# Patient Record
Sex: Female | Born: 1955 | Race: Black or African American | Hispanic: No | Marital: Married | Smoking: Former smoker
Health system: Southern US, Community
[De-identification: ages and names within clinical notes are randomized; demographics above are authoritative.]

## PROBLEM LIST (undated history)

## (undated) DIAGNOSIS — N8110 Cystocele, unspecified: Secondary | ICD-10-CM

## (undated) DIAGNOSIS — R519 Headache, unspecified: Secondary | ICD-10-CM

## (undated) DIAGNOSIS — E785 Hyperlipidemia, unspecified: Secondary | ICD-10-CM

## (undated) DIAGNOSIS — I10 Essential (primary) hypertension: Secondary | ICD-10-CM

## (undated) DIAGNOSIS — M1990 Unspecified osteoarthritis, unspecified site: Secondary | ICD-10-CM

## (undated) DIAGNOSIS — Z8709 Personal history of other diseases of the respiratory system: Secondary | ICD-10-CM

## (undated) DIAGNOSIS — R51 Headache: Secondary | ICD-10-CM

## (undated) DIAGNOSIS — Z8701 Personal history of pneumonia (recurrent): Secondary | ICD-10-CM

## (undated) DIAGNOSIS — J189 Pneumonia, unspecified organism: Secondary | ICD-10-CM

## (undated) DIAGNOSIS — F419 Anxiety disorder, unspecified: Secondary | ICD-10-CM

## (undated) DIAGNOSIS — E119 Type 2 diabetes mellitus without complications: Secondary | ICD-10-CM

## (undated) DIAGNOSIS — I499 Cardiac arrhythmia, unspecified: Secondary | ICD-10-CM

## (undated) DIAGNOSIS — K219 Gastro-esophageal reflux disease without esophagitis: Secondary | ICD-10-CM

## (undated) DIAGNOSIS — D649 Anemia, unspecified: Secondary | ICD-10-CM

## (undated) DIAGNOSIS — N189 Chronic kidney disease, unspecified: Secondary | ICD-10-CM

## (undated) DIAGNOSIS — I2510 Atherosclerotic heart disease of native coronary artery without angina pectoris: Secondary | ICD-10-CM

## (undated) HISTORY — PX: TUBAL LIGATION: SHX77

## (undated) HISTORY — PX: OTHER SURGICAL HISTORY: SHX169

## (undated) HISTORY — DX: Atherosclerotic heart disease of native coronary artery without angina pectoris: I25.10

## (undated) HISTORY — PX: COLONOSCOPY: SHX174

---

## 1990-05-29 HISTORY — PX: ABDOMINAL HYSTERECTOMY: SHX81

## 2014-07-15 NOTE — Progress Notes (Signed)
Please put orders in Epic surgery 07-30-14 pre op 07-21-14 Thanks

## 2014-07-20 ENCOUNTER — Ambulatory Visit: Payer: Self-pay | Admitting: Orthopedic Surgery

## 2014-07-20 MED ORDER — DEXAMETHASONE SODIUM PHOSPHATE 4 MG/ML IJ SOLN: 4.0000 mg | Freq: Once | INTRAMUSCULAR | Status: AC

## 2014-07-20 MED ORDER — SODIUM CHLORIDE 0.9 % IV SOLN: 4.0000 mg | Freq: Once | INTRAVENOUS | Status: AC

## 2014-07-20 MED ORDER — SODIUM CHLORIDE 0.9 % IV SOLN: 1000.0000 mg | INTRAVENOUS | Status: AC

## 2014-07-20 NOTE — Patient Instructions (Addendum)
Cassidy MartinetSusan Green  07/20/2014   Your procedure is scheduled on: 07/30/14   [facility] and follow signs to               Short Stay Center at 11:00 AM.   Call this number if you have problems the morning of surgery [phone]   Remember:  Do not eat food :After Midnight.             MAY HAVE CLEAR LIQUIDS UNTIL 7:00 AM    CLEAR LIQUID DIET   Foods Allowed                                                                     Foods Excluded  Coffee and tea, regular and decaf                             liquids that you cannot  Plain Jell-O in any flavor                                             see through such as: Fruit ices (not with fruit pulp)                                     milk, soups, orange juice  Iced Popsicles                                                All solid food Carbonated beverages, regular and diet                                    Cranberry, grape and apple juices Sports drinks like Gatorade Lightly seasoned clear broth or consume(fat free) Sugar, honey syrup _____________________________________________________________________       Take these medicines the morning of surgery with A SIP OF WATER: MAY TAKE HYDROCODONE OR XANAX IF NEEDED                                You may not have any metal on your body including hair pins and              piercings  Do not wear jewelry, make-up, lotions, powders or perfumes.             Do not wear nail polish.  Do not shave  48 hours prior to surgery.              Men may shave face and neck.   Do not bring valuables to the hospital. [facility] IS NOT             RESPONSIBLE  FOR VALUABLES.  Contacts, dentures or bridgework may not be worn into surgery.  Leave suitcase in the car. After surgery it may be brought to your room.     Patients discharged the day of surgery will not be allowed to drive home.  Name and phone number of your driver:  Special  Instructions: N/A              Please read over the following fact sheets you were given: _____________________________________________________________________                                                     [place]  Before surgery, you can play an important role.  Because skin is not sterile, your skin needs to be as free of germs as possible.  You can reduce the number of germs on your skin by washing with CHG (chlorahexidine gluconate) soap before surgery.  CHG is an antiseptic cleaner which kills germs and bonds with the skin to continue killing germs even after washing. Please DO NOT use if you have an allergy to CHG or antibacterial soaps.  If your skin becomes reddened/irritated stop using the CHG and inform your nurse when you arrive at Short Stay. Do not shave (including legs and underarms) for at least 48 hours prior to the first CHG shower.  You may shave your face. Please follow these instructions carefully:   1.  Shower with CHG Soap the night before surgery and the  morning of Surgery.   2.  If you choose to wash your hair, wash your hair first as usual with your  normal  Shampoo.   3.  After you shampoo, rinse your hair and body thoroughly to remove the  shampoo.                                         4.  Use CHG as you would any other liquid soap.  You can apply chg directly  to the skin and wash . Gently wash with scrungie or clean wascloth    5.  Apply the CHG Soap to your body ONLY FROM THE NECK DOWN.   Do not use on open                           Wound or open sores. Avoid contact with eyes, ears mouth and genitals (private parts).                        Genitals (private parts) with your normal soap.              6.  Wash thoroughly, paying special attention to the area where your surgery  will be performed.   7.  Thoroughly rinse your body with warm water from the neck down.   8.  DO NOT shower/wash with your normal soap after  using and rinsing off  the CHG Soap .                9.  Pat yourself dry with a clean towel.             10.  Wear  clean pajamas.             11.  Place clean sheets on your bed the night of your first shower and do not  sleep with pets.  Day of Surgery : Do not apply any lotions/deodorants the morning of surgery.  Please wear clean clothes to the hospital/surgery center.  FAILURE TO FOLLOW THESE INSTRUCTIONS MAY RESULT IN THE CANCELLATION OF YOUR SURGERY    PATIENT SIGNATURE_________________________________  ______________________________________________________________________     Cassidy Green  An incentive spirometer is a tool that can help keep your lungs clear and active. This tool measures how well you are filling your lungs with each breath. Taking long deep breaths may help reverse or decrease the chance of developing breathing (pulmonary) problems (especially infection) following:  A long period of time when you are unable to move or be active. BEFORE THE PROCEDURE   If the spirometer includes an indicator to show your best effort, your nurse or respiratory therapist will set it to a desired goal.  If possible, sit up straight or lean slightly forward. Try not to slouch.  Hold the incentive spirometer in an upright position. INSTRUCTIONS FOR USE   Sit on the edge of your bed if possible, or sit up as far as you can in bed or on a chair.  Hold the incentive spirometer in an upright position.  Breathe out normally.  Place the mouthpiece in your mouth and seal your lips tightly around it.  Breathe in slowly and as deeply as possible, raising the piston or the ball toward the top of the column.  Hold your breath for 3-5 seconds or for as long as possible. Allow the piston or ball to fall to the bottom of the column.  Remove the mouthpiece from your mouth and breathe out normally.  Rest for a few seconds and repeat Steps 1 through 7 at least 10 times  every 1-2 hours when you are awake. Take your time and take a few normal breaths between deep breaths.  The spirometer may include an indicator to show your best effort. Use the indicator as a goal to work toward during each repetition.  After each set of 10 deep breaths, practice coughing to be sure your lungs are clear. If you have an incision (the cut made at the time of surgery), support your incision when coughing by placing a pillow or rolled up towels firmly against it. Once you are able to get out of bed, walk around indoors and cough well. You may stop using the incentive spirometer when instructed by your caregiver.  RISKS AND COMPLICATIONS  Take your time so you do not get dizzy or light-headed.  If you are in pain, you may need to take or ask for pain medication before doing incentive spirometry. It is harder to take a deep breath if you are having pain. AFTER USE  Rest and breathe slowly and easily.  It can be helpful to keep track of a log of your progress. Your caregiver can provide you with a simple table to help with this. If you are using the spirometer at home, follow these instructions: [place] IF:   You are having difficultly using the spirometer.  You have trouble using the spirometer as often as instructed.  Your pain medication is not giving enough relief while using the spirometer.  You develop fever of 100.5 F (38.1 C) or higher. SEEK IMMEDIATE MEDICAL CARE IF:   You cough up bloody  sputum that had not been present before.  You develop fever of 102 F (38.9 C) or greater.  You develop worsening pain at or near the incision site. MAKE SURE YOU:   Understand these instructions.  Will watch your condition.  Will get help right away if you are not doing well or get worse. Document Released: 09/25/2006 Document Revised: 08/07/2011 Document Reviewed: 11/26/2006 ExitCare Patient Information 2014 ExitCare,  [place].   ________________________________________________________________________  WHAT IS A BLOOD TRANSFUSION? Blood Transfusion Information  A transfusion is the replacement of blood or some of its parts. Blood is made up of multiple cells which provide different functions.  Red blood cells carry oxygen and are used for blood loss replacement.  White blood cells fight against infection.  Platelets control bleeding.  Plasma helps clot blood.  Other blood products are available for specialized needs, such as hemophilia or other clotting disorders. BEFORE THE TRANSFUSION  Who gives blood for transfusions?   Healthy volunteers who are fully evaluated to make sure their blood is safe. This is blood bank blood. Transfusion therapy is the safest it has ever been in the practice of medicine. Before blood is taken from a donor, a complete history is taken to make sure that person has no history of diseases nor engages in risky social behavior (examples are intravenous drug use or sexual activity with multiple partners). The donor's travel history is screened to minimize risk of transmitting infections, such as malaria. The donated blood is tested for signs of infectious diseases, such as HIV and hepatitis. The blood is then tested to be sure it is compatible with you in order to minimize the chance of a transfusion reaction. If you or a relative donates blood, this is often done in anticipation of surgery and is not appropriate for emergency situations. It takes many days to process the donated blood. RISKS AND COMPLICATIONS Although transfusion therapy is very safe and saves many lives, the main dangers of transfusion include:   Getting an infectious disease.  Developing a transfusion reaction. This is an allergic reaction to something in the blood you were given. Every precaution is taken to prevent this. The decision to have a blood transfusion has been considered carefully by your caregiver  before blood is given. Blood is not given unless the benefits outweigh the risks. AFTER THE TRANSFUSION  Right after receiving a blood transfusion, you will usually feel much better and more energetic. This is especially true if your red blood cells have gotten low (anemic). The transfusion raises the level of the red blood cells which carry oxygen, and this usually causes an energy increase.  The nurse administering the transfusion will monitor you carefully for complications. HOME CARE INSTRUCTIONS  No special instructions are needed after a transfusion. You may find your energy is better. Speak with your caregiver about any limitations on activity for underlying diseases you may have. SEEK MEDICAL CARE IF:   Your condition is not improving after your transfusion.  You develop redness or irritation at the intravenous (IV) site. SEEK IMMEDIATE MEDICAL CARE IF:  Any of the following symptoms occur over the next 12 hours:  Shaking chills.  You have a temperature by mouth above 102 F (38.9 C), not controlled by medicine.  Chest, back, or muscle pain.  People around you feel you are not acting correctly or are confused.  Shortness of breath or difficulty breathing.  Dizziness and fainting.  You get a rash or develop hives.  You have a  decrease in urine output.  Your urine turns a dark color or changes to pink, red, or brown. Any of the following symptoms occur over the next 10 days:  You have a temperature by mouth above 102 F (38.9 C), not controlled by medicine.  Shortness of breath.  Weakness after normal activity.  The white part of the eye turns yellow (jaundice).  You have a decrease in the amount of urine or are urinating less often.  Your urine turns a dark color or changes to pink, red, or brown. Document Released: 05/12/2000 Document Revised: 08/07/2011 Document Reviewed: 12/30/2007 [facility] Patient Information 2014 [place],  [place].  _______________________________________________________________________

## 2014-07-21 ENCOUNTER — Other Ambulatory Visit: Payer: Self-pay

## 2014-07-21 ENCOUNTER — Encounter (HOSPITAL_COMMUNITY)
Admission: RE | Admit: 2014-07-21 | Discharge: 2014-07-21 | Disposition: A | Discharge status: Home / Self Care | Payer: BLUE CROSS/BLUE SHIELD | Source: Ambulatory Visit | Attending: Orthopedic Surgery | Admitting: Orthopedic Surgery

## 2014-07-21 ENCOUNTER — Encounter (HOSPITAL_COMMUNITY): Payer: Self-pay

## 2014-07-21 DIAGNOSIS — M1612 Unilateral primary osteoarthritis, left hip: Secondary | ICD-10-CM | POA: Diagnosis not present

## 2014-07-21 DIAGNOSIS — Z01818 Encounter for other preprocedural examination: Secondary | ICD-10-CM | POA: Diagnosis present

## 2014-07-21 HISTORY — DX: Type 2 diabetes mellitus without complications: E11.9

## 2014-07-21 HISTORY — DX: Anxiety disorder, unspecified: F41.9

## 2014-07-21 HISTORY — DX: Essential (primary) hypertension: I10

## 2014-07-21 HISTORY — DX: Anemia, unspecified: D64.9

## 2014-07-21 HISTORY — DX: Hyperlipidemia, unspecified: E78.5

## 2014-07-21 HISTORY — DX: Unspecified osteoarthritis, unspecified site: M19.90

## 2014-07-21 HISTORY — DX: Gastro-esophageal reflux disease without esophagitis: K21.9

## 2014-07-21 LAB — COMPREHENSIVE METABOLIC PANEL
ALT: 12 U/L (ref 0–35)
AST: 27 U/L (ref 0–37)
Albumin: 4.4 g/dL (ref 3.5–5.2)
Alkaline Phosphatase: 75 U/L (ref 39–117)
Anion gap: 5 (ref 5–15)
BUN: 28 mg/dL — AB (ref 6–23)
CHLORIDE: 117 mmol/L — AB (ref 96–112)
CO2: 19 mmol/L (ref 19–32)
Calcium: 9.5 mg/dL (ref 8.4–10.5)
Creatinine, Ser: 1.78 mg/dL — ABNORMAL HIGH (ref 0.50–1.10)
GFR, EST AFRICAN AMERICAN: 35 mL/min — AB (ref >90)
GFR, EST NON AFRICAN AMERICAN: 30 mL/min — AB (ref >90)
Glucose, Bld: 89 mg/dL (ref 70–99)
POTASSIUM: 4.6 mmol/L (ref 3.5–5.1)
Sodium: 141 mmol/L (ref 135–145)
Total Bilirubin: 0.3 mg/dL (ref 0.3–1.2)
Total Protein: 8.2 g/dL (ref 6.0–8.3)

## 2014-07-21 LAB — URINE MICROSCOPIC-ADD ON

## 2014-07-21 LAB — URINALYSIS, ROUTINE W REFLEX MICROSCOPIC
Bilirubin Urine: NEGATIVE
Glucose, UA: NEGATIVE mg/dL
HGB URINE DIPSTICK: NEGATIVE
Ketones, ur: NEGATIVE mg/dL
Leukocytes, UA: NEGATIVE
Nitrite: NEGATIVE
PROTEIN: 30 mg/dL — AB
SPECIFIC GRAVITY, URINE: 1.026 (ref 1.005–1.030)
UROBILINOGEN UA: 0.2 mg/dL (ref 0.0–1.0)
pH: 5.5 (ref 5.0–8.0)

## 2014-07-21 LAB — PROTIME-INR
INR: 1.05 (ref 0.00–1.49)
PROTHROMBIN TIME: 13.8 s (ref 11.6–15.2)

## 2014-07-21 LAB — SURGICAL PCR SCREEN
MRSA, PCR: NEGATIVE
Staphylococcus aureus: NEGATIVE

## 2014-07-21 LAB — CBC
HCT: 35.5 % — ABNORMAL LOW (ref 36.0–46.0)
Hemoglobin: 10.9 g/dL — ABNORMAL LOW (ref 12.0–15.0)
MCH: 27.2 pg (ref 26.0–34.0)
MCHC: 30.7 g/dL (ref 30.0–36.0)
MCV: 88.5 fL (ref 78.0–100.0)
Platelets: 254 10*3/uL (ref 150–400)
RBC: 4.01 MIL/uL (ref 3.87–5.11)
RDW: 15.8 % — ABNORMAL HIGH (ref 11.5–15.5)
WBC: 8.2 10*3/uL (ref 4.0–10.5)

## 2014-07-21 LAB — APTT: APTT: 32 s (ref 24–37)

## 2014-07-21 NOTE — Progress Notes (Signed)
MICRO, UA RESULTS FAXED TO DR Linna CapriceSWINTECK BY EPIC

## 2014-07-21 NOTE — Progress Notes (Signed)
DENTAL CLEARANCE NOTE DR RITTER ON CHART FOR 07-30-14 SURGERY. MEDICAL CLEARANCE NOTE HAGUE ON CHART FOR 07-30-14 SURGERY ON CHART

## 2014-07-22 ENCOUNTER — Ambulatory Visit: Payer: Self-pay | Admitting: Orthopedic Surgery

## 2014-07-22 NOTE — H&P (Signed)
TOTAL HIP ADMISSION H&P  Patient is admitted for left total hip arthroplasty.  Subjective:  Chief Complaint: left hip pain  HPI: Cassidy Green, 59 y.o. female, has a history of pain and functional disability in the left hip(s) due to arthritis and patient has failed non-surgical conservative treatments for greater than 12 weeks to include NSAID's and/or analgesics, flexibility and strengthening excercises, use of assistive devices and activity modification.  Onset of symptoms was gradual starting 5 years ago with gradually worsening course since that time.The patient noted no past surgery on the left hip(s).  Patient currently rates pain in the left hip at 10 out of 10 with activity. Patient has night pain, worsening of pain with activity and weight bearing, pain that interfers with activities of daily living and pain with passive range of motion. 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 This condition presents safety issues increasing the risk of falls. There is no current active infection.  There are no active problems to display for this patient.  Past Medical History  Diagnosis Date  . Hypertension   . Hyperlipidemia   . Arthritis   . GERD (gastroesophageal reflux disease)   . Anemia   . Diabetes mellitus without complication   . Anxiety     Past Surgical History  Procedure Laterality Date  . Abdominal hysterectomy  1992  . Tubal ligation       (Not in a hospital admission) No Known Allergies  History  Substance Use Topics  . Smoking status: Former Smoker    Quit date: 07/21/2001  . Smokeless tobacco: Not on file  . Alcohol Use: Yes     Comment: OCCASIONAL    No family history on file.   Review of Systems  Constitutional: Negative.   HENT: Negative.   Eyes: Negative.   Respiratory: Negative.  Negative for shortness of breath.   Cardiovascular:  Negative.  Negative for chest pain.  Gastrointestinal: Negative.   Genitourinary: Negative.   Musculoskeletal: Positive for joint pain. Negative for myalgias, back pain, falls and neck pain.  Skin: Negative.   Neurological: Negative.   Endo/Heme/Allergies: Negative.   Psychiatric/Behavioral: Negative.     Objective:  Physical Exam  Vitals reviewed. Constitutional: She is oriented to person, place, and time. She appears well-developed and well-nourished.  HENT:  Head: Normocephalic and atraumatic.  Eyes: EOM are normal. Pupils are equal, round, and reactive to light.  Neck: Normal range of motion. Neck supple.  Cardiovascular: Normal rate, regular rhythm, normal heart sounds and intact distal pulses.   Respiratory: Effort normal and breath sounds normal.  GI: Soft. Bowel sounds are normal.  Genitourinary:  deferred  Musculoskeletal:       Left hip: She exhibits normal range of motion.  Neurological: She is alert and oriented to person, place, and time. She has normal reflexes.  Skin: Skin is warm and dry.  Psychiatric: She has a normal mood and affect. Her behavior is normal. Judgment and thought content normal.    Vital signs in last 24 hours: @VSRANGES@  Labs:   Estimated body mass index is 23.16 kg/(m^2) as calculated from the following:   Height as of 07/21/14: 5' 4" (1.626 m).   Weight as of 07/21/14: 61.236 kg (135 lb).   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 The bone quality appears to be adequate for age and reported activity level.  Assessment/Plan:    End stage arthritis, left hip(s)  The patient history, physical examination, clinical judgement of the provider and imaging studies are consistent with end stage degenerative joint disease of the left hip(s) and total hip arthroplasty is deemed medically necessary. The treatment options including medical management, injection therapy,  arthroscopy and arthroplasty were discussed at length. The risks and benefits of total hip arthroplasty were presented and reviewed. The risks due to aseptic loosening, infection, stiffness, dislocation/subluxation,  thromboembolic complications and other imponderables were discussed.  The patient acknowledged the explanation, agreed to proceed with the plan and consent was signed. Patient is being admitted for inpatient treatment for surgery, pain control, PT, OT, prophylactic antibiotics, VTE prophylaxis, progressive ambulation and ADL's and discharge planning.The patient is planning to be discharged home with home health services 

## 2014-07-30 ENCOUNTER — Encounter (HOSPITAL_COMMUNITY)
Admission: RE | Disposition: A | Discharge status: Home Health | Payer: Self-pay | Source: Ambulatory Visit | Attending: Orthopedic Surgery

## 2014-07-30 ENCOUNTER — Inpatient Hospital Stay (HOSPITAL_COMMUNITY): Payer: BLUE CROSS/BLUE SHIELD

## 2014-07-30 ENCOUNTER — Inpatient Hospital Stay (HOSPITAL_COMMUNITY): Payer: BLUE CROSS/BLUE SHIELD | Admitting: Anesthesiology

## 2014-07-30 ENCOUNTER — Inpatient Hospital Stay (HOSPITAL_COMMUNITY)
Admission: RE | Admit: 2014-07-30 | Discharge: 2014-08-01 | DRG: 470 | Disposition: A | Discharge status: Home Health | Payer: BLUE CROSS/BLUE SHIELD | Source: Ambulatory Visit | Attending: Orthopedic Surgery | Admitting: Orthopedic Surgery

## 2014-07-30 ENCOUNTER — Encounter (HOSPITAL_COMMUNITY): Payer: Self-pay | Admitting: *Deleted

## 2014-07-30 DIAGNOSIS — D62 Acute posthemorrhagic anemia: Secondary | ICD-10-CM | POA: Diagnosis not present

## 2014-07-30 DIAGNOSIS — I10 Essential (primary) hypertension: Secondary | ICD-10-CM | POA: Diagnosis present

## 2014-07-30 DIAGNOSIS — M247 Protrusio acetabuli: Secondary | ICD-10-CM | POA: Diagnosis present

## 2014-07-30 DIAGNOSIS — E785 Hyperlipidemia, unspecified: Secondary | ICD-10-CM | POA: Diagnosis present

## 2014-07-30 DIAGNOSIS — M1612 Unilateral primary osteoarthritis, left hip: Principal | ICD-10-CM | POA: Diagnosis present

## 2014-07-30 DIAGNOSIS — Z09 Encounter for follow-up examination after completed treatment for conditions other than malignant neoplasm: Secondary | ICD-10-CM

## 2014-07-30 DIAGNOSIS — E119 Type 2 diabetes mellitus without complications: Secondary | ICD-10-CM | POA: Diagnosis present

## 2014-07-30 DIAGNOSIS — Z87891 Personal history of nicotine dependence: Secondary | ICD-10-CM

## 2014-07-30 DIAGNOSIS — K219 Gastro-esophageal reflux disease without esophagitis: Secondary | ICD-10-CM | POA: Diagnosis present

## 2014-07-30 DIAGNOSIS — Z419 Encounter for procedure for purposes other than remedying health state, unspecified: Secondary | ICD-10-CM

## 2014-07-30 DIAGNOSIS — M25552 Pain in left hip: Secondary | ICD-10-CM | POA: Diagnosis present

## 2014-07-30 DIAGNOSIS — Z01812 Encounter for preprocedural laboratory examination: Secondary | ICD-10-CM | POA: Diagnosis not present

## 2014-07-30 HISTORY — PX: TOTAL HIP ARTHROPLASTY: SHX124

## 2014-07-30 LAB — TYPE AND SCREEN
ABO/RH(D): B POS
Antibody Screen: NEGATIVE

## 2014-07-30 LAB — GLUCOSE, CAPILLARY
GLUCOSE-CAPILLARY: 180 mg/dL — AB (ref 70–99)
Glucose-Capillary: 85 mg/dL (ref 70–99)

## 2014-07-30 LAB — ABO/RH: ABO/RH(D): B POS

## 2014-07-30 SURGERY — ARTHROPLASTY, HIP, TOTAL, ANTERIOR APPROACH
Anesthesia: General | Site: Hip | Laterality: Left

## 2014-07-30 MED ORDER — SITAGLIPTIN PHOS-METFORMIN HCL 50-1000 MG PO TABS: 1.0000 | ORAL_TABLET | Freq: Every day | ORAL | Status: DC

## 2014-07-30 MED ORDER — HYDROMORPHONE HCL 1 MG/ML IJ SOLN
INTRAMUSCULAR | Status: AC
  Filled 2014-07-30: qty 1

## 2014-07-30 MED ORDER — NEOSTIGMINE METHYLSULFATE 10 MG/10ML IV SOLN
INTRAVENOUS | Status: DC | PRN
  Administered 2014-07-30: 3 mg via INTRAVENOUS

## 2014-07-30 MED ORDER — EPHEDRINE SULFATE 50 MG/ML IJ SOLN
INTRAMUSCULAR | Status: AC
  Filled 2014-07-30: qty 1

## 2014-07-30 MED ORDER — POVIDONE-IODINE 10 % EX SOLN
CUTANEOUS | Status: DC | PRN
  Administered 2014-07-30: 1 via TOPICAL

## 2014-07-30 MED ORDER — ROCURONIUM BROMIDE 100 MG/10ML IV SOLN
INTRAVENOUS | Status: DC | PRN
  Administered 2014-07-30: 40 mg via INTRAVENOUS
  Administered 2014-07-30: 10 mg via INTRAVENOUS

## 2014-07-30 MED ORDER — ONDANSETRON HCL 4 MG/2ML IJ SOLN
INTRAMUSCULAR | Status: DC | PRN
  Administered 2014-07-30 (×2): 4 mg via INTRAVENOUS

## 2014-07-30 MED ORDER — SODIUM CHLORIDE 0.9 % IR SOLN
Status: DC | PRN
  Administered 2014-07-30: 4000 mL

## 2014-07-30 MED ORDER — EZETIMIBE 10 MG PO TABS
10.0000 mg | ORAL_TABLET | Freq: Every day | ORAL | Status: DC
  Administered 2014-07-30 – 2014-07-31 (×2): 10 mg via ORAL
  Filled 2014-07-30 (×3): qty 1

## 2014-07-30 MED ORDER — ISOPROPYL ALCOHOL 70 % SOLN
Status: DC | PRN
  Administered 2014-07-30: 1 via TOPICAL

## 2014-07-30 MED ORDER — TRANEXAMIC ACID 100 MG/ML IV SOLN
1000.0000 mg | INTRAVENOUS | Status: DC
  Filled 2014-07-30: qty 10

## 2014-07-30 MED ORDER — GLYCOPYRROLATE 0.2 MG/ML IJ SOLN
INTRAMUSCULAR | Status: AC
  Filled 2014-07-30: qty 2

## 2014-07-30 MED ORDER — LACTATED RINGERS IV SOLN
INTRAVENOUS | Status: DC
  Administered 2014-07-30: 1000 mL via INTRAVENOUS

## 2014-07-30 MED ORDER — KETOROLAC TROMETHAMINE 15 MG/ML IJ SOLN
INTRAMUSCULAR | Status: AC
  Filled 2014-07-30: qty 1

## 2014-07-30 MED ORDER — ONDANSETRON HCL 4 MG/2ML IJ SOLN
INTRAMUSCULAR | Status: AC
  Filled 2014-07-30: qty 2

## 2014-07-30 MED ORDER — AMOXICILLIN 500 MG PO CAPS
500.0000 mg | ORAL_CAPSULE | Freq: Four times a day (QID) | ORAL | Status: DC
  Administered 2014-07-31 – 2014-08-01 (×5): 500 mg via ORAL
  Filled 2014-07-30 (×8): qty 1

## 2014-07-30 MED ORDER — MIDAZOLAM HCL 5 MG/5ML IJ SOLN
INTRAMUSCULAR | Status: DC | PRN
  Administered 2014-07-30: 2 mg via INTRAVENOUS

## 2014-07-30 MED ORDER — CEFAZOLIN SODIUM 1-5 GM-% IV SOLN
1.0000 g | Freq: Four times a day (QID) | INTRAVENOUS | Status: AC
  Administered 2014-07-30 – 2014-07-31 (×2): 1 g via INTRAVENOUS
  Filled 2014-07-30 (×2): qty 50

## 2014-07-30 MED ORDER — DEXAMETHASONE SODIUM PHOSPHATE 10 MG/ML IJ SOLN
INTRAMUSCULAR | Status: AC
  Filled 2014-07-30: qty 1

## 2014-07-30 MED ORDER — HYDROMORPHONE HCL 1 MG/ML IJ SOLN
INTRAMUSCULAR | Status: DC | PRN
  Administered 2014-07-30 (×2): 1 mg via INTRAVENOUS

## 2014-07-30 MED ORDER — HYDROMORPHONE HCL 2 MG/ML IJ SOLN
INTRAMUSCULAR | Status: AC
  Filled 2014-07-30: qty 1

## 2014-07-30 MED ORDER — METHOCARBAMOL 500 MG PO TABS
500.0000 mg | ORAL_TABLET | Freq: Four times a day (QID) | ORAL | Status: DC | PRN
  Administered 2014-07-31 – 2014-08-01 (×3): 500 mg via ORAL
  Filled 2014-07-30 (×3): qty 1

## 2014-07-30 MED ORDER — FENTANYL CITRATE 0.05 MG/ML IJ SOLN
INTRAMUSCULAR | Status: AC
  Filled 2014-07-30: qty 2

## 2014-07-30 MED ORDER — CEFAZOLIN SODIUM-DEXTROSE 2-3 GM-% IV SOLR
INTRAVENOUS | Status: AC
  Filled 2014-07-30: qty 50

## 2014-07-30 MED ORDER — LINAGLIPTIN 5 MG PO TABS
5.0000 mg | ORAL_TABLET | Freq: Every day | ORAL | Status: DC
Start: 2014-07-30 — End: 2014-08-01
  Administered 2014-07-30 – 2014-07-31 (×2): 5 mg via ORAL
  Filled 2014-07-30 (×3): qty 1

## 2014-07-30 MED ORDER — ROCURONIUM BROMIDE 100 MG/10ML IV SOLN
INTRAVENOUS | Status: AC
  Filled 2014-07-30: qty 1

## 2014-07-30 MED ORDER — ONDANSETRON HCL 4 MG/2ML IJ SOLN: 4.0000 mg | Freq: Four times a day (QID) | INTRAMUSCULAR | Status: DC | PRN

## 2014-07-30 MED ORDER — FENTANYL CITRATE 0.05 MG/ML IJ SOLN
INTRAMUSCULAR | Status: AC
  Filled 2014-07-30: qty 5

## 2014-07-30 MED ORDER — METHOCARBAMOL 1000 MG/10ML IJ SOLN
500.0000 mg | Freq: Four times a day (QID) | INTRAVENOUS | Status: DC | PRN
  Administered 2014-07-30: 500 mg via INTRAVENOUS
  Filled 2014-07-30 (×2): qty 5

## 2014-07-30 MED ORDER — HYDROCODONE-ACETAMINOPHEN 7.5-325 MG PO TABS
1.0000 | ORAL_TABLET | ORAL | Status: DC | PRN
  Administered 2014-07-31 (×2): 2 via ORAL
  Administered 2014-07-31: 1 via ORAL
  Administered 2014-07-31: 2 via ORAL
  Administered 2014-07-31: 1 via ORAL
  Administered 2014-08-01 (×2): 2 via ORAL
  Filled 2014-07-30 (×2): qty 1
  Filled 2014-07-30 (×5): qty 2

## 2014-07-30 MED ORDER — ACETAMINOPHEN 325 MG PO TABS: 650.0000 mg | ORAL_TABLET | Freq: Four times a day (QID) | ORAL | Status: DC | PRN

## 2014-07-30 MED ORDER — GLYCOPYRROLATE 0.2 MG/ML IJ SOLN
INTRAMUSCULAR | Status: DC | PRN
  Administered 2014-07-30: 0.4 mg via INTRAVENOUS

## 2014-07-30 MED ORDER — HYDROGEN PEROXIDE 3 % EX SOLN
CUTANEOUS | Status: AC
  Filled 2014-07-30: qty 473

## 2014-07-30 MED ORDER — CEFAZOLIN SODIUM-DEXTROSE 2-3 GM-% IV SOLR
2.0000 g | INTRAVENOUS | Status: AC
  Administered 2014-07-30: 2 g via INTRAVENOUS

## 2014-07-30 MED ORDER — HYDROMORPHONE HCL 1 MG/ML IJ SOLN
0.2500 mg | INTRAMUSCULAR | Status: DC | PRN
  Administered 2014-07-30 (×2): 0.5 mg via INTRAVENOUS

## 2014-07-30 MED ORDER — FOLIC ACID 1 MG PO TABS
1.0000 mg | ORAL_TABLET | Freq: Every day | ORAL | Status: DC
  Administered 2014-07-30 – 2014-07-31 (×2): 1 mg via ORAL
  Filled 2014-07-30 (×3): qty 1

## 2014-07-30 MED ORDER — LACTATED RINGERS IV SOLN: INTRAVENOUS | Status: DC

## 2014-07-30 MED ORDER — ALPRAZOLAM 0.25 MG PO TABS: 0.2500 mg | ORAL_TABLET | Freq: Every day | ORAL | Status: DC | PRN

## 2014-07-30 MED ORDER — BUPIVACAINE-EPINEPHRINE 0.25% -1:200000 IJ SOLN
INTRAMUSCULAR | Status: DC | PRN
  Administered 2014-07-30: 30 mL

## 2014-07-30 MED ORDER — ONDANSETRON HCL 4 MG/2ML IJ SOLN: 4.0000 mg | Freq: Once | INTRAMUSCULAR | Status: DC | PRN

## 2014-07-30 MED ORDER — DEXAMETHASONE SODIUM PHOSPHATE 10 MG/ML IJ SOLN
INTRAMUSCULAR | Status: DC | PRN
  Administered 2014-07-30: 10 mg via INTRAVENOUS

## 2014-07-30 MED ORDER — SENNA 8.6 MG PO TABS
1.0000 | ORAL_TABLET | Freq: Two times a day (BID) | ORAL | Status: DC
  Administered 2014-07-30 – 2014-08-01 (×5): 8.6 mg via ORAL
  Filled 2014-07-30: qty 1

## 2014-07-30 MED ORDER — PROPOFOL 10 MG/ML IV BOLUS
INTRAVENOUS | Status: AC
  Filled 2014-07-30: qty 20

## 2014-07-30 MED ORDER — ONDANSETRON HCL 4 MG PO TABS: 4.0000 mg | ORAL_TABLET | Freq: Four times a day (QID) | ORAL | Status: DC | PRN

## 2014-07-30 MED ORDER — LISINOPRIL 20 MG PO TABS
20.0000 mg | ORAL_TABLET | Freq: Every day | ORAL | Status: DC
  Administered 2014-07-30 – 2014-07-31 (×2): 20 mg via ORAL
  Filled 2014-07-30 (×3): qty 1

## 2014-07-30 MED ORDER — HYDROMORPHONE HCL 1 MG/ML IJ SOLN: 0.5000 mg | INTRAMUSCULAR | Status: DC | PRN

## 2014-07-30 MED ORDER — LACTATED RINGERS IV SOLN
INTRAVENOUS | Status: DC
  Administered 2014-07-31: 12:00:00 via INTRAVENOUS
  Administered 2014-07-31: 125 mL via INTRAVENOUS

## 2014-07-30 MED ORDER — 0.9 % SODIUM CHLORIDE (POUR BTL) OPTIME
TOPICAL | Status: DC | PRN
  Administered 2014-07-30: 500 mL

## 2014-07-30 MED ORDER — ENOXAPARIN SODIUM 40 MG/0.4ML ~~LOC~~ SOLN
40.0000 mg | SUBCUTANEOUS | Status: DC
  Administered 2014-07-31 – 2014-08-01 (×2): 40 mg via SUBCUTANEOUS
  Filled 2014-07-30 (×2): qty 0.4

## 2014-07-30 MED ORDER — HYDROGEN PEROXIDE 3 % EX SOLN
CUTANEOUS | Status: DC | PRN
  Administered 2014-07-30: 1

## 2014-07-30 MED ORDER — LACTATED RINGERS IV SOLN
INTRAVENOUS | Status: DC | PRN
  Administered 2014-07-30 (×3): via INTRAVENOUS

## 2014-07-30 MED ORDER — PROPOFOL 10 MG/ML IV BOLUS
INTRAVENOUS | Status: DC | PRN
  Administered 2014-07-30: 150 mg via INTRAVENOUS

## 2014-07-30 MED ORDER — INSULIN ASPART 100 UNIT/ML ~~LOC~~ SOLN
0.0000 [IU] | Freq: Three times a day (TID) | SUBCUTANEOUS | Status: DC
  Administered 2014-07-31: 2 [IU] via SUBCUTANEOUS
  Administered 2014-07-31: 3 [IU] via SUBCUTANEOUS
  Administered 2014-07-31: 2 [IU] via SUBCUTANEOUS

## 2014-07-30 MED ORDER — FENTANYL CITRATE 0.05 MG/ML IJ SOLN
INTRAMUSCULAR | Status: DC | PRN
  Administered 2014-07-30 (×3): 50 ug via INTRAVENOUS
  Administered 2014-07-30 (×2): 100 ug via INTRAVENOUS

## 2014-07-30 MED ORDER — KETOROLAC TROMETHAMINE 30 MG/ML IJ SOLN
INTRAMUSCULAR | Status: DC | PRN
  Administered 2014-07-30: 30 mg via INTRAMUSCULAR

## 2014-07-30 MED ORDER — NEOSTIGMINE METHYLSULFATE 10 MG/10ML IV SOLN
INTRAVENOUS | Status: AC
  Filled 2014-07-30: qty 1

## 2014-07-30 MED ORDER — FERROUS FUMARATE 325 (106 FE) MG PO TABS
1.0000 | ORAL_TABLET | Freq: Two times a day (BID) | ORAL | Status: DC
  Administered 2014-07-30 – 2014-08-01 (×4): 106 mg via ORAL
  Filled 2014-07-30 (×7): qty 1

## 2014-07-30 MED ORDER — ISOPROPYL ALCOHOL 70 % SOLN
Status: AC
  Filled 2014-07-30: qty 480

## 2014-07-30 MED ORDER — SODIUM CHLORIDE 0.9 % IJ SOLN
INTRAMUSCULAR | Status: AC
  Filled 2014-07-30: qty 10

## 2014-07-30 MED ORDER — SODIUM CHLORIDE 0.9 % IJ SOLN
INTRAMUSCULAR | Status: AC
  Filled 2014-07-30: qty 50

## 2014-07-30 MED ORDER — CHLORHEXIDINE GLUCONATE 4 % EX LIQD: 60.0000 mL | Freq: Once | CUTANEOUS | Status: DC

## 2014-07-30 MED ORDER — ESMOLOL HCL 10 MG/ML IV SOLN
INTRAVENOUS | Status: AC
  Filled 2014-07-30: qty 10

## 2014-07-30 MED ORDER — PHENOL 1.4 % MT LIQD
1.0000 | OROMUCOSAL | Status: DC | PRN
  Filled 2014-07-30: qty 177

## 2014-07-30 MED ORDER — MENTHOL 3 MG MT LOZG
1.0000 | LOZENGE | OROMUCOSAL | Status: DC | PRN
  Filled 2014-07-30: qty 9

## 2014-07-30 MED ORDER — ONDANSETRON HCL 4 MG/2ML IJ SOLN
INTRAMUSCULAR | Status: AC
  Filled 2014-07-30: qty 4

## 2014-07-30 MED ORDER — METOCLOPRAMIDE HCL 5 MG/ML IJ SOLN: 5.0000 mg | Freq: Three times a day (TID) | INTRAMUSCULAR | Status: DC | PRN

## 2014-07-30 MED ORDER — BUPIVACAINE-EPINEPHRINE (PF) 0.25% -1:200000 IJ SOLN
INTRAMUSCULAR | Status: AC
  Filled 2014-07-30: qty 30

## 2014-07-30 MED ORDER — ACETAMINOPHEN 650 MG RE SUPP: 650.0000 mg | Freq: Four times a day (QID) | RECTAL | Status: DC | PRN

## 2014-07-30 MED ORDER — KETOROLAC TROMETHAMINE 30 MG/ML IJ SOLN
INTRAMUSCULAR | Status: AC
  Filled 2014-07-30: qty 1

## 2014-07-30 MED ORDER — ESMOLOL HCL 10 MG/ML IV SOLN
INTRAVENOUS | Status: DC | PRN
  Administered 2014-07-30: 10 mg via INTRAVENOUS

## 2014-07-30 MED ORDER — SODIUM CHLORIDE 0.9 % IV SOLN: INTRAVENOUS | Status: DC

## 2014-07-30 MED ORDER — PANTOPRAZOLE SODIUM 40 MG PO TBEC
40.0000 mg | DELAYED_RELEASE_TABLET | Freq: Every day | ORAL | Status: DC
  Administered 2014-07-30 – 2014-08-01 (×3): 40 mg via ORAL
  Filled 2014-07-30 (×4): qty 1

## 2014-07-30 MED ORDER — DOCUSATE SODIUM 100 MG PO CAPS
100.0000 mg | ORAL_CAPSULE | Freq: Two times a day (BID) | ORAL | Status: DC
  Administered 2014-07-30 – 2014-08-01 (×4): 100 mg via ORAL

## 2014-07-30 MED ORDER — METOCLOPRAMIDE HCL 10 MG PO TABS: 5.0000 mg | ORAL_TABLET | Freq: Three times a day (TID) | ORAL | Status: DC | PRN

## 2014-07-30 MED ORDER — LIDOCAINE HCL (CARDIAC) 20 MG/ML IV SOLN
INTRAVENOUS | Status: DC | PRN
  Administered 2014-07-30: 50 mg via INTRAVENOUS

## 2014-07-30 MED ORDER — PHENYLEPHRINE HCL 10 MG/ML IJ SOLN
INTRAMUSCULAR | Status: DC | PRN
  Administered 2014-07-30: 40 ug via INTRAVENOUS
  Administered 2014-07-30 (×3): 80 ug via INTRAVENOUS
  Administered 2014-07-30: 40 ug via INTRAVENOUS

## 2014-07-30 MED ORDER — SODIUM CHLORIDE 0.9 % IR SOLN
Status: DC | PRN
  Administered 2014-07-30: 1000 mL

## 2014-07-30 MED ORDER — KETOROLAC TROMETHAMINE 15 MG/ML IJ SOLN
15.0000 mg | Freq: Four times a day (QID) | INTRAMUSCULAR | Status: AC
  Administered 2014-07-30 – 2014-07-31 (×4): 15 mg via INTRAVENOUS
  Filled 2014-07-30 (×4): qty 1

## 2014-07-30 MED ORDER — MIDAZOLAM HCL 2 MG/2ML IJ SOLN
INTRAMUSCULAR | Status: AC
  Filled 2014-07-30: qty 2

## 2014-07-30 MED ORDER — SODIUM CHLORIDE 0.9 % IJ SOLN
INTRAMUSCULAR | Status: DC | PRN
  Administered 2014-07-30: 30 mL

## 2014-07-30 SURGICAL SUPPLY — 44 items
BAG ZIPLOCK 12X15 (MISCELLANEOUS) ×3 IMPLANT
BLADE 10 SAFETY STRL DISP (BLADE) ×3 IMPLANT
BUR RND DIAM 4.0 (BURR) ×3 IMPLANT
CAPT HIP TOTAL 2 ×3 IMPLANT
CHLORAPREP W/TINT 26ML (MISCELLANEOUS) ×3 IMPLANT
COVER PERINEAL POST (MISCELLANEOUS) ×3 IMPLANT
DRAPE C-ARM 42X120 X-RAY (DRAPES) ×3 IMPLANT
DRAPE STERI IOBAN 125X83 (DRAPES) ×3 IMPLANT
DRAPE U-SHAPE 47X51 STRL (DRAPES) ×9 IMPLANT
DRSG AQUACEL AG ADV 3.5X10 (GAUZE/BANDAGES/DRESSINGS) ×3 IMPLANT
ELECT BLADE TIP CTD 4 INCH (ELECTRODE) ×3 IMPLANT
ELECT PENCIL ROCKER SW 15FT (MISCELLANEOUS) ×3 IMPLANT
ELECT REM PT RETURN 15FT ADLT (MISCELLANEOUS) IMPLANT
ELECT REM PT RETURN 9FT ADLT (ELECTROSURGICAL) ×3
ELECTRODE REM PT RTRN 9FT ADLT (ELECTROSURGICAL) ×1 IMPLANT
FACESHIELD WRAPAROUND (MASK) ×9 IMPLANT
GLOVE BIO SURGEON STRL SZ8.5 (GLOVE) ×6 IMPLANT
GLOVE BIOGEL PI IND STRL 8.5 (GLOVE) ×1 IMPLANT
GLOVE BIOGEL PI INDICATOR 8.5 (GLOVE) ×2
GOWN SPEC L3 XXLG W/TWL (GOWN DISPOSABLE) ×3 IMPLANT
HANDPIECE INTERPULSE COAX TIP (DISPOSABLE) ×2
HOLDER FOLEY CATH W/STRAP (MISCELLANEOUS) ×3 IMPLANT
HOOD PEEL AWAY FACE SHEILD DIS (HOOD) ×6 IMPLANT
KIT BASIN OR (CUSTOM PROCEDURE TRAY) ×3 IMPLANT
LIQUID BAND (GAUZE/BANDAGES/DRESSINGS) ×6 IMPLANT
NDL SAFETY ECLIPSE 18X1.5 (NEEDLE) ×1 IMPLANT
NEEDLE HYPO 18GX1.5 SHARP (NEEDLE) ×2
PACK TOTAL JOINT (CUSTOM PROCEDURE TRAY) ×3 IMPLANT
SAW OSC TIP CART 19.5X105X1.3 (SAW) ×3 IMPLANT
SEALER BIPOLAR AQUA 6.0 (INSTRUMENTS) ×3 IMPLANT
SET HNDPC FAN SPRY TIP SCT (DISPOSABLE) ×1 IMPLANT
SOL PREP POV-IOD 4OZ 10% (MISCELLANEOUS) ×3 IMPLANT
SUT ETHIBOND NAB CT1 #1 30IN (SUTURE) ×6 IMPLANT
SUT MNCRL AB 3-0 PS2 18 (SUTURE) ×3 IMPLANT
SUT MON AB 2-0 SH 27 (SUTURE) ×4
SUT MON AB 2-0 SH27 (SUTURE) ×2 IMPLANT
SUT VIC AB 1 CT1 36 (SUTURE) ×3 IMPLANT
SUT VIC AB 2-0 CT1 27 (SUTURE) ×2
SUT VIC AB 2-0 CT1 TAPERPNT 27 (SUTURE) ×1 IMPLANT
SUT VLOC 180 0 24IN GS25 (SUTURE) ×3 IMPLANT
SYR 50ML LL SCALE MARK (SYRINGE) IMPLANT
TOWEL OR 17X26 10 PK STRL BLUE (TOWEL DISPOSABLE) ×3 IMPLANT
TRAY FOLEY CATH 14FRSI W/METER (CATHETERS) ×3 IMPLANT
WATER STERILE IRR 1500ML POUR (IV SOLUTION) ×3 IMPLANT

## 2014-07-30 NOTE — Progress Notes (Signed)
PHARMACIST - PHYSICIAN COMMUNICATION DR:  Linna CapriceSwinteck CONCERNING:  METFORMIN SAFE ADMINISTRATION POLICY  RECOMMENDATION: Metformin has been placed on DISCONTINUE (rejected order) STATUS and should be reordered only after any of the conditions below are ruled out.  Current safety recommendations include avoiding metformin for a minimum of 48 hours after the patient's exposure to intravenous contrast media.  DESCRIPTION:  The Pharmacy Committee has adopted a policy that restricts the use of metformin in hospitalized patients until all the contraindications to administration have been ruled out. Specific contraindications are: []  Serum creatinine ? 1.5 for males [x]  Serum creatinine ? 1.4 for females []  Shock, acute MI, sepsis, hypoxemia, dehydration []  Planned administration of intravenous iodinated contrast media []  Heart Failure patients with low EF []  Acute or chronic metabolic acidosis (including DKA)      Thank you, Loralee PacasErin Ezabella Teska, PharmD, BCPS 07/30/2014 9:11 PM

## 2014-07-30 NOTE — H&P (View-Only) (Signed)
TOTAL HIP ADMISSION H&P  Patient is admitted for left total hip arthroplasty.  Subjective:  Chief Complaint: left hip pain  HPI: Cassidy MartinetSusan Green, 59 y.o. female, has a history of pain and functional disability in the left hip(s) due to arthritis and patient has failed non-surgical conservative treatments for greater than 12 weeks to include NSAID's and/or analgesics, flexibility and strengthening excercises, use of assistive devices and activity modification.  Onset of symptoms was gradual starting 5 years ago with gradually worsening course since that time.The patient noted no past surgery on the left hip(s).  Patient currently rates pain in the left hip at 10 out of 10 with activity. Patient has night pain, worsening of pain with activity and weight bearing, pain that interfers with activities of daily living and pain with passive range of motion. 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 This condition presents safety issues increasing the risk of falls. There is no current active infection.  There are no active problems to display for this patient.  Past Medical History  Diagnosis Date  . Hypertension   . Hyperlipidemia   . Arthritis   . GERD (gastroesophageal reflux disease)   . Anemia   . Diabetes mellitus without complication   . Anxiety     Past Surgical History  Procedure Laterality Date  . Abdominal hysterectomy  1992  . Tubal ligation       (Not in a hospital admission) No Known Allergies  History  Substance Use Topics  . Smoking status: Former Smoker    Quit date: 07/21/2001  . Smokeless tobacco: Not on file  . Alcohol Use: Yes     Comment: OCCASIONAL    No family history on file.   Review of Systems  Constitutional: Negative.   HENT: Negative.   Eyes: Negative.   Respiratory: Negative.  Negative for shortness of breath.   Cardiovascular:  Negative.  Negative for chest pain.  Gastrointestinal: Negative.   Genitourinary: Negative.   Musculoskeletal: Positive for joint pain. Negative for myalgias, back pain, falls and neck pain.  Skin: Negative.   Neurological: Negative.   Endo/Heme/Allergies: Negative.   Psychiatric/Behavioral: Negative.     Objective:  Physical Exam  Vitals reviewed. Constitutional: She is oriented to person, place, and time. She appears well-developed and well-nourished.  HENT:  Head: Normocephalic and atraumatic.  Eyes: EOM are normal. Pupils are equal, round, and reactive to light.  Neck: Normal range of motion. Neck supple.  Cardiovascular: Normal rate, regular rhythm, normal heart sounds and intact distal pulses.   Respiratory: Effort normal and breath sounds normal.  GI: Soft. Bowel sounds are normal.  Genitourinary:  deferred  Musculoskeletal:       Left hip: She exhibits normal range of motion.  Neurological: She is alert and oriented to person, place, and time. She has normal reflexes.  Skin: Skin is warm and dry.  Psychiatric: She has a normal mood and affect. Her behavior is normal. Judgment and thought content normal.    Vital signs in last 24 hours: @VSRANGES @  Labs:   Estimated body mass index is 23.16 kg/(m^2) as calculated from the following:   Height as of 07/21/14: 5\' 4"  (1.626 m).   Weight as of 07/21/14: 61.236 kg (135 lb).   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 The bone quality appears to be adequate for age and reported activity level.  Assessment/Plan:  End stage arthritis, left hip(s)  The patient history, physical examination, clinical judgement of the provider and imaging studies are consistent with end stage degenerative joint disease of the left hip(s) and total hip arthroplasty is deemed medically necessary. The treatment options including medical management, injection therapy,  arthroscopy and arthroplasty were discussed at length. The risks and benefits of total hip arthroplasty were presented and reviewed. The risks due to aseptic loosening, infection, stiffness, dislocation/subluxation,  thromboembolic complications and other imponderables were discussed.  The patient acknowledged the explanation, agreed to proceed with the plan and consent was signed. Patient is being admitted for inpatient treatment for surgery, pain control, PT, OT, prophylactic antibiotics, VTE prophylaxis, progressive ambulation and ADL's and discharge planning.The patient is planning to be discharged home with home health services

## 2014-07-30 NOTE — Anesthesia Procedure Notes (Signed)
Procedure Name: Intubation Date/Time: 07/30/2014 4:14 PM Performed by: Alexzia Kasler, Nuala AlphaKRISTOPHER Pre-anesthesia Checklist: Patient identified, Emergency Drugs available, Suction available and Patient being monitored Patient Re-evaluated:Patient Re-evaluated prior to inductionOxygen Delivery Method: Circle system utilized Preoxygenation: Pre-oxygenation with 100% oxygen Intubation Type: IV induction Ventilation: Mask ventilation without difficulty Laryngoscope Size: Mac and 3 Grade View: Grade I Tube type: Oral Number of attempts: 1 Airway Equipment and Method: Stylet Placement Confirmation: ETT inserted through vocal cords under direct vision,  positive ETCO2 and breath sounds checked- equal and bilateral Secured at: 22 cm Tube secured with: Tape Dental Injury: Teeth and Oropharynx as per pre-operative assessment

## 2014-07-30 NOTE — Progress Notes (Signed)
Discussed lt foot pain with dr g Katrinka Blazingsmith. Pain believed to be due to foot being placed in surg boot during surg. Good cms noted

## 2014-07-30 NOTE — Interval H&P Note (Signed)
History and Physical Interval Note:  07/30/2014 4:04 PM  Cassidy Green  has presented today for surgery, with the diagnosis of LEFT HIP OA  The various methods of treatment have been discussed with the patient and family. After consideration of risks, benefits and other options for treatment, the patient has consented to  Procedure(s): LEFT TOTAL HIP ARTHROPLASTY ANTERIOR APPROACH (Left) as a surgical intervention .  The patient's history has been reviewed, patient examined, no change in status, stable for surgery.  I have reviewed the patient's chart and labs.  Questions were answered to the patient's satisfaction.     Hubert Derstine, [name]

## 2014-07-30 NOTE — Anesthesia Postprocedure Evaluation (Signed)
  Anesthesia Post-op Note  Patient: Cassidy MartinetSusan Green  Procedure(s) Performed: Procedure(s): LEFT TOTAL HIP ARTHROPLASTY ANTERIOR APPROACH (Left)  Patient Location: PACU  Anesthesia Type:General  Level of Consciousness: awake, oriented, sedated and patient cooperative  Airway and Oxygen Therapy: Patient Spontanous Breathing  Post-op Pain: mild, moderate  Post-op Assessment: Post-op Vital signs reviewed, Patient's Cardiovascular Status Stable, Respiratory Function Stable, Patent Airway, No signs of Nausea or vomiting and Pain level controlled  Post-op Vital Signs: stable  Last Vitals:  Filed Vitals:   07/30/14 2015  BP: 161/72  Pulse: 76  Temp:   Resp: 9    Complications: No apparent anesthesia complications

## 2014-07-30 NOTE — Transfer of Care (Signed)
Immediate Anesthesia Transfer of Care Note  Patient: Cassidy Green  Procedure(s) Performed: Procedure(s): LEFT TOTAL HIP ARTHROPLASTY ANTERIOR APPROACH (Left)  Patient Location: PACU  Anesthesia Type:General  Level of Consciousness: awake, alert  and oriented  Airway & Oxygen Therapy: Patient Spontanous Breathing and Patient connected to face mask oxygen  Post-op Assessment: Report given to RN  Post vital signs: Reviewed and stable  Last Vitals:  Filed Vitals:   07/30/14 1954  BP: 125/67  Pulse: 88  Temp: 36.4 C  Resp: 12    Complications: No apparent anesthesia complications

## 2014-07-30 NOTE — Op Note (Signed)
OPERATIVE REPORT  SURGEON: Samson FredericBrian Dwain Huhn, MD   ASSISTANT: Dimitri PedAmber Constable, PA-C.  PREOPERATIVE DIAGNOSIS: Left hip arthritis with protrusio deformity.   POSTOPERATIVE DIAGNOSIS: Left hip arthritis with prostrusio deformity.   PROCEDURE: Left total hip arthroplasty, anterior approach.   IMPLANTS: DePuy Tri Lock stem, size 0, standard offset. DePuy Pinnacle Cup, size 50 mm. DePuy Altrx liner, size 50 by 32 mm, neutral. DePuy Biolox ceramic head ball, size 32 + 1 mm. 6.5 mm x 30 mm cancellous bone screw x2.  ANESTHESIA:  General  ESTIMATED BLOOD LOSS: 250 cc.   ANTIBIOTICS: 2g ancef.  DRAINS: None.  COMPLICATIONS: None.   CONDITION: PACU - hemodynamically stable.Marland Kitchen.   BRIEF CLINICAL NOTE: Cassidy Green is a 59 y.o. female with a long-standing history of Left hip arthritis. After failing conservative management, the patient was indicated for total hip arthroplasty. The risks, benefits, and alternatives to the procedure were explained, and the patient elected to proceed.  PROCEDURE IN DETAIL: Surgical site was marked by myself. Spinal anesthesia was obtained in the pre-op holding area. Once inside the operative room, a foley catheter was inserted. The patient was then positioned on the Hana table. All bony prominences were well padded. The hip was prepped and draped in the normal sterile surgical fashion. A time-out was called verifying side and site of surgery. The patient received IV antibiotics within 60 minutes of beginning the procedure.  The direct anterior approach to the hip was performed through the Hueter interval. Lateral femoral circumflex vessels were treated with the Auqumantys. The anterior capsule was exposed and an inverted T capsulotomy was made.The femoral neck cut was made to the level of the templated cut. A corkscrew was placed into the head and the head was removed. The femoral head was found to have eburnated bone. The head was passed to the back  table and was measured.  Acetabular exposure was achieved, and the pulvinar and labrum were excised. She was noted to have a protrusio deformity with a narrow introitus. I carefully debrided the soft tissue from the medial wall of the acetabulum using a 43 mm reamer. Sequental reaming of the acetabulum was then performed up to a size 49 mm reamer, taking care to keep the hip center low and lateral. There was a large cyst in the superior acetabulum, which I curetted to bleeding bone. Bone graft was harvested from her native head using a reamer. I placed the bone graft into the cyst and protrusio defect. I impacted the bone graft with a 48 mm reamer on reverse. A 50 mm cup was then opened and impacted into place at approximately 40 degrees of abduction and 20 degrees of anteversion. The cup achieved excellent press fit, and I chose to augment the fixation with 6.5 mm cancellous screws x2. The final polyethylene liner was impacted into place and acetabular osteophytes were removed.   I then gained femoral exposure taking care to protect the abductors and greater trochanter. This was performed using standard external rotation, extension, and adduction. The capsule was peeled off the inner aspect of the greater trochanter, taking care to preserve the short external rotators. A cookie cutter was used to enter the femoral canal, and then the femoral canal finder was placed. Broaching was performed with a size 0. Calcar planer was used on the femoral neck remnant. I paced a std offset neck and a trial head ball. The hip was reduced. Leg lengths and offset were checked fluoroscopically. The hip was dislocated and trial components were  removed. The final implants were placed, and the hip was reduced.  Fluoroscopy was used to confirm component position and leg lengths. At 90 degrees of external rotation and full extension, the hip was stable to an anterior directed force.  The wound was copiously irrigated  with a dilute betadine solution followed by normal saline. Marcaine solution was injected into the periarticular soft tissue. The wound was closed in layers using #1 Vicryl and V-Loc for the fascia, 2-0 Vicryl for the subcutaneous fat, 2-0 Monocryl for the deep dermal layer, 3-0 running Monocryl subcuticular stitch, and Dermabond for the skin. Once the glue was fully dried, an Aquacell Ag dressing was applied. The patient was transported to the recovery room in stable condition. Sponge, needle, and instrument counts were correct at the end of the case x2. The patient tolerated the procedure well and there were no known complications.  Please note that a surgical assistant was a medical necessity for this procedure to perform it in a safe and expeditious manner. Assistant was necessary to provide appropriate retraction of vital neurovascular structures, to prevent femoral fracture, and to allow for anatomic placement of the prosthesis.

## 2014-07-30 NOTE — Anesthesia Preprocedure Evaluation (Addendum)
Anesthesia Evaluation  Patient identified by MRN, date of birth, ID band Patient awake    Reviewed: Allergy & Precautions, NPO status , Patient's Chart, lab work & pertinent test results  Airway        Dental   Pulmonary former smoker,          Cardiovascular hypertension,     Neuro/Psych    GI/Hepatic GERD-  ,  Endo/Other  diabetes, Type 2, Oral Hypoglycemic Agents  Renal/GU      Musculoskeletal  (+) Arthritis -,   Abdominal   Peds  Hematology  (+) anemia ,   Anesthesia Other Findings   Reproductive/Obstetrics                            Anesthesia Physical Anesthesia Plan  ASA: III  Anesthesia Plan: General   Post-op Pain Management:    Induction: Intravenous  Airway Management Planned: Oral ETT  Additional Equipment:   Intra-op Plan:   Post-operative Plan: Extubation in OR  Informed Consent: I have reviewed the patients History and Physical, chart, labs and discussed the procedure including the risks, benefits and alternatives for the proposed anesthesia with the patient or authorized representative who has indicated his/her understanding and acceptance.     Plan Discussed with: CRNA, Anesthesiologist and Surgeon  Anesthesia Plan Comments:         Anesthesia Quick Evaluation

## 2014-07-31 LAB — BASIC METABOLIC PANEL
Anion gap: 8 (ref 5–15)
BUN: 20 mg/dL (ref 6–23)
CO2: 22 mmol/L (ref 19–32)
Calcium: 8.6 mg/dL (ref 8.4–10.5)
Chloride: 108 mmol/L (ref 96–112)
Creatinine, Ser: 1.16 mg/dL — ABNORMAL HIGH (ref 0.50–1.10)
GFR calc Af Amer: 59 mL/min — ABNORMAL LOW (ref >90)
GFR, EST NON AFRICAN AMERICAN: 50 mL/min — AB (ref >90)
GLUCOSE: 147 mg/dL — AB (ref 70–99)
Potassium: 4.3 mmol/L (ref 3.5–5.1)
Sodium: 138 mmol/L (ref 135–145)

## 2014-07-31 LAB — CBC
HCT: 25.2 % — ABNORMAL LOW (ref 36.0–46.0)
HEMOGLOBIN: 8.2 g/dL — AB (ref 12.0–15.0)
MCH: 27.9 pg (ref 26.0–34.0)
MCHC: 32.5 g/dL (ref 30.0–36.0)
MCV: 85.7 fL (ref 78.0–100.0)
Platelets: 188 10*3/uL (ref 150–400)
RBC: 2.94 MIL/uL — AB (ref 3.87–5.11)
RDW: 15.8 % — ABNORMAL HIGH (ref 11.5–15.5)
WBC: 10.6 10*3/uL — ABNORMAL HIGH (ref 4.0–10.5)

## 2014-07-31 LAB — GLUCOSE, CAPILLARY
Glucose-Capillary: 151 mg/dL — ABNORMAL HIGH (ref 70–99)
Glucose-Capillary: 138 mg/dL — ABNORMAL HIGH (ref 70–99)
Glucose-Capillary: 149 mg/dL — ABNORMAL HIGH (ref 70–99)
Glucose-Capillary: 179 mg/dL — ABNORMAL HIGH (ref 70–99)

## 2014-07-31 MED ORDER — METFORMIN HCL ER 500 MG PO TB24
1000.0000 mg | ORAL_TABLET | Freq: Every day | ORAL | Status: DC
  Filled 2014-07-31: qty 2

## 2014-07-31 MED ORDER — METFORMIN HCL 500 MG PO TABS
1000.0000 mg | ORAL_TABLET | Freq: Every day | ORAL | Status: DC
  Administered 2014-07-31: 1000 mg via ORAL
  Filled 2014-07-31 (×2): qty 2

## 2014-07-31 MED ORDER — DOCUSATE SODIUM 100 MG PO CAPS: 100.0000 mg | ORAL_CAPSULE | Freq: Two times a day (BID) | ORAL | Status: DC

## 2014-07-31 MED ORDER — MELOXICAM 15 MG PO TABS: 15.0000 mg | ORAL_TABLET | Freq: Every day | ORAL | Status: DC

## 2014-07-31 MED ORDER — HYDROCODONE-ACETAMINOPHEN 7.5-325 MG PO TABS: 1.0000 | ORAL_TABLET | ORAL | Status: DC | PRN

## 2014-07-31 MED ORDER — ONDANSETRON HCL 4 MG PO TABS: 4.0000 mg | ORAL_TABLET | Freq: Four times a day (QID) | ORAL | Status: DC | PRN

## 2014-07-31 MED ORDER — METHOCARBAMOL 500 MG PO TABS: 500.0000 mg | ORAL_TABLET | Freq: Four times a day (QID) | ORAL | Status: DC | PRN

## 2014-07-31 MED ORDER — CETYLPYRIDINIUM CHLORIDE 0.05 % MT LIQD
7.0000 mL | Freq: Two times a day (BID) | OROMUCOSAL | Status: DC
  Administered 2014-07-31: 7 mL via OROMUCOSAL

## 2014-07-31 MED ORDER — ASPIRIN EC 325 MG PO TBEC: 325.0000 mg | DELAYED_RELEASE_TABLET | Freq: Two times a day (BID) | ORAL | Status: DC

## 2014-07-31 NOTE — Discharge Instructions (Signed)
Dr. Rod Can Joint Replacement Specialist [facility] [address] [place], [place] [address] [phone]   TOTAL HIP REPLACEMENT POSTOPERATIVE DIRECTIONS    Hip Rehabilitation, Guidelines Following Surgery  The results of a hip operation are greatly improved after range of motion and muscle strengthening exercises. Follow all safety measures which are given to protect your hip. If any of these exercises cause increased pain or swelling in your joint, decrease the amount until you are comfortable again. Then slowly increase the exercises. Call your caregiver if you have problems or questions.  HOME CARE INSTRUCTIONS  Most of the following instructions are designed to prevent the dislocation of your new hip.  Remove items at home which could result in a fall. This includes throw rugs or furniture in walking pathways.  Continue medications as instructed at time of discharge.  You may have some home medications which will be placed on hold until you complete the course of blood thinner medication.  You may start showering once you are discharged home. Do not remove your dressing. Do not put on socks or shoes without following the instructions of your caregivers.   Sit on chairs with arms. Use the chair arms to help push yourself up when arising.  Arrange for the use of a toilet seat elevator so you are not sitting low.   Walk with walker as instructed.  You may resume a sexual relationship in one month or when given the OK by your caregiver.  Use walker as long as suggested by your caregivers.  You may put full weight on your legs and walk as much as is comfortable. Avoid periods of inactivity such as sitting longer than an hour when not asleep. This helps prevent blood clots.  You may return to work once you are cleared by Engineer, production.  Do not drive a car for 6 weeks or until released by your surgeon.  Do not drive while taking narcotics.  Wear  elastic stockings for two weeks following surgery during the day but you may remove then at night.  Make sure you keep all of your appointments after your operation with all of your doctors and caregivers. You should call the office at the above phone number and make an appointment for approximately two weeks after the date of your surgery. Please pick up a stool softener and laxative for home use as long as you are requiring pain medications.  ICE to the affected hip every three hours for 30 minutes at a time and then as needed for pain and swelling. Continue to use ice on the hip for pain and swelling from surgery. You may notice swelling that will progress down to the foot and ankle.  This is normal after surgery.  Elevate the leg when you are not up walking on it.   It is important for you to complete the blood thinner medication as prescribed by your doctor.  Continue to use the breathing machine which will help keep your temperature down.  It is common for your temperature to cycle up and down following surgery, especially at night when you are not up moving around and exerting yourself.  The breathing machine keeps your lungs expanded and your temperature down.  RANGE OF MOTION AND STRENGTHENING EXERCISES  These exercises are designed to help you keep full movement of your hip joint. Follow your caregiver's or physical therapist's instructions. Perform all exercises about fifteen times, three times per day or as directed. Exercise both  hips, even if you have had only one joint replacement. These exercises can be done on a training (exercise) mat, on the floor, on a table or on a bed. Use whatever works the best and is most comfortable for you. Use music or television while you are exercising so that the exercises are a pleasant break in your day. This will make your life better with the exercises acting as a break in routine you can look forward to.  °Lying on your back, slowly slide your foot  toward your buttocks, raising your knee up off the floor. Then slowly slide your foot back down until your leg is straight again.  °Lying on your back spread your legs as far apart as you can without causing discomfort.  °Lying on your side, raise your upper leg and foot straight up from the floor as far as is comfortable. Slowly lower the leg and repeat.  °Lying on your back, tighten up the muscle in the front of your thigh (quadriceps muscles). You can do this by keeping your leg straight and trying to raise your heel off the floor. This helps strengthen the largest muscle supporting your knee.  °Lying on your back, tighten up the muscles of your buttocks both with the legs straight and with the knee bent at a comfortable angle while keeping your heel on the floor.  ° °SKILLED REHAB INSTRUCTIONS: °If the patient is transferred to a skilled rehab facility following release from the hospital, a list of the current medications will be sent to the facility for the patient to continue.  When discharged from the skilled rehab facility, please have the facility set up the patient's Home Health Physical Therapy prior to being released. Also, the skilled facility will be responsible for providing the patient with their medications at time of release from the facility to include their pain medication and their blood thinner medication. If the patient is still at the rehab facility at time of the two week follow up appointment, the skilled rehab facility will also need to assist the patient in arranging follow up appointment in our office and any transportation needs. ° °MAKE SURE YOU:  °Understand these instructions.  °Will watch your condition.  °Will get help right away if you are not doing well or get worse. ° °Pick up stool softner and laxative for home use following surgery while on pain medications. °Do not remove your dressing. °The dressing is waterproof--it is OK to take showers. °Continue to use ice for pain and  swelling after surgery. °Do not use any lotions or creams on the incision until instructed by your surgeon. °Total Hip Protocol. °

## 2014-07-31 NOTE — Evaluation (Signed)
Occupational Therapy Evaluation Patient Details Name: Cassidy Green MRN: [id number] DOB: 17-Nov-1955 Today's Date: 07/31/2014    History of Present Illness pt is s/p L DA THA   Clinical Impression   This 59 year old female was admitted for the above surgery.  She will benefit from skilled OT to continue education for safe bathroom transfers and to further educate on AE.  Will follow in acute with supervision to min guard level goals.  Pt currently needs min A for mobility and up to max A for ADLs.  She will have assist with adls at home    Follow Up Recommendations  Supervision/Assistance - 24 hour    Equipment Recommendations  3 in 1 bedside comode (likely)    Recommendations for Other Services       Precautions / Restrictions Precautions Precautions: Fall Restrictions Weight Bearing Restrictions: No Other Position/Activity Restrictions: WBAT      Mobility Bed Mobility Overal bed mobility: Needs Assistance Bed Mobility: Supine to Sit     Supine to sit: Min assist     General bed mobility comments: assist for leg then lightly for trunk when sitting up  Transfers Overall transfer level: Needs assistance Equipment used: Rolling walker (2 wheeled) Transfers: Sit to/from Stand Sit to Stand: Min assist;From elevated surface         General transfer comment: assist to power up and stabilize.  Cues for UE/LE placement    Balance                                            ADL Overall ADL's : Needs assistance/impaired     Grooming: Set up;Sitting   Upper Body Bathing: Set up;Sitting   Lower Body Bathing: Moderate assistance;Sit to/from stand   Upper Body Dressing : Set up;Sitting   Lower Body Dressing: Maximal assistance;Sit to/from stand   Toilet Transfer: Minimal assistance;Stand-pivot (bed to recliner)             General ADL Comments: pt is moving tentatively, but only requires min A.  Demonstrated AE, but pt did not try.  She is  interested in having a reacher:  she will have 24/7 for 4 weeks.  Pt may like to try sock aide on next visit.  Educated about 3:1 to place over toilet and possibly in shower, if it will fit.  Reinforced safety and working within pain free tolerance     Vision     Perception     Praxis      Pertinent Vitals/Pain Pain Assessment: 0-10 Pain Score: 6  Pain Location: L hip, with weight bearing Pain Descriptors / Indicators: Aching Pain Intervention(s): Limited activity within patient's tolerance;Monitored during session;Premedicated before session;Repositioned;Ice applied     Hand Dominance     Extremity/Trunk Assessment Upper Extremity Assessment Upper Extremity Assessment: Overall WFL for tasks assessed           Communication Communication Communication: No difficulties   Cognition Arousal/Alertness: Awake/alert Behavior During Therapy: WFL for tasks assessed/performed Overall Cognitive Status: Within Functional Limits for tasks assessed                     General Comments       Exercises       Shoulder Instructions      Home Living Family/patient expects to be discharged to:: Private residence Living Arrangements: Spouse/significant other Available Help  at Discharge: Family;Friend(s)               Bathroom Shower/Tub: Walk-in shower (Naval architectcorner shower)   Bathroom Toilet: Handicapped height                Prior Functioning/Environment Level of Independence: Independent             OT Diagnosis: Generalized weakness;Acute pain   OT Problem List: Decreased strength;Decreased activity tolerance;Decreased knowledge of use of DME or AE;Pain   OT Treatment/Interventions: Self-care/ADL training;DME and/or AE instruction;Patient/family education    OT Goals(Current goals can be found in the care plan section) Acute Rehab OT Goals Patient Stated Goal: get back to being independent OT Goal Formulation: With patient Time For Goal Achievement:  08/07/14 Potential to Achieve Goals: Good ADL Goals Pt Will Perform Grooming: with supervision;standing Pt Will Transfer to Toilet: with min guard assist;ambulating;bedside commode Pt Will Perform Toileting - Clothing Manipulation and hygiene: with min guard assist;sit to/from stand Pt Will Perform Tub/Shower Transfer: with min guard assist;3 in 1;ambulating Additional ADL Goal #1: pt will use AE with supervision vs verbalizing use of reacher/sock aide  OT Frequency: Min 2X/week   Barriers to D/C:            Co-evaluation              End of Session    Activity Tolerance: Patient tolerated treatment well Patient left: in chair;with call bell/phone within reach   Time: 0807-0832 OT Time Calculation (min): 25 min Charges:  OT General Charges $OT Visit: 1 Procedure OT Evaluation $Initial OT Evaluation Tier I: 1 Procedure OT Treatments $Self Care/Home Management : 8-22 mins G-Codes:    Cassidy Green 07/31/2014, 8:44 AM  Cassidy Green, OTR/L [phone] 07/31/2014

## 2014-07-31 NOTE — Discharge Summary (Signed)
Physician Discharge Summary  Patient ID: Cassidy Green Tindall MRN: [id number] DOB/AGE: [birth year] 59 y.o.  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 Hypertension   . Hyperlipidemia   . Arthritis   . GERD (gastroesophageal reflux disease)   . Anemia   . Diabetes mellitus without complication   . Anxiety     Surgeries: Procedure(s): LEFT TOTAL HIP ARTHROPLASTY ANTERIOR APPROACH on 07/30/2014   Consultants (if any):    Discharged Condition: Improved  Hospital Course: Cassidy Green Theil is an 59 y.o. female who was admitted 07/30/2014 with a diagnosis of Degenerative joint disease of left hip and went to the operating room on 07/30/2014 and underwent the above named procedures.    She was given perioperative antibiotics:      Anti-infectives    Start     Dose/Rate Route Frequency Ordered Stop   07/31/14 1000  amoxicillin (AMOXIL) capsule 500 mg     500 mg Oral 4 times daily 07/30/14 2104     07/31/14 0600  ceFAZolin (ANCEF) IVPB 2 g/50 mL premix     2 g 100 mL/hr over 30 Minutes Intravenous On call to O.R. 07/30/14 1322 07/30/14 1622   07/30/14 2200  ceFAZolin (ANCEF) IVPB 1 g/50 mL premix     1 g 100 mL/hr over 30 Minutes Intravenous Every 6 hours 07/30/14 2104 07/31/14 0518    .  She was given sequential compression devices, early ambulation, and lovenox for DVT prophylaxis.  She benefited maximally from the hospital stay and there were no complications.    Recent vital signs:  Filed Vitals:   08/01/14 0649  BP: 123/76  Pulse: 110  Temp: 99.7 F (37.6 C)  Resp: 16    Recent laboratory studies:  Lab Results  Component Value Date   HGB 7.1* 08/01/2014   HGB 8.2* 07/31/2014   HGB 10.9* 07/21/2014   Lab  Results  Component Value Date   WBC 8.2 08/01/2014   PLT 160 08/01/2014   Lab Results  Component Value Date   INR 1.05 07/21/2014   Lab Results  Component Value Date   NA 138 07/31/2014   K 4.3 07/31/2014   CL 108 07/31/2014   CO2 22 07/31/2014   BUN 20 07/31/2014   CREATININE 1.16* 07/31/2014   GLUCOSE 147* 07/31/2014    Discharge Medications:     Medication List    STOP taking these medications        ibuprofen 600 MG tablet  Commonly known as:  ADVIL,MOTRIN      TAKE these medications        ALPRAZolam 0.25 MG tablet  Commonly known as:  XANAX  Take 0.25 mg by mouth daily as needed for anxiety.     amoxicillin 500 MG capsule  Commonly known as:  AMOXIL  Take 500 mg by mouth 4 (four) times daily.     aspirin EC 325 MG tablet  Take 1 tablet (325 mg total) by mouth 2 (two) times daily after a meal.     BC HEADACHE POWDER PO  Take 1 each by mouth daily as needed (headaches.).     docusate sodium 100 MG capsule  Commonly known as:  COLACE  Take 1 capsule (100 mg total) by mouth 2 (  two) times daily.     ezetimibe 10 MG tablet  Commonly known as:  ZETIA  Take 10 mg by mouth at bedtime.     folic acid 1 MG tablet  Commonly known as:  FOLVITE  Take 1 mg by mouth at bedtime.     HEMOCYTE PO  Take 1 tablet by mouth 2 (two) times daily.     HYDROcodone-acetaminophen 7.5-325 MG per tablet  Commonly known as:  NORCO  Take 1-2 tablets by mouth every 4 (four) hours as needed for moderate pain (breakthrough pain).     lisinopril 20 MG tablet  Commonly known as:  PRINIVIL,ZESTRIL  Take 20 mg by mouth at bedtime.     meloxicam 15 MG tablet  Commonly known as:  MOBIC  Take 1 tablet (15 mg total) by mouth daily.     methocarbamol 500 MG tablet  Commonly known as:  ROBAXIN  Take 1 tablet (500 mg total) by mouth every 6 (six) hours as needed for muscle spasms.     omeprazole 40 MG capsule  Commonly known as:  PRILOSEC  Take 40 mg by mouth at bedtime.      ondansetron 4 MG tablet  Commonly known as:  ZOFRAN  Take 1 tablet (4 mg total) by mouth every 6 (six) hours as needed for nausea or vomiting.     sitaGLIPtin-metformin 50-1000 MG per tablet  Commonly known as:  JANUMET  Take 1 tablet by mouth at bedtime.     sodium chloride 0.65 % Soln nasal spray  Commonly known as:  OCEAN  Place 1 spray into both nostrils at bedtime.     Vitamin D 2000 UNITS Caps  Take 1 capsule by mouth at bedtime.        Diagnostic Studies: Dg C-arm Gt 120 Min-no Report  07/30/2014   CLINICAL DATA: surgery   C-ARM GT 120 MINUTE  Fluoroscopy was utilized by the requesting physician.  No radiographic  interpretation.    Dg Hip Unilat With Pelvis 1v Left  07/30/2014   CLINICAL DATA:  Status post left hip replacement  EXAM: LEFT HIP (WITH PELVIS) 1 VIEW; DG C-ARM GT 120 MIN - NRPT [facility]  COMPARISON:  None.  FINDINGS: A left hip replacement is noted. No acute bony abnormality is seen. No gross soft tissue abnormality is noted.  IMPRESSION: Status post left hip replacement   Electronically Signed   By: Alcide Clever M.D.   On: 07/30/2014 19:56   Dg Hip Port Unilat With Pelvis 1v Left  07/30/2014   CLINICAL DATA:  Status post left hip surgery.  EXAM: LEFT HIP (WITH PELVIS) 1 VIEW PORTABLE  COMPARISON:  Same day.  FINDINGS: Status post left total hip arthroplasty. The femoral and acetabular components appear to be well situated. No fracture or dislocation is noted. Mild degenerative change of right hip joint is noted.  IMPRESSION: Status post left total hip arthroplasty.   Electronically Signed   By: Lupita Raider, M.D.   On: 07/30/2014 20:45    Disposition: 01-Home or Self Care  Discharge Instructions    Call MD / Call 911    Complete by:  As directed   If you experience chest pain or shortness of breath, CALL 911 and be transported to the hospital emergency room.  If you develope a fever above 101 F, pus (white drainage) or increased drainage or redness at the wound, or  calf pain, call your surgeon's office.     Call MD /  Call 911    Complete by:  As directed   If you experience chest pain or shortness of breath, CALL 911 and be transported to the hospital emergency room.  If you develope a fever above 101 F, pus (white drainage) or increased drainage or redness at the wound, or calf pain, call your surgeon's office.     Constipation Prevention    Complete by:  As directed   Drink plenty of fluids.  Prune juice may be helpful.  You may use a stool softener, such as Colace (over the counter) 100 mg twice a day.  Use MiraLax (over the counter) for constipation as needed.     Constipation Prevention    Complete by:  As directed   Drink plenty of fluids.  Prune juice may be helpful.  You may use a stool softener, such as Colace (over the counter) 100 mg twice a day.  Use MiraLax (over the counter) for constipation as needed.     Diet - low sodium heart healthy    Complete by:  As directed      Diet - low sodium heart healthy    Complete by:  As directed      Driving restrictions    Complete by:  As directed   No driving until cleared by MD     Follow the hip precautions as taught in Physical Therapy    Complete by:  As directed      Increase activity slowly as tolerated    Complete by:  As directed      Increase activity slowly as tolerated    Complete by:  As directed      Lifting restrictions    Complete by:  As directed   No lifting for 6 weeks     Weight bearing as tolerated    Complete by:  As directed   Laterality:  left  Extremity:  Lower           Follow-up Information    Follow up with [facility].   Why:  rolling walker and 3n1 (commode)   Contact information:   [address] [place] [address] [phone]       Follow up with [facility].   Why:  home health physical therapy   Contact information:   [address] [place] [address] [phone]       Follow up with  Temika Sutphin, Cloyde Reams, MD. Schedule an appointment as soon as possible for a visit in 2 weeks.   Specialty:  Orthopedic Surgery   Why:  For wound re-check   Contact information:   [address] [place] [address] [phone]        Signed: Garnet Koyanagi 08/01/2014, 1:46 PM

## 2014-07-31 NOTE — Evaluation (Signed)
Physical Therapy Evaluation Patient Details Name: Cassidy Green MRN: [id number] DOB: [birth year] Today's Date: 07/31/2014   History of Present Illness  pt is s/p L DA THA  Clinical Impression  Pt s/p L THR presents with decreased L LE strength/ROM and post op pain limiting functional mobility.  Pt should progress to d/c home with family assist and HHPT follow up.    Follow Up Recommendations Home health PT    Equipment Recommendations  Rolling walker with 5" wheels    Recommendations for Other Services OT consult     Precautions / Restrictions Precautions Precautions: Fall Restrictions Weight Bearing Restrictions: No Other Position/Activity Restrictions: WBAT      Mobility  Bed Mobility Overal bed mobility: Needs Assistance Bed Mobility: Sit to Supine       Sit to supine: Min assist;Mod assist   General bed mobility comments: cues for sequence and assist with Bil LEs  Transfers Overall transfer level: Needs assistance Equipment used: Rolling walker (2 wheeled) Transfers: Sit to/from Stand Sit to Stand: Mod assist         General transfer comment: assist to power up and stabilize.  Cues for UE/LE placement  Ambulation/Gait Ambulation/Gait assistance: Min assist;Mod assist Ambulation Distance (Feet): 70 Feet Assistive device: Rolling walker (2 wheeled) Gait Pattern/deviations: Step-to pattern;Step-through pattern;Decreased step length - right;Decreased step length - left;Shuffle;Trunk flexed Gait velocity: decr   General Gait Details: cues for sequence, posture, position from [employer]            Wheelchair Mobility    Modified Rankin (Stroke Patients Only)       Balance                                             Pertinent Vitals/Pain Pain Assessment: 0-10 Pain Score: 4  Pain Location: L hip and thigh Pain Descriptors / Indicators: Aching;Tender Pain Intervention(s): Limited activity within patient's tolerance;Monitored  during session;Premedicated before session    Home Living Family/patient expects to be discharged to:: Private residence Living Arrangements: Spouse/significant other Available Help at Discharge: Family;Friend(s) Type of Home: House Home Access: Stairs to enter Entrance Stairs-Rails: Doctor, general practice of Steps: 3 Home Layout: One level Home Equipment: Cane - single point      Prior Function Level of Independence: Independent;Independent with assistive device(s)               Hand Dominance        Extremity/Trunk Assessment   Upper Extremity Assessment: Overall WFL for tasks assessed           Lower Extremity Assessment: LLE deficits/detail   LLE Deficits / Details: 2/5 hip strength with AAROM at hip to 75 flex and 15 abd  Cervical / Trunk Assessment: Normal  Communication   Communication: No difficulties  Cognition Arousal/Alertness: Awake/alert Behavior During Therapy: WFL for tasks assessed/performed Overall Cognitive Status: Within Functional Limits for tasks assessed                      General Comments      Exercises Total Joint Exercises Ankle Circles/Pumps: AROM;Both;15 reps;Supine Quad Sets: AROM;Both;10 reps;Supine Heel Slides: AAROM Hip ABduction/ADduction: AAROM;Left;10 reps      Assessment/Plan    PT Assessment Patient needs continued PT services  PT Diagnosis Difficulty walking   PT Problem List Decreased strength;Decreased range of motion;Decreased activity tolerance;Decreased mobility;Decreased knowledge  of use of DME;Pain  PT Treatment Interventions DME instruction;Gait training;Stair training;Functional mobility training;Therapeutic activities;Therapeutic exercise;Patient/family education   PT Goals (Current goals can be found in the Care Plan section) Acute Rehab PT Goals Patient Stated Goal: get back to being independent PT Goal Formulation: With patient Time For Goal Achievement: 08/07/14 Potential to  Achieve Goals: Good    Frequency 7X/week   Barriers to discharge        Co-evaluation               End of Session Equipment Utilized During Treatment: Gait belt Activity Tolerance: Patient tolerated treatment well;Patient limited by pain;Patient limited by lethargy Patient left: in bed;with call bell/phone within reach Nurse Communication: Mobility status         Time: 0930-1010 PT Time Calculation (min) (ACUTE ONLY): 40 min   Charges:   PT Evaluation $Initial PT Evaluation Tier I: 1 Procedure PT Treatments $Gait Training: 8-22 mins $Therapeutic Exercise: 8-22 mins   PT G Codes:        Cassidy Green 07/31/2014, 12:42 PM

## 2014-07-31 NOTE — Progress Notes (Signed)
Physical Therapy Treatment Patient Details Name: Simonne MartinetSusan Okuda MRN: [id number] DOB: [birth year] Today's Date: 07/31/2014    History of Present Illness pt is s/p L DA THA    PT Comments    Progressing well with mobility.  Pt hopeful for dc in am  Follow Up Recommendations  Home health PT     Equipment Recommendations  Rolling walker with 5" wheels    Recommendations for Other Services OT consult     Precautions / Restrictions Precautions Precautions: Fall Restrictions Weight Bearing Restrictions: No Other Position/Activity Restrictions: WBAT    Mobility  Bed Mobility Overal bed mobility: Needs Assistance Bed Mobility: Sit to Supine       Sit to supine: Min assist   General bed mobility comments: assist for LLE and cues for technique  Transfers Overall transfer level: Needs assistance Equipment used: Rolling walker (2 wheeled) Transfers: Sit to/from Stand Sit to Stand: Min guard         General transfer comment: cues for LE management and use of UEs to self assist  Ambulation/Gait Ambulation/Gait assistance: Min assist;Min guard Ambulation Distance (Feet): 357 Feet Assistive device: Rolling walker (2 wheeled) Gait Pattern/deviations: Step-to pattern;Step-through pattern;Shuffle;Trunk flexed Gait velocity: decr   General Gait Details: cues for sequence, posture, position from [employer]   Stairs            Wheelchair Mobility    Modified Rankin (Stroke Patients Only)       Balance                                    Cognition Arousal/Alertness: Awake/alert Behavior During Therapy: WFL for tasks assessed/performed Overall Cognitive Status: Within Functional Limits for tasks assessed                      Exercises      General Comments        Pertinent Vitals/Pain Pain Assessment: 0-10 Pain Score: 3  Pain Location: L hip Pain Descriptors / Indicators: Sore Pain Intervention(s): Limited activity within patient's  tolerance;Monitored during session;Premedicated before session;Ice applied    Home Living                      Prior Function            PT Goals (current goals can now be found in the care plan section) Acute Rehab PT Goals Patient Stated Goal: get back to being independent PT Goal Formulation: With patient Time For Goal Achievement: 08/07/14 Potential to Achieve Goals: Good Progress towards PT goals: Progressing toward goals    Frequency  7X/week    PT Plan Current plan remains appropriate    Co-evaluation             End of Session Equipment Utilized During Treatment: Gait belt Activity Tolerance: Patient tolerated treatment well Patient left: in bed;with call bell/phone within reach     Time: 1433-1458 PT Time Calculation (min) (ACUTE ONLY): 25 min  Charges:  $Gait Training: 23-37 mins                    G Codes:      Jsean Taussig 07/31/2014, 4:37 PM

## 2014-07-31 NOTE — Care Management Note (Signed)
    Page 1 of 2   07/31/2014     7:27:21 AM CARE MANAGEMENT NOTE 07/31/2014  Patient:  Cassidy Green, Cassidy Green   Account Number:  [id number]  Date Initiated:  07/31/2014  Documentation initiated by:  [facility]  Subjective/Objective Assessment:   adm: LEFT TOTAL HIP ARTHROPLASTY ANTERIOR APPROACH (Left)     Action/Plan:   discharge planning   Anticipated DC Date:  07/31/2014   Anticipated DC Plan:  [place]  CM consult      [facility] Choice  HOME HEALTH   Choice offered to / List presented to:  C-1 Patient   DME arranged  3-N-1  Vassie Moselle      DME agency  [place] arranged  [facility]   Status of service:  Completed, signed off Medicare Important Message given?   (If response is "NO", the following Medicare IM given date fields will be blank) Date Medicare IM given:   Medicare IM given by:   Date Additional Medicare IM given:   Additional Medicare IM given by:    Discharge Disposition:  [place]  Per UR Regulation:    If discussed at Long Length of Stay Meetings, dates discussed:    Comments:  07/31/14 07:20 CM met with pt in room to offer choice of home health agency.  Pt chooses [facility] to render HHPT.  Address and contact information verified by pt.  Cm called [facility] DME rep, Lecretia to please deliver 3n1 and rolling walker to room prior to discharge.  Referral emailed to [employer], Tim.  No other CM needs were communicated.  Mariane Masters, BSN, CM [phone].

## 2014-07-31 NOTE — Progress Notes (Signed)
   Subjective:  Patient reports pain as mild.  No c/o. Denies N/V/CP/SOB.  Objective:   VITALS:   Filed Vitals:   07/30/14 2304 07/31/14 0015 07/31/14 0204 07/31/14 0559  BP: 148/78 152/79 146/79 135/58  Pulse: 89 79 86 85  Temp: 97.5 F (36.4 C) 98 F (36.7 C) 98.9 F (37.2 C) 97.4 F (36.3 C)  TempSrc: Oral Oral Oral Oral  Resp: 14 16 16 16   Height:      Weight:      SpO2: 100% 99% 100% 100%    Neurologically intact ABD soft Sensation intact distally Intact pulses distally Dorsiflexion/Plantar flexion intact Incision: dressing C/D/I   Lab Results  Component Value Date   WBC 10.6* 07/31/2014   HGB 8.2* 07/31/2014   HCT 25.2* 07/31/2014   MCV 85.7 07/31/2014   PLT 188 07/31/2014   BMET    Component Value Date/Time   NA 138 07/31/2014 0437   K 4.3 07/31/2014 0437   CL 108 07/31/2014 0437   CO2 22 07/31/2014 0437   GLUCOSE 147* 07/31/2014 0437   BUN 20 07/31/2014 0437   CREATININE 1.16* 07/31/2014 0437   CALCIUM 8.6 07/31/2014 0437   GFRNONAA 50* 07/31/2014 0437   GFRAA 59* 07/31/2014 0437     Assessment/Plan: 1 Day Post-Op   Principal Problem:   Degenerative joint disease of left hip Active Problems:   Primary osteoarthritis of left hip   WBAT SCDs, TEDs Lovenox in house, home on ASA IV ancef x23 hrs Acute blood loss on chronic anemia: asymptomatic, monitor Advance diet Up with therapy Discharge home with home health when clears PT/OT    Cassidy Green, Cassidy Green 07/31/2014, 7:23 AM   Cassidy FredericBrian Deitrick Ferreri, MD Cell [phone]

## 2014-07-31 NOTE — Progress Notes (Signed)
PHARMACIST - PHYSICIAN COMMUNICATION DR: Linna CapriceSwinteck CONCERNING: METFORMIN SAFE ADMINISTRATION POLICY  Update:  Scr = 1.16 3/4 so OK to resume metformin as SCr < 1.4  Plan: 1. Resumed metformin 1000mg  qhs as per home dosing. Continue sitagliptin to make Janumet.   Juliette Alcideustin Zeigler, PharmD, BCPS.   Pager: [phone] 07/31/2014 8:04 AM

## 2014-07-31 NOTE — Progress Notes (Signed)
CSW consulted for SNF placement. PN reviewed. Pt is planning to return home following hospital d/c. RNCM is assisting with d/c planning.  Rajah Tagliaferro LCSW [phone] 

## 2014-07-31 NOTE — Progress Notes (Signed)
Occupational Therapy Treatment Patient Details Name: Cassidy Green Today's Date: 07/31/2014    History of present illness pt is s/p L DA THA   OT comments  Pt is making good progress in OT.  Will plan to see once more to practice shower transfer  Follow Up Recommendations  Supervision/Assistance - 24 hour    Equipment Recommendations  3 in 1 bedside comode (delivered)    Recommendations for Other Services      Precautions / Restrictions Precautions Precautions: Fall Restrictions Other Position/Activity Restrictions: WBAT       Mobility Bed Mobility   Bed Mobility: Sit to Supine       Sit to supine: Min assist   General bed mobility comments: assist for LLE and cues for technique  Transfers   Equipment used: Rolling walker (2 wheeled) Transfers: Sit to/from Stand Sit to Stand: Min assist (from bed and commode; cues for UE/LE placement)              Balance                                   ADL                       Lower Body Dressing: Minimal assistance;With adaptive equipment;Sit to/from stand   Toilet Transfer: Minimal assistance;Ambulation;BSC;RW   Toileting- Clothing Manipulation and Hygiene: Minimal assistance;Sit to/from stand         General ADL Comments: ambulated to bathroom with min guard, min A for sit to stand.  Educated on sidestepping through tight spaces.  Practiced with reacher and sock aide to don underwear and socks.  Educated on shower transfer but pt is not ready to practice yet.      Vision                     Perception     Praxis      Cognition   Behavior During Therapy: WFL for tasks assessed/performed Overall Cognitive Status: Within Functional Limits for tasks assessed                       Extremity/Trunk Assessment               Exercises     Shoulder Instructions       General Comments      Pertinent Vitals/ Pain       Pain  Score: 2  Pain Location: L hip Pain Descriptors / Indicators: Sore Pain Intervention(s): Limited activity within patient's tolerance;Monitored during session;Premedicated before session;Ice applied;Repositioned;RN gave pain meds during session  Home Living                                          Prior Functioning/Environment              Frequency Min 2X/week     Progress Toward Goals  OT Goals(current goals can now be found in the care plan section)  Progress towards OT goals: Progressing toward goals     Plan      Co-evaluation                 End of Session     Activity Tolerance Patient tolerated treatment well  Patient Left in chair;with call bell/phone within reach   Nurse Communication          Time: 1610-9604 OT Time Calculation (min): 20 min  Charges: OT General Charges $OT Visit: 1 Procedure OT Treatments $Self Care/Home Management : 8-22 mins  Wenceslaus Gist 07/31/2014, 2:29 PM  Marica Otter, OTR/L [phone] 07/31/2014

## 2014-08-01 LAB — CBC
HCT: 22.2 % — ABNORMAL LOW (ref 36.0–46.0)
HEMOGLOBIN: 7.1 g/dL — AB (ref 12.0–15.0)
MCH: 27.5 pg (ref 26.0–34.0)
MCHC: 32 g/dL (ref 30.0–36.0)
MCV: 86 fL (ref 78.0–100.0)
Platelets: 160 10*3/uL (ref 150–400)
RBC: 2.58 MIL/uL — ABNORMAL LOW (ref 3.87–5.11)
RDW: 16.1 % — AB (ref 11.5–15.5)
WBC: 8.2 10*3/uL (ref 4.0–10.5)

## 2014-08-01 LAB — GLUCOSE, CAPILLARY: GLUCOSE-CAPILLARY: 98 mg/dL (ref 70–99)

## 2014-08-01 NOTE — Progress Notes (Signed)
Subjective: 2 Days Post-Op Procedure(s) (LRB): LEFT TOTAL HIP ARTHROPLASTY ANTERIOR APPROACH (Left) Patient reports pain as mild.  OOB with PT yesterday.  No c/o.  Ready to go home.  Denies SOB, dizziness, CP.  Objective: Vital signs in last 24 hours: Temp:  [98.4 F (36.9 C)-99.7 F (37.6 C)] 99.7 F (37.6 C) (03/05 0649) Pulse Rate:  [96-114] 110 (03/05 0649) Resp:  [16] 16 (03/05 0649) BP: (114-123)/(59-76) 123/76 mmHg (03/05 0649) SpO2:  [95 %-98 %] 96 % (03/05 0649)  Intake/Output from previous day: 03/04 0701 - 03/05 0700 In: 1842.5 [P.O.:720; I.V.:1122.5] Out: 2100 [Urine:2100] Intake/Output this shift: Total I/O In: -  Out: 150 [Urine:150]   Recent Labs  07/31/14 0437 08/01/14 0417  HGB 8.2* 7.1*    Recent Labs  07/31/14 0437 08/01/14 0417  WBC 10.6* 8.2  RBC 2.94* 2.58*  HCT 25.2* 22.2*  PLT 188 160    Recent Labs  07/31/14 0437  NA 138  K 4.3  CL 108  CO2 22  BUN 20  CREATININE 1.16*  GLUCOSE 147*  CALCIUM 8.6   No results for input(s): LABPT, INR in the last 72 hours.  PE:  left hip wound dressed and dry. NVI at L LE.  Assessment/Plan: 2 Days Post-Op Procedure(s) (LRB): LEFT TOTAL HIP ARTHROPLASTY ANTERIOR APPROACH (Left) Discharge home with home health  WBAT on L LE.  Olina Melfi 08/01/2014, 8:56 AM

## 2014-08-01 NOTE — Progress Notes (Signed)
Physical Therapy Treatment Patient Details Name: Cassidy MartinetSusan Green MRN: [id number] DOB: 1955-07-07 Today's Date: 08/01/2014    History of Present Illness pt is s/p L DA THA    PT Comments    POD # 2 pt feeling better and eager to D/C to home today.  Assisted OOB to amb in hallway.  Practiced stairs.  Performed THR TE's following HEP handout.  Instructed on use of ICE.  Instructed on HEP frequency and proper tech.  Instructed on car transfers.  Pt ready for D/C to home this morning.  Follow Up Recommendations  Home health PT     Equipment Recommendations  Rolling walker with 5" wheels (youth)    Recommendations for Other Services       Precautions / Restrictions Precautions Precautions: Fall Restrictions Weight Bearing Restrictions: No Other Position/Activity Restrictions: WBAT    Mobility  Bed Mobility Overal bed mobility: Needs Assistance Bed Mobility: Supine to Sit;Sit to Supine           General bed mobility comments: demonstarted and instructed pt how to use a belt to assist L LE on/off bed  Transfers Overall transfer level: Needs assistance Equipment used: Rolling walker (2 wheeled) Transfers: Sit to/from Stand Sit to Stand: Supervision         General transfer comment: <25% VC's on proper hand placement and turn completion.    Ambulation/Gait Ambulation/Gait assistance: Supervision Ambulation Distance (Feet): 115 Feet Assistive device: Rolling walker (2 wheeled) Gait Pattern/deviations: Step-to pattern;Step-through pattern Gait velocity: decreased but functional   General Gait Details: one VC safety with turns.  Good steady gait.   Stairs Stairs: Yes Stairs assistance: Min guard Stair Management: One rail Right Number of Stairs: 2 General stair comments: 25% VC's on proper sequencing and safety using rail  Wheelchair Mobility    Modified Rankin (Stroke Patients Only)       Balance                                    Cognition  Arousal/Alertness: Awake/alert Behavior During Therapy: WFL for tasks assessed/performed Overall Cognitive Status: Within Functional Limits for tasks assessed                      Exercises   Total Hip Replacement TE's 10 reps ankle pumps 10 reps knee presses 10 reps heel slides 10 reps SAQ's 10 reps ABD Followed by ICE     General Comments        Pertinent Vitals/Pain Pain Assessment: 0-10 Pain Score: 6  Pain Location: L hip Pain Descriptors / Indicators: Sore Pain Intervention(s): Monitored during session;RN gave pain meds during session;Repositioned;Ice applied    Home Living                      Prior Function            PT Goals (current goals can now be found in the care plan section) Progress towards PT goals: Progressing toward goals    Frequency  7X/week    PT Plan      Co-evaluation             End of Session Equipment Utilized During Treatment: Gait belt Activity Tolerance: Patient tolerated treatment well Patient left: in bed;with call bell/phone within reach     Time: 0816-0859 PT Time Calculation (min) (ACUTE ONLY): 43 min  Charges:  $Gait Training: 8-22 mins $  Therapeutic Exercise: 8-22 mins $Therapeutic Activity: 8-22 mins                    G Codes:      Armando Reichert 08-15-2014, 9:16 AM

## 2014-08-01 NOTE — Progress Notes (Signed)
Occupational Therapy Treatment Patient Details Name: Cassidy Green MRN: [id number] DOB: March 21, 1956 Today's Date: 08/01/2014    History of present illness pt is s/p L DA THA   OT comments  All OT education completed and patient to d/c home today. OT will sign off.  Follow Up Recommendations  Supervision/Assistance - 24 hour    Equipment Recommendations  3 in 1 bedside comode    Recommendations for Other Services      Precautions / Restrictions Precautions Precautions: Fall Restrictions Weight Bearing Restrictions: No Other Position/Activity Restrictions: WBAT       Mobility Bed Mobility Overal bed mobility: Needs Assistance Bed Mobility: Supine to Sit;Sit to Supine     Supine to sit: Supervision Sit to supine: Min assist   General bed mobility comments: demonstarted and instructed pt how to use a belt to assist L LE on/off bed  Transfers Overall transfer level: Needs assistance Equipment used: Rolling walker (2 wheeled) Transfers: Sit to/from Stand Sit to Stand: Supervision         General transfer comment: <25% VC's on proper hand placement and turn completion.      Balance                                   ADL Overall ADL's : Needs assistance/impaired     Grooming: Wash/dry Radiographer, therapeutic: Supervision/safety;Ambulation;BSC;RW   Toileting- Clothing Manipulation and Hygiene: Supervision/safety;Sit to/from stand   Tub/ Shower Transfer: Walk-in shower;Supervision/safety;Cueing for sequencing;3 in 1;Rolling walker   Functional mobility during ADLs: Supervision/safety;Rolling walker General ADL Comments: Performed shower transfer with S and cues for sequencing. Patient eudcated to have someone with her the first few times she showers. Patient verbalized understanding. Patient also toileted S overall for task with 3 in1  over toilet. Stood at sink to wash hands S.      Vision                     Perception     Praxis      Cognition   Behavior During Therapy: WFL for tasks assessed/performed Overall Cognitive Status: Within Functional Limits for tasks assessed                       Extremity/Trunk Assessment               Exercises     Shoulder Instructions       General Comments      Pertinent Vitals/ Pain       Pain Assessment: 0-10 Pain Score: 2  Pain Location: L hip Pain Descriptors / Indicators: Sore Pain Intervention(s): Monitored during session  Home Living                                          Prior Functioning/Environment              Frequency       Progress Toward Goals  OT Goals(current goals can now be found in the care plan section)  Progress towards OT goals: Goals met/education completed, patient discharged from [facility] All goals met and education completed, patient discharged from OT services    Co-evaluation  End of Session Equipment Utilized During Treatment: Rolling walker   Activity Tolerance Patient tolerated treatment well   Patient Left in bed;with call bell/phone within reach   Nurse Communication          Time: [phone] OT Time Calculation (min): 15 min  Charges: OT General Charges $OT Visit: 1 Procedure OT Treatments $Self Care/Home Management : 8-22 mins  Margarita Bobrowski A 08/01/2014, 9:50 AM

## 2014-08-01 NOTE — Progress Notes (Signed)
Discharged from floor via w/c, belongings, equipment & spouse with pt. No changes in assessment. Cassidy Green, [name]

## 2014-08-01 NOTE — Plan of Care (Signed)
Problem: Discharge Progression Outcomes Goal: Anticoagulant follow-up in place Outcome: Not Applicable Date Met:  07/26/38 asa

## 2014-08-03 ENCOUNTER — Encounter (HOSPITAL_COMMUNITY): Payer: Self-pay | Admitting: Orthopedic Surgery

## 2014-08-03 NOTE — Progress Notes (Signed)
Discharge summary sent to payer through [facility]  

## 2014-09-04 ENCOUNTER — Encounter (HOSPITAL_COMMUNITY)
Admission: RE | Disposition: A | Discharge status: Home / Self Care | Payer: Self-pay | Source: Ambulatory Visit | Attending: Orthopedic Surgery

## 2014-09-04 ENCOUNTER — Observation Stay (HOSPITAL_COMMUNITY)
Admission: RE | Admit: 2014-09-04 | Discharge: 2014-09-05 | Disposition: A | Discharge status: Home / Self Care | Payer: BLUE CROSS/BLUE SHIELD | Source: Ambulatory Visit | Attending: Orthopedic Surgery | Admitting: Orthopedic Surgery

## 2014-09-04 ENCOUNTER — Ambulatory Visit (HOSPITAL_COMMUNITY): Payer: BLUE CROSS/BLUE SHIELD | Admitting: Anesthesiology

## 2014-09-04 ENCOUNTER — Encounter (HOSPITAL_COMMUNITY): Payer: Self-pay | Admitting: *Deleted

## 2014-09-04 DIAGNOSIS — E119 Type 2 diabetes mellitus without complications: Secondary | ICD-10-CM | POA: Diagnosis not present

## 2014-09-04 DIAGNOSIS — I10 Essential (primary) hypertension: Secondary | ICD-10-CM | POA: Insufficient documentation

## 2014-09-04 DIAGNOSIS — E785 Hyperlipidemia, unspecified: Secondary | ICD-10-CM | POA: Diagnosis not present

## 2014-09-04 DIAGNOSIS — F419 Anxiety disorder, unspecified: Secondary | ICD-10-CM | POA: Diagnosis not present

## 2014-09-04 DIAGNOSIS — Y838 Other surgical procedures as the cause of abnormal reaction of the patient, or of later complication, without mention of misadventure at the time of the procedure: Secondary | ICD-10-CM | POA: Insufficient documentation

## 2014-09-04 DIAGNOSIS — K219 Gastro-esophageal reflux disease without esophagitis: Secondary | ICD-10-CM | POA: Diagnosis not present

## 2014-09-04 DIAGNOSIS — T8130XA Disruption of wound, unspecified, initial encounter: Secondary | ICD-10-CM | POA: Diagnosis not present

## 2014-09-04 DIAGNOSIS — IMO0001 Reserved for inherently not codable concepts without codable children: Secondary | ICD-10-CM

## 2014-09-04 DIAGNOSIS — Z87891 Personal history of nicotine dependence: Secondary | ICD-10-CM | POA: Insufficient documentation

## 2014-09-04 DIAGNOSIS — T8149XA Infection following a procedure, other surgical site, initial encounter: Secondary | ICD-10-CM

## 2014-09-04 DIAGNOSIS — T814XXA Infection following a procedure, initial encounter: Secondary | ICD-10-CM

## 2014-09-04 DIAGNOSIS — T8141XA Infection following a procedure, superficial incisional surgical site, initial encounter: Secondary | ICD-10-CM | POA: Diagnosis present

## 2014-09-04 HISTORY — PX: INCISION AND DRAINAGE HIP: SHX1801

## 2014-09-04 LAB — BASIC METABOLIC PANEL
ANION GAP: 9 (ref 5–15)
BUN: 23 mg/dL (ref 6–23)
CHLORIDE: 117 mmol/L — AB (ref 96–112)
CO2: 18 mmol/L — ABNORMAL LOW (ref 19–32)
Calcium: 9.3 mg/dL (ref 8.4–10.5)
Creatinine, Ser: 1.55 mg/dL — ABNORMAL HIGH (ref 0.50–1.10)
GFR calc Af Amer: 41 mL/min — ABNORMAL LOW (ref >90)
GFR calc non Af Amer: 36 mL/min — ABNORMAL LOW (ref >90)
Glucose, Bld: 82 mg/dL (ref 70–99)
Potassium: 4.6 mmol/L (ref 3.5–5.1)
SODIUM: 144 mmol/L (ref 135–145)

## 2014-09-04 LAB — SYNOVIAL CELL COUNT + DIFF, W/ CRYSTALS
Crystals, Fluid: NONE SEEN
Eosinophils-Synovial: 6 % — ABNORMAL HIGH (ref 0–1)
Lymphocytes-Synovial Fld: 44 % — ABNORMAL HIGH (ref 0–20)
Monocyte-Macrophage-Synovial Fluid: 36 % — ABNORMAL LOW (ref 50–90)
Neutrophil, Synovial: 14 % (ref 0–25)
WBC, Synovial: 788 /mm3 — ABNORMAL HIGH (ref 0–200)

## 2014-09-04 LAB — GLUCOSE, CAPILLARY
GLUCOSE-CAPILLARY: 89 mg/dL (ref 70–99)
GLUCOSE-CAPILLARY: 82 mg/dL (ref 70–99)
GLUCOSE-CAPILLARY: 129 mg/dL — AB (ref 70–99)
Glucose-Capillary: 76 mg/dL (ref 70–99)

## 2014-09-04 LAB — ABO/RH: ABO/RH(D): B POS

## 2014-09-04 LAB — CBC
HEMATOCRIT: 28.2 % — AB (ref 36.0–46.0)
Hemoglobin: 8.6 g/dL — ABNORMAL LOW (ref 12.0–15.0)
MCH: 26.9 pg (ref 26.0–34.0)
MCHC: 30.5 g/dL (ref 30.0–36.0)
MCV: 88.1 fL (ref 78.0–100.0)
PLATELETS: 281 10*3/uL (ref 150–400)
RBC: 3.2 MIL/uL — ABNORMAL LOW (ref 3.87–5.11)
RDW: 16.3 % — ABNORMAL HIGH (ref 11.5–15.5)
WBC: 6.7 10*3/uL (ref 4.0–10.5)

## 2014-09-04 LAB — PREPARE RBC (CROSSMATCH)

## 2014-09-04 SURGERY — IRRIGATION AND DEBRIDEMENT HIP
Anesthesia: General | Site: Hip | Laterality: Left

## 2014-09-04 MED ORDER — ONDANSETRON HCL 4 MG/2ML IJ SOLN
INTRAMUSCULAR | Status: DC | PRN
  Administered 2014-09-04: 4 mg via INTRAVENOUS

## 2014-09-04 MED ORDER — DEXAMETHASONE SODIUM PHOSPHATE 4 MG/ML IJ SOLN
INTRAMUSCULAR | Status: DC | PRN
  Administered 2014-09-04: 4 mg via INTRAVENOUS

## 2014-09-04 MED ORDER — KETOROLAC TROMETHAMINE 15 MG/ML IJ SOLN
INTRAMUSCULAR | Status: AC
  Filled 2014-09-04: qty 1

## 2014-09-04 MED ORDER — ASPIRIN EC 325 MG PO TBEC
325.0000 mg | DELAYED_RELEASE_TABLET | Freq: Two times a day (BID) | ORAL | Status: DC
  Administered 2014-09-05: 325 mg via ORAL
  Filled 2014-09-04: qty 1

## 2014-09-04 MED ORDER — HYDROCODONE-ACETAMINOPHEN 5-325 MG PO TABS
ORAL_TABLET | ORAL | Status: AC
  Filled 2014-09-04: qty 1

## 2014-09-04 MED ORDER — LIDOCAINE HCL (CARDIAC) 20 MG/ML IV SOLN
INTRAVENOUS | Status: AC
  Filled 2014-09-04: qty 5

## 2014-09-04 MED ORDER — CEFAZOLIN SODIUM 1-5 GM-% IV SOLN
1.0000 g | Freq: Four times a day (QID) | INTRAVENOUS | Status: AC
  Administered 2014-09-04 – 2014-09-05 (×3): 1 g via INTRAVENOUS
  Filled 2014-09-04 (×3): qty 50

## 2014-09-04 MED ORDER — LIDOCAINE HCL (CARDIAC) 20 MG/ML IV SOLN
INTRAVENOUS | Status: DC | PRN
  Administered 2014-09-04: 80 mg via INTRAVENOUS
  Administered 2014-09-04: 100 mg via INTRATRACHEAL

## 2014-09-04 MED ORDER — DEXAMETHASONE SODIUM PHOSPHATE 4 MG/ML IJ SOLN
INTRAMUSCULAR | Status: AC
  Filled 2014-09-04: qty 1

## 2014-09-04 MED ORDER — PROPOFOL 10 MG/ML IV BOLUS
INTRAVENOUS | Status: DC | PRN
  Administered 2014-09-04: 140 mg via INTRAVENOUS

## 2014-09-04 MED ORDER — 0.9 % SODIUM CHLORIDE (POUR BTL) OPTIME
TOPICAL | Status: DC | PRN
  Administered 2014-09-04 (×2): 1000 mL

## 2014-09-04 MED ORDER — SODIUM CHLORIDE 0.9 % IV SOLN
INTRAVENOUS | Status: DC
  Administered 2014-09-04: 75 mL/h via INTRAVENOUS

## 2014-09-04 MED ORDER — ALPRAZOLAM 0.25 MG PO TABS: 0.2500 mg | ORAL_TABLET | Freq: Every day | ORAL | Status: DC | PRN

## 2014-09-04 MED ORDER — CEFAZOLIN SODIUM-DEXTROSE 2-3 GM-% IV SOLR
2.0000 g | Freq: Once | INTRAVENOUS | Status: AC
  Administered 2014-09-04: 2 g via INTRAVENOUS

## 2014-09-04 MED ORDER — HYDROMORPHONE HCL 1 MG/ML IJ SOLN
INTRAMUSCULAR | Status: AC
  Filled 2014-09-04: qty 1

## 2014-09-04 MED ORDER — LISINOPRIL 20 MG PO TABS
20.0000 mg | ORAL_TABLET | Freq: Every day | ORAL | Status: DC
  Administered 2014-09-04: 20 mg via ORAL
  Filled 2014-09-04: qty 1

## 2014-09-04 MED ORDER — ACETAMINOPHEN 650 MG RE SUPP: 650.0000 mg | Freq: Four times a day (QID) | RECTAL | Status: DC | PRN

## 2014-09-04 MED ORDER — EZETIMIBE 10 MG PO TABS
10.0000 mg | ORAL_TABLET | Freq: Every day | ORAL | Status: DC
  Administered 2014-09-04: 10 mg via ORAL
  Filled 2014-09-04: qty 1

## 2014-09-04 MED ORDER — ACETAMINOPHEN 325 MG PO TABS
650.0000 mg | ORAL_TABLET | Freq: Four times a day (QID) | ORAL | Status: DC | PRN
Start: 2014-09-04 — End: 2014-09-05

## 2014-09-04 MED ORDER — KETOROLAC TROMETHAMINE 15 MG/ML IJ SOLN
15.0000 mg | Freq: Four times a day (QID) | INTRAMUSCULAR | Status: DC
  Administered 2014-09-04 – 2014-09-05 (×3): 15 mg via INTRAVENOUS
  Filled 2014-09-04 (×2): qty 1

## 2014-09-04 MED ORDER — MELOXICAM 15 MG PO TABS
15.0000 mg | ORAL_TABLET | Freq: Every day | ORAL | Status: DC
  Filled 2014-09-04: qty 1

## 2014-09-04 MED ORDER — PANTOPRAZOLE SODIUM 40 MG PO TBEC
80.0000 mg | DELAYED_RELEASE_TABLET | Freq: Every day | ORAL | Status: DC
  Administered 2014-09-04 – 2014-09-05 (×2): 80 mg via ORAL
  Filled 2014-09-04 (×2): qty 2

## 2014-09-04 MED ORDER — ONDANSETRON HCL 4 MG/2ML IJ SOLN: 4.0000 mg | Freq: Four times a day (QID) | INTRAMUSCULAR | Status: DC | PRN

## 2014-09-04 MED ORDER — FENTANYL CITRATE 0.05 MG/ML IJ SOLN
INTRAMUSCULAR | Status: AC
  Filled 2014-09-04: qty 5

## 2014-09-04 MED ORDER — HYDROCODONE-ACETAMINOPHEN 5-325 MG PO TABS
1.0000 | ORAL_TABLET | ORAL | Status: DC | PRN
  Administered 2014-09-04: 2 via ORAL
  Administered 2014-09-04: 1 via ORAL
  Administered 2014-09-05 (×2): 2 via ORAL
  Filled 2014-09-04 (×3): qty 2

## 2014-09-04 MED ORDER — METOCLOPRAMIDE HCL 5 MG/ML IJ SOLN
5.0000 mg | Freq: Three times a day (TID) | INTRAMUSCULAR | Status: DC | PRN
Start: 2014-09-04 — End: 2014-09-05

## 2014-09-04 MED ORDER — SITAGLIPTIN PHOS-METFORMIN HCL 50-1000 MG PO TABS: 1.0000 | ORAL_TABLET | Freq: Every day | ORAL | Status: DC

## 2014-09-04 MED ORDER — METOCLOPRAMIDE HCL 5 MG PO TABS: 5.0000 mg | ORAL_TABLET | Freq: Three times a day (TID) | ORAL | Status: DC | PRN

## 2014-09-04 MED ORDER — MIDAZOLAM HCL 2 MG/2ML IJ SOLN
INTRAMUSCULAR | Status: AC
  Filled 2014-09-04: qty 2

## 2014-09-04 MED ORDER — LINAGLIPTIN 5 MG PO TABS
5.0000 mg | ORAL_TABLET | Freq: Every day | ORAL | Status: DC
  Administered 2014-09-04 – 2014-09-05 (×2): 5 mg via ORAL
  Filled 2014-09-04 (×2): qty 1

## 2014-09-04 MED ORDER — ONDANSETRON HCL 4 MG/2ML IJ SOLN
INTRAMUSCULAR | Status: AC
  Filled 2014-09-04: qty 2

## 2014-09-04 MED ORDER — DOCUSATE SODIUM 100 MG PO CAPS
100.0000 mg | ORAL_CAPSULE | Freq: Two times a day (BID) | ORAL | Status: DC
  Administered 2014-09-04 – 2014-09-05 (×2): 100 mg via ORAL
  Filled 2014-09-04 (×2): qty 1

## 2014-09-04 MED ORDER — ONDANSETRON HCL 4 MG PO TABS: 4.0000 mg | ORAL_TABLET | Freq: Four times a day (QID) | ORAL | Status: DC | PRN

## 2014-09-04 MED ORDER — FENTANYL CITRATE 0.05 MG/ML IJ SOLN
INTRAMUSCULAR | Status: DC | PRN
Start: 2014-09-04 — End: 2014-09-04
  Administered 2014-09-04 (×2): 50 ug via INTRAVENOUS
  Administered 2014-09-04: 100 ug via INTRAVENOUS
  Administered 2014-09-04: 50 ug via INTRAVENOUS

## 2014-09-04 MED ORDER — HYDROMORPHONE HCL 1 MG/ML IJ SOLN
0.2500 mg | INTRAMUSCULAR | Status: DC | PRN
  Administered 2014-09-04 (×3): 0.5 mg via INTRAVENOUS

## 2014-09-04 MED ORDER — LACTATED RINGERS IV SOLN
INTRAVENOUS | Status: DC
  Administered 2014-09-04: 15:00:00 via INTRAVENOUS

## 2014-09-04 MED ORDER — LACTATED RINGERS IV SOLN
INTRAVENOUS | Status: DC | PRN
  Administered 2014-09-04 (×2): via INTRAVENOUS

## 2014-09-04 MED ORDER — SUCCINYLCHOLINE CHLORIDE 20 MG/ML IJ SOLN
INTRAMUSCULAR | Status: DC | PRN
  Administered 2014-09-04: 100 mg via INTRAVENOUS

## 2014-09-04 MED ORDER — CEFAZOLIN SODIUM-DEXTROSE 2-3 GM-% IV SOLR
INTRAVENOUS | Status: AC
  Filled 2014-09-04: qty 50

## 2014-09-04 MED ORDER — MIDAZOLAM HCL 5 MG/5ML IJ SOLN
INTRAMUSCULAR | Status: DC | PRN
  Administered 2014-09-04: 2 mg via INTRAVENOUS

## 2014-09-04 MED ORDER — PROMETHAZINE HCL 25 MG/ML IJ SOLN
6.2500 mg | INTRAMUSCULAR | Status: DC | PRN
Start: 2014-09-04 — End: 2014-09-04

## 2014-09-04 SURGICAL SUPPLY — 48 items
COVER SURGICAL LIGHT HANDLE (MISCELLANEOUS) ×3 IMPLANT
DRAPE IMP U-DRAPE 54X76 (DRAPES) ×3 IMPLANT
DRAPE INCISE IOBAN 85X60 (DRAPES) IMPLANT
DRSG ADAPTIC 3X8 NADH LF (GAUZE/BANDAGES/DRESSINGS) ×3 IMPLANT
DRSG AQUACEL AG ADV 3.5X10 (GAUZE/BANDAGES/DRESSINGS) ×3 IMPLANT
DRSG MEPILEX BORDER 4X8 (GAUZE/BANDAGES/DRESSINGS) IMPLANT
DRSG PAD ABDOMINAL 8X10 ST (GAUZE/BANDAGES/DRESSINGS) ×3 IMPLANT
DURAPREP 26ML APPLICATOR (WOUND CARE) IMPLANT
ELECT CAUTERY BLADE 6.4 (BLADE) ×3 IMPLANT
ELECT REM PT RETURN 9FT ADLT (ELECTROSURGICAL) ×3
ELECTRODE REM PT RTRN 9FT ADLT (ELECTROSURGICAL) ×1 IMPLANT
EVACUATOR 1/8 PVC DRAIN (DRAIN) IMPLANT
FACESHIELD WRAPAROUND (MASK) IMPLANT
GLOVE BIO SURGEON STRL SZ8.5 (GLOVE) ×9 IMPLANT
GLOVE BIOGEL PI IND STRL 6.5 (GLOVE) ×2 IMPLANT
GLOVE BIOGEL PI IND STRL 8.5 (GLOVE) ×3 IMPLANT
GLOVE BIOGEL PI INDICATOR 6.5 (GLOVE) ×4
GLOVE BIOGEL PI INDICATOR 8.5 (GLOVE) ×6
GLOVE NEODERM STRL 7.5 LF PF (GLOVE) ×2 IMPLANT
GLOVE SURG NEODERM 7.5  LF PF (GLOVE) ×4
GOWN STRL REIN XL XLG (GOWN DISPOSABLE) ×3 IMPLANT
GOWN STRL REUS W/TWL 2XL LVL3 (GOWN DISPOSABLE) ×9 IMPLANT
HANDPIECE INTERPULSE COAX TIP (DISPOSABLE) ×2
HOOD SURGICAL BLUE (PROTECTIVE WEAR) ×9 IMPLANT
KIT BASIN OR (CUSTOM PROCEDURE TRAY) ×3 IMPLANT
KIT ROOM TURNOVER OR (KITS) ×3 IMPLANT
MANIFOLD NEPTUNE II (INSTRUMENTS) ×3 IMPLANT
NS IRRIG 1000ML POUR BTL (IV SOLUTION) ×12 IMPLANT
PACK SHOULDER (CUSTOM PROCEDURE TRAY) ×3 IMPLANT
PACK UNIVERSAL I (CUSTOM PROCEDURE TRAY) ×3 IMPLANT
PAD ARMBOARD 7.5X6 YLW CONV (MISCELLANEOUS) ×6 IMPLANT
SET HNDPC FAN SPRY TIP SCT (DISPOSABLE) ×1 IMPLANT
SPONGE GAUZE 4X4 12PLY STER LF (GAUZE/BANDAGES/DRESSINGS) ×3 IMPLANT
STAPLER VISISTAT 35W (STAPLE) IMPLANT
SUT ETHILON 2 0 FSLX (SUTURE) ×6 IMPLANT
SUT ETHILON 2 0 PSLX (SUTURE) ×6 IMPLANT
SUT ETHILON 3 0 PS 1 (SUTURE) ×3 IMPLANT
SUT MON AB 2-0 CT1 36 (SUTURE) ×3 IMPLANT
SUT PDS AB 1 CT  36 (SUTURE) ×4
SUT PDS AB 1 CT 36 (SUTURE) ×2 IMPLANT
SUT VIC AB 0 CT1 27 (SUTURE)
SUT VIC AB 0 CT1 27XBRD ANBCTR (SUTURE) IMPLANT
SUT VIC AB 1 CTB1 27 (SUTURE) IMPLANT
SUT VIC AB 2-0 CT1 27 (SUTURE)
SUT VIC AB 2-0 CT1 TAPERPNT 27 (SUTURE) IMPLANT
TAPE CLOTH SURG 6X10 WHT LF (GAUZE/BANDAGES/DRESSINGS) ×3 IMPLANT
TOWEL OR 17X24 6PK STRL BLUE (TOWEL DISPOSABLE) ×3 IMPLANT
TOWEL OR 17X26 10 PK STRL BLUE (TOWEL DISPOSABLE) ×3 IMPLANT

## 2014-09-04 NOTE — OR Nursing (Signed)
Pt received 1.5 mg of Dilaudid in PACU, not 2 mg. 0.5mg  wasted on the incorrect patient. Witnessed by Dahlia Bailiffobin Roberts, RN.

## 2014-09-04 NOTE — Anesthesia Procedure Notes (Signed)
Procedure Name: Intubation Date/Time: 09/04/2014 4:13 PM Performed by: Lovie CholOCK, Alejah Aristizabal K Pre-anesthesia Checklist: Patient identified, Emergency Drugs available, Suction available, Patient being monitored and Timeout performed Patient Re-evaluated:Patient Re-evaluated prior to inductionOxygen Delivery Method: Circle system utilized Preoxygenation: Pre-oxygenation with 100% oxygen Intubation Type: IV induction Ventilation: Mask ventilation without difficulty Laryngoscope Size: Miller and 2 Grade View: Grade I Tube type: Oral Tube size: 7.0 mm Number of attempts: 1 Airway Equipment and Method: Stylet Placement Confirmation: ETT inserted through vocal cords under direct vision,  positive ETCO2,  CO2 detector and breath sounds checked- equal and bilateral Secured at: 22 cm Tube secured with: Tape Dental Injury: Teeth and Oropharynx as per pre-operative assessment

## 2014-09-04 NOTE — Brief Op Note (Signed)
09/04/2014  5:55 PM  PATIENT:  Cassidy Green  59 y.o. female  PRE-OPERATIVE DIAGNOSIS:  left hip stitch abscess  POST-OPERATIVE DIAGNOSIS:  left hip stitch abscess  PROCEDURE:  Procedure(s): IRRIGATION AND DEBRIDEMENT  LEFT HIP WOUND WITH CLOSURE  (Left)  SURGEON:  Surgeon(s) and Role:    * Samson FredericBrian Li Fragoso, MD - Primary  PHYSICIAN ASSISTANT:   ASSISTANTS: Hart CarwinJustin Queen, RNFA   ANESTHESIA:   general  EBL:  Total I/O In: 1700 [I.V.:1700] Out: -   BLOOD ADMINISTERED:none  DRAINS: (medium) Hemovact drain(s) in the subcu with  Suction Open   LOCAL MEDICATIONS USED:  NONE  SPECIMEN:  Aspirate (synovial fluid for cell count and culture)  DISPOSITION OF SPECIMEN:  micro  COUNTS:  YES  TOURNIQUET:  * No tourniquets in log *  DICTATION: .Other Dictation: Dictation Number [id number]  PLAN OF CARE: Admit for overnight observation  PATIENT DISPOSITION:  PACU - hemodynamically stable.   Delay start of Pharmacological VTE agent (>24hrs) due to surgical blood loss or risk of bleeding: no

## 2014-09-04 NOTE — Transfer of Care (Signed)
Immediate Anesthesia Transfer of Care Note  Patient: Cassidy Green  Procedure(s) Performed: Procedure(s): IRRIGATION AND DEBRIDEMENT  LEFT HIP WOUND WITH CLOSURE  (Left)  Patient Location: PACU  Anesthesia Type:General  Level of Consciousness: awake, alert , oriented and patient cooperative  Airway & Oxygen Therapy: Patient Spontanous Breathing  Post-op Assessment: Report given to RN, Post -op Vital signs reviewed and stable and Patient moving all extremities  Post vital signs: Reviewed and stable  Last Vitals:  Filed Vitals:   09/04/14 1303  BP: 128/71  Pulse: 72  Temp: 36.6 C    Complications: No apparent anesthesia complications

## 2014-09-04 NOTE — Anesthesia Postprocedure Evaluation (Signed)
  Anesthesia Post-op Note  Patient: Cassidy CandySusan S Sarrazin  Procedure(s) Performed: Procedure(s): IRRIGATION AND DEBRIDEMENT  LEFT HIP WOUND WITH CLOSURE  (Left)  Patient Location: PACU  Anesthesia Type:General  Level of Consciousness: awake, alert  and oriented  Airway and Oxygen Therapy: Patient Spontanous Breathing and Patient connected to nasal cannula oxygen  Post-op Pain: mild  Post-op Assessment: Post-op Vital signs reviewed, Patient's Cardiovascular Status Stable, Respiratory Function Stable, Patent Airway and Pain level controlled  Post-op Vital Signs: stable  Last Vitals:  Filed Vitals:   09/04/14 1759  BP: 107/65  Pulse: 100  Temp: 36.4 C  Resp: 16    Complications: No apparent anesthesia complications

## 2014-09-04 NOTE — Anesthesia Preprocedure Evaluation (Addendum)
Anesthesia Evaluation  Patient identified by MRN, date of birth, ID band Patient awake    Reviewed: Allergy & Precautions, NPO status , Patient's Chart, lab work & pertinent test results  Airway Mallampati: II  TM Distance: >3 FB Neck ROM: Full    Dental no notable dental hx.    Pulmonary neg pulmonary ROS, former smoker,  breath sounds clear to auscultation  Pulmonary exam normal       Cardiovascular hypertension, Rhythm:Regular Rate:Normal     Neuro/Psych negative neurological ROS  negative psych ROS   GI/Hepatic Neg liver ROS, GERD-  Medicated,  Endo/Other  diabetes  Renal/GU negative Renal ROS  negative genitourinary   Musculoskeletal negative musculoskeletal ROS (+)   Abdominal   Peds negative pediatric ROS (+)  Hematology  (+) anemia ,   Anesthesia Other Findings   Reproductive/Obstetrics negative OB ROS                            Anesthesia Physical Anesthesia Plan  ASA: II  Anesthesia Plan: General   Post-op Pain Management:    Induction: Intravenous  Airway Management Planned: Oral ETT  Additional Equipment:   Intra-op Plan:   Post-operative Plan: Extubation in OR  Informed Consent: I have reviewed the patients History and Physical, chart, labs and discussed the procedure including the risks, benefits and alternatives for the proposed anesthesia with the patient or authorized representative who has indicated his/her understanding and acceptance.   Dental advisory given  Plan Discussed with: CRNA, Surgeon and Anesthesiologist  Anesthesia Plan Comments:        Anesthesia Quick Evaluation

## 2014-09-05 DIAGNOSIS — T8130XA Disruption of wound, unspecified, initial encounter: Secondary | ICD-10-CM | POA: Diagnosis not present

## 2014-09-05 LAB — GLUCOSE, CAPILLARY: GLUCOSE-CAPILLARY: 121 mg/dL — AB (ref 70–99)

## 2014-09-05 MED ORDER — CEFADROXIL 500 MG PO CAPS: 500.0000 mg | ORAL_CAPSULE | Freq: Two times a day (BID) | ORAL | Status: DC

## 2014-09-05 MED ORDER — HYDROCODONE-ACETAMINOPHEN 7.5-325 MG PO TABS: 1.0000 | ORAL_TABLET | ORAL | Status: DC | PRN

## 2014-09-05 MED ORDER — METHOCARBAMOL 500 MG PO TABS: 500.0000 mg | ORAL_TABLET | Freq: Four times a day (QID) | ORAL | Status: DC | PRN

## 2014-09-05 MED ORDER — CEFADROXIL 500 MG PO CAPS
500.0000 mg | ORAL_CAPSULE | Freq: Two times a day (BID) | ORAL | Status: DC
Start: 2014-09-05 — End: 2014-09-05
  Filled 2014-09-05 (×2): qty 1

## 2014-09-05 MED ORDER — HYDROCODONE-ACETAMINOPHEN 5-325 MG PO TABS: 1.0000 | ORAL_TABLET | Freq: Four times a day (QID) | ORAL | Status: DC | PRN

## 2014-09-05 NOTE — Op Note (Signed)
NAMMichel Bickers:  Kindt, Maily                 ACCOUNT NO.:  [id number]  MEDICAL RECORD NO.:  [id number]  LOCATION:  [id number]                        FACILITY:  [facility]  PHYSICIAN:  Samson FredericBrian Romone Shaff, MD     DATE OF BIRTH:  [birth year]  DATE OF PROCEDURE:  09/04/2014 DATE OF DISCHARGE:                              OPERATIVE REPORT   SURGEON:  Samson FredericBrian Chilton Sallade, MD  ASSISTANT:  Hart CarwinJustin Queen, RNFA.  PREOPERATIVE DIAGNOSIS:  Stitch abscess, left hip.  POSTOPERATIVE DIAGNOSIS:  Stitch abscess, left hip.  PROCEDURE PERFORMED:  Debridement of skin and subcutaneous tissue with primary wound closure.  IMPLANTS:  None.  TUBES AND DRAINS:  Medium Hemovac superficially x1.  ANTIBIOTICS:  2 g of Ancef.  SPECIMENS:  Left hip synovial fluid for cell count with differential and culture.  COMPLICATIONS:  None.  DISPOSITION:  Stable to PACU.  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  The patient was doing well until about 2 days ago.  She placed a new type of waterproof dressing over her incision and when she removed it, she noticed a contact dermatitis over the area of the dressing.  Additionally, she had a superficial wound dehiscence of the distal cm and a half of her incision.  She came to the office and we discussed debridement of the wound with closure.  Risks, benefits, and alternatives were explained.  She elected to proceed.  DESCRIPTION OF PROCEDURE IN DETAIL:  The patient was correctly identified in the preop holding area using 2 identifiers.  The surgical site was marked by myself.  She was taken to the operating room, placed supine on the Hana table.  General anesthesia was induced and then the left hip was prepped and draped in normal sterile surgical fashion. Time-out was called verifying site and site of surgery.  Preop antibiotics were given within 60 minutes of  beginning the procedure.  I began by examining her left hip.  She had a superficial dehiscence of approximately the last cm and a half of her wound, and then there was a slightly smaller place just proximal to this.  I used a 15 blade and I excisionally debrided the skin edges of this area.  I extended her incision up using about 2/3rd of her entire incision.  The fat underneath this area was intact.  I used blunt digital dissection to go down to the level of the fascia.  The fascia was intact.  I used a rongeur to debride a small amount of devitalized fatty tissue.  There were no fluid collections or pockets of purulence.  I irrigated the subcutaneous tissues with approximately 3 L of saline.  I then took an 18-gauge spinal needle and through the fascia, I inserted it into the hip joint.  I aspirated about 15 mL of bloody tinged synovial fluid. There was no cloudiness or chunks of material within the joint fluid.  I sent the joint fluid for cell count with differential and culture.  I then closed her wound in a layered flat fashion with 2-0 Monocryl for the deep fatty layer as  well as the deep dermal layer, followed by 2-0 interrupted nylon sutures for the skin using a vertical mattress technique.  The wound was closed with a medium Hemovac drain, sitting directly on top of the fascia.  Sterile dressing was applied.  There were no complications.  She tolerated the procedure well.  She was extubated and sent to the PACU.  Sponge, needle, and instrument counts were correct at the end of the case x2.  I discussed operative events and findings with the patient's family.  I do not expect that she has a deep infection.  I will watch her overnight, so I can have a look at her wound in the morning.  I will take her Hemovac out in the morning.  We will give her IV Ancef overnight.  I will discharge her on a 7-10 day course of Duricef.  I will see her in the office in 2 weeks for suture removal.   All questions were solicited and answered to her satisfaction.          ______________________________ Samson Frederic, MD     BS/MEDQ  D:  09/04/2014  T:  09/05/2014  Job:  [id number]

## 2014-09-05 NOTE — Discharge Summary (Signed)
Physician Discharge Summary   Patient ID: Cassidy Green MRN: [id number] DOB/AGE: 09-22-1955 59 y.o.  Admit date: 09/04/2014 Discharge date: 09-05-2014  Primary Diagnosis:  Stitch abscess, left hip. Admission Diagnoses:  Past Medical History  Diagnosis Date  . Hypertension   . Hyperlipidemia   . Arthritis   . GERD (gastroesophageal reflux disease)   . Anemia   . Diabetes mellitus without complication   . Anxiety    Discharge Diagnoses:   Principal Problem:   Postoperative stitch abscess  Estimated body mass index is 24.69 kg/(m^2) as calculated from the following:   Height as of this encounter: $RemoveBeforeD'5\' 2"'[id number]$  (1.575 m).   Weight as of this encounter: 61.236 kg (135 lb).  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 The patient was doing well until about 2 days ago. She placed a new type of waterproof dressing over her incision and when she removed it, she noticed a contact dermatitis over the area of the dressing. Additionally, she had a superficial wound dehiscence of the distal cm and a half of her incision. She came to the office and we discussed debridement of the wound with closure. Risks, benefits, and alternatives were explained. She elected to proceed. Laboratory Data: Admission on 09/04/2014, Discharged on 09/05/2014  Component Date Value Ref Range Status  . WBC 09/04/2014 6.7  4.0 - 10.5 K/uL Final  . RBC 09/04/2014 3.20* 3.87 - 5.11 MIL/uL Final  . Hemoglobin 09/04/2014 8.6* 12.0 - 15.0 g/dL Final  . HCT 09/04/2014 28.2* 36.0 - 46.0 % Final  . MCV 09/04/2014 88.1  78.0 - 100.0 fL Final  . MCH 09/04/2014 26.9  26.0 - 34.0 pg Final  . MCHC 09/04/2014 30.5  30.0 - 36.0 g/dL Final  . RDW 09/04/2014 16.3* 11.5 - 15.5 % Final  . Platelets 09/04/2014 281   150 - 400 K/uL Final  . Sodium 09/04/2014 144  135 - 145 mmol/L Final  . Potassium 09/04/2014 4.6  3.5 - 5.1 mmol/L Final  . Chloride 09/04/2014 117* 96 - 112 mmol/L Final  . CO2 09/04/2014 18* 19 - 32 mmol/L Final  . Glucose, Bld 09/04/2014 82  70 - 99 mg/dL Final  . BUN 09/04/2014 23  6 - 23 mg/dL Final  . Creatinine, Ser 09/04/2014 1.55* 0.50 - 1.10 mg/dL Final  . Calcium 09/04/2014 9.3  8.4 - 10.5 mg/dL Final  . GFR calc non Af Amer 09/04/2014 36* >90 mL/min Final  . GFR calc Af Amer 09/04/2014 41* >90 mL/min Final   Comment: (NOTE) The eGFR has been calculated using the CKD EPI equation. This calculation has not been validated in all clinical situations. eGFR's persistently <90 mL/min signify possible Chronic Kidney Disease.   . Anion gap 09/04/2014 9  5 - 15 Final  . Glucose-Capillary 09/04/2014 89  70 - 99 mg/dL Final  . Glucose-Capillary 09/04/2014 82  70 - 99 mg/dL Final  . Specimen Description 09/04/2014 TISSUE HIP LEFT   Final  . Special Requests 09/04/2014 NONE   Final  . Gram Stain 09/04/2014    Final                   Value:RARE WBC PRESENT,BOTH PMN AND MONONUCLEAR NO ORGANISMS SEEN Performed at [facility]   . Culture 09/04/2014  Final                   Value:NO ANAEROBES ISOLATED Performed at [facility]   . Report Status 09/04/2014 09/09/2014 FINAL   Final  . Specimen Description 09/04/2014 TISSUE HIP LEFT   Final  . Special Requests 09/04/2014 NONE   Final  . Gram Stain 09/04/2014    Final                   Value:RARE WBC PRESENT,BOTH PMN AND MONONUCLEAR NO ORGANISMS SEEN Performed at [facility]   . Culture 09/04/2014    Final                   Value:NO GROWTH 3 DAYS Performed at [facility]   . Report Status 09/04/2014 09/08/2014 FINAL   Final  . ABO/RH(D) 09/04/2014 B POS   Final  . Antibody Screen 09/04/2014 NEG   Final  . Sample Expiration 09/04/2014 09/07/2014   Final  . Unit Number 09/04/2014 [id number]    Final  . Blood Component Type 09/04/2014 RED CELLS,LR   Final  . Unit division 09/04/2014 00   Final  . Status of Unit 09/04/2014 REL FROM [facility]   Final  . Transfusion Status 09/04/2014 OK TO TRANSFUSE   Final  . Crossmatch Result 09/04/2014 Compatible   Final  . Unit Number 09/04/2014 [id number]   Final  . Blood Component Type 09/04/2014 RED CELLS,LR   Final  . Unit division 09/04/2014 00   Final  . Status of Unit 09/04/2014 REL FROM [facility]   Final  . Transfusion Status 09/04/2014 OK TO TRANSFUSE   Final  . Crossmatch Result 09/04/2014 Compatible   Final  . Order Confirmation 09/04/2014 ORDER PROCESSED BY BLOOD BANK   Final  . ABO/RH(D) 09/04/2014 B POS   Final  . Color, Synovial 09/04/2014 AMBER* YELLOW Final  . Appearance-Synovial 09/04/2014 TURBID* CLEAR Final  . Crystals, Fluid 09/04/2014 NO CRYSTALS SEEN   Final  . WBC, Synovial 09/04/2014 788* 0 - 200 /cu mm Final  . Neutrophil, Synovial 09/04/2014 14  0 - 25 % Final  . Lymphocytes-Synovial Fld 09/04/2014 44* 0 - 20 % Final  . Monocyte-Macrophage-Synovial Fluid 09/04/2014 36* 50 - 90 % Final  . Eosinophils-Synovial 09/04/2014 6* 0 - 1 % Final  . Glucose-Capillary 09/04/2014 76  70 - 99 mg/dL Final  . Glucose-Capillary 09/04/2014 129* 70 - 99 mg/dL Final  . Comment 1 09/04/2014 Notify RN   Final  . Glucose-Capillary 09/05/2014 121* 70 - 99 mg/dL Final  . Comment 1 09/05/2014 Notify RN   Final  Admission on 07/30/2014, Discharged on 08/01/2014  Component Date Value Ref Range Status  . ABO/RH(D) 07/30/2014 B POS   Final  . Antibody Screen 07/30/2014 NEG   Final  . Sample Expiration 07/30/2014 08/02/2014   Final  . Glucose-Capillary 07/30/2014 85  70 - 99 mg/dL Final  . ABO/RH(D) 07/30/2014 B POS   Final  . Glucose-Capillary 07/30/2014 180* 70 - 99 mg/dL Final  . WBC 07/31/2014 10.6* 4.0 - 10.5 K/uL Final  . RBC 07/31/2014 2.94* 3.87 - 5.11 MIL/uL Final  . Hemoglobin 07/31/2014 8.2* 12.0 - 15.0 g/dL Final  . HCT 07/31/2014  25.2* 36.0 - 46.0 % Final  . MCV 07/31/2014 85.7  78.0 - 100.0 fL Final  . MCH 07/31/2014 27.9  26.0 - 34.0 pg Final  . MCHC 07/31/2014 32.5  30.0 - 36.0 g/dL Final  . RDW 07/31/2014  15.8* 11.5 - 15.5 % Final  . Platelets 07/31/2014 188  150 - 400 K/uL Final  . Sodium 07/31/2014 138  135 - 145 mmol/L Final  . Potassium 07/31/2014 4.3  3.5 - 5.1 mmol/L Final  . Chloride 07/31/2014 108  96 - 112 mmol/L Final  . CO2 07/31/2014 22  19 - 32 mmol/L Final  . Glucose, Bld 07/31/2014 147* 70 - 99 mg/dL Final  . BUN 07/31/2014 20  6 - 23 mg/dL Final  . Creatinine, Ser 07/31/2014 1.16* 0.50 - 1.10 mg/dL Final  . Calcium 07/31/2014 8.6  8.4 - 10.5 mg/dL Final  . GFR calc non Af Amer 07/31/2014 50* >90 mL/min Final  . GFR calc Af Amer 07/31/2014 59* >90 mL/min Final   Comment: (NOTE) The eGFR has been calculated using the CKD EPI equation. This calculation has not been validated in all clinical situations. eGFR's persistently <90 mL/min signify possible Chronic Kidney Disease.   . Anion gap 07/31/2014 8  5 - 15 Final  . Glucose-Capillary 07/31/2014 151* 70 - 99 mg/dL Final  . Glucose-Capillary 07/31/2014 138* 70 - 99 mg/dL Final  . WBC 08/01/2014 8.2  4.0 - 10.5 K/uL Final  . RBC 08/01/2014 2.58* 3.87 - 5.11 MIL/uL Final  . Hemoglobin 08/01/2014 7.1* 12.0 - 15.0 g/dL Final  . HCT 08/01/2014 22.2* 36.0 - 46.0 % Final  . MCV 08/01/2014 86.0  78.0 - 100.0 fL Final  . MCH 08/01/2014 27.5  26.0 - 34.0 pg Final  . MCHC 08/01/2014 32.0  30.0 - 36.0 g/dL Final  . RDW 08/01/2014 16.1* 11.5 - 15.5 % Final  . Platelets 08/01/2014 160  150 - 400 K/uL Final  . Glucose-Capillary 07/31/2014 149* 70 - 99 mg/dL Final  . Glucose-Capillary 07/31/2014 179* 70 - 99 mg/dL Final  . Glucose-Capillary 08/01/2014 98  70 - 99 mg/dL Final  Hospital Outpatient Visit on 07/21/2014  Component Date Value Ref Range Status  . MRSA, PCR 07/21/2014 NEGATIVE  NEGATIVE Final  . Staphylococcus aureus 07/21/2014 NEGATIVE   NEGATIVE Final   Comment:        The Xpert SA Assay (FDA approved for NASAL specimens in patients over 71 years of age), is one component of a comprehensive surveillance program.  Test performance has been validated by [facility] for patients greater than or equal to 54 year old. It is not intended to diagnose infection nor to guide or monitor treatment.   Marland Kitchen aPTT 07/21/2014 32  24 - 37 seconds Final  . WBC 07/21/2014 8.2  4.0 - 10.5 K/uL Final  . RBC 07/21/2014 4.01  3.87 - 5.11 MIL/uL Final  . Hemoglobin 07/21/2014 10.9* 12.0 - 15.0 g/dL Final  . HCT 07/21/2014 35.5* 36.0 - 46.0 % Final  . MCV 07/21/2014 88.5  78.0 - 100.0 fL Final  . MCH 07/21/2014 27.2  26.0 - 34.0 pg Final  . MCHC 07/21/2014 30.7  30.0 - 36.0 g/dL Final  . RDW 07/21/2014 15.8* 11.5 - 15.5 % Final  . Platelets 07/21/2014 254  150 - 400 K/uL Final  . Sodium 07/21/2014 141  135 - 145 mmol/L Final  . Potassium 07/21/2014 4.6  3.5 - 5.1 mmol/L Final  . Chloride 07/21/2014 117* 96 - 112 mmol/L Final  . CO2 07/21/2014 19  19 - 32 mmol/L Final  . Glucose, Bld 07/21/2014 89  70 - 99 mg/dL Final  . BUN 07/21/2014 28* 6 - 23 mg/dL Final  . Creatinine, Ser 07/21/2014 1.78* 0.50 - 1.10 mg/dL  Final  . Calcium 07/21/2014 9.5  8.4 - 10.5 mg/dL Final  . Total Protein 07/21/2014 8.2  6.0 - 8.3 g/dL Final  . Albumin 07/21/2014 4.4  3.5 - 5.2 g/dL Final  . AST 07/21/2014 27  0 - 37 U/L Final  . ALT 07/21/2014 12  0 - 35 U/L Final  . Alkaline Phosphatase 07/21/2014 75  39 - 117 U/L Final  . Total Bilirubin 07/21/2014 0.3  0.3 - 1.2 mg/dL Final  . GFR calc non Af Amer 07/21/2014 30* >90 mL/min Final  . GFR calc Af Amer 07/21/2014 35* >90 mL/min Final   Comment: (NOTE) The eGFR has been calculated using the CKD EPI equation. This calculation has not been validated in all clinical situations. eGFR's persistently <90 mL/min signify possible Chronic Kidney Disease.   . Anion gap 07/21/2014 5  5 - 15 Final  . Prothrombin  Time 07/21/2014 13.8  11.6 - 15.2 seconds Final  . INR 07/21/2014 1.05  0.00 - 1.49 Final  . Color, Urine 07/21/2014 YELLOW  YELLOW Final  . APPearance 07/21/2014 CLOUDY* CLEAR Final  . Specific Gravity, Urine 07/21/2014 1.026  1.005 - 1.030 Final  . pH 07/21/2014 5.5  5.0 - 8.0 Final  . Glucose, UA 07/21/2014 NEGATIVE  NEGATIVE mg/dL Final  . Hgb urine dipstick 07/21/2014 NEGATIVE  NEGATIVE Final  . Bilirubin Urine 07/21/2014 NEGATIVE  NEGATIVE Final  . Ketones, ur 07/21/2014 NEGATIVE  NEGATIVE mg/dL Final  . Protein, ur 07/21/2014 30* NEGATIVE mg/dL Final  . Urobilinogen, UA 07/21/2014 0.2  0.0 - 1.0 mg/dL Final  . Nitrite 07/21/2014 NEGATIVE  NEGATIVE Final  . Leukocytes, UA 07/21/2014 NEGATIVE  NEGATIVE Final  . Squamous Epithelial / LPF 07/21/2014 FEW* RARE Final  . WBC, UA 07/21/2014 3-6  <3 WBC/hpf Final  . Bacteria, UA 07/21/2014 FEW* RARE Final  . Casts 07/21/2014 WBC CAST* NEGATIVE Final     X-Rays:No results found.  EKG: Orders placed or performed in visit on 07/21/14  . EKG 12-Lead     Hospital Course: Patient was admitted to [facility] and taken to the OR and underwent the above state procedure without complications.  Patient tolerated the procedure well and was later transferred to the recovery room and then to the orthopaedic floor for postoperative care.  They were given PO and IV analgesics for pain control following their surgery.  They were given 24 hours of postoperative antibiotics of  Anti-infectives    Start     Dose/Rate Route Frequency Ordered Stop   09/05/14 1030  cefadroxil (DURICEF) capsule 500 mg  Status:  Discontinued     500 mg Oral Every 12 hours 09/05/14 0912 09/05/14 1416   09/05/14 0000  cefadroxil (DURICEF) 500 MG capsule  Status:  Discontinued     500 mg Oral 2 times daily 09/05/14 0913 09/05/14    09/05/14 0000  cefadroxil (DURICEF) 500 MG capsule     500 mg Oral 2 times daily 09/05/14 0948     09/04/14 2200  ceFAZolin (ANCEF) IVPB 1  g/50 mL premix     1 g 100 mL/hr over 30 Minutes Intravenous Every 6 hours 09/04/14 2057 09/05/14 0936   09/04/14 1615  ceFAZolin (ANCEF) IVPB 2 g/50 mL premix     2 g 100 mL/hr over 30 Minutes Intravenous  Once 09/04/14 1608 09/04/14 1615     and started on DVT prophylaxis in the form of Aspirin.   The patient was allowed to be WBAT with therapy. Discharge  planning was consulted to help with postop disposition and equipment needs.  Patient had a decnt night on the evening of surgery.  They started to get up OOB with therapy on day one.  Patient was seen in rounds and was ready to go home later that same day.   Diet: Cardiac diet Activity:WBAT Follow-up:in 2 weeks Disposition - Home Discharged Condition: good       Discharge Instructions    Call MD / Call 911    Complete by:  As directed   If you experience chest pain or shortness of breath, CALL 911 and be transported to the hospital emergency room.  If you develope a fever above 101 F, pus (white drainage) or increased drainage or redness at the wound, or calf pain, call your surgeon's office.     Call MD / Call 911    Complete by:  As directed   If you experience chest pain or shortness of breath, CALL 911 and be transported to the hospital emergency room.  If you develope a fever above 101 F, pus (white drainage) or increased drainage or redness at the wound, or calf pain, call your surgeon's office.     Change dressing    Complete by:  As directed   You may change your dressing dressing daily with sterile 4 x 4 inch gauze dressing and paper tape.  Do not submerge the incision under water.     Constipation Prevention    Complete by:  As directed   Drink plenty of fluids.  Prune juice may be helpful.  You may use a stool softener, such as Colace (over the counter) 100 mg twice a day.  Use MiraLax (over the counter) for constipation as needed.     Constipation Prevention    Complete by:  As directed   Drink plenty of fluids.  Prune  juice may be helpful.  You may use a stool softener, such as Colace (over the counter) 100 mg twice a day.  Use MiraLax (over the counter) for constipation as needed.     Diet - low sodium heart healthy    Complete by:  As directed      Diet - low sodium heart healthy    Complete by:  As directed      Discharge instructions    Complete by:  As directed   Pick up stool softner and laxative for home use following surgery while on pain medications. Do not submerge incision under water. Please use good hand washing techniques while changing dressing each day. May shower starting three days after surgery. Please use a clean towel to pat the incision dry following showers. Continue to use ice for pain and swelling after surgery. Do not use any lotions or creams on the incision until instructed by your surgeon. Hip precautions.  Total Hip Protocol.  Postoperative Constipation Protocol  Constipation - defined medically as fewer than three stools per week and severe constipation as less than one stool per week.  One of the most common issues patients have following surgery is constipation.  Even if you have a regular bowel pattern at home, your normal regimen is likely to be disrupted due to multiple reasons following surgery.  Combination of anesthesia, postoperative narcotics, change in appetite and fluid intake all can affect your bowels.  In order to avoid complications following surgery, here are some recommendations in order to help you during your recovery period.  Colace (docusate) - Pick up an over-the-counter form  of Colace or another stool softener and take twice a day as long as you are requiring postoperative pain medications.  Take with a full glass of water daily.  If you experience loose stools or diarrhea, hold the colace until you stool forms back up.  If your symptoms do not get better within 1 week or if they get worse, check with your doctor.  Dulcolax (bisacodyl) - Pick up  over-the-counter and take as directed by the product packaging as needed to assist with the movement of your bowels.  Take with a full glass of water.  Use this product as needed if not relieved by Colace only.   MiraLax (polyethylene glycol) - Pick up over-the-counter to have on hand.  MiraLax is a solution that will increase the amount of water in your bowels to assist with bowel movements.  Take as directed and can mix with a glass of water, juice, soda, coffee, or tea.  Take if you go more than two days without a movement. Do not use MiraLax more than once per day. Call your doctor if you are still constipated or irregular after using this medication for 7 days in a row.  If you continue to have problems with postoperative constipation, please contact the office for further assistance and recommendations.  If you experience "the worst abdominal pain ever" or develop nausea or vomiting, please contact the office immediatly for further recommendations for treatment.     Do not sit on low chairs, stoools or toilet seats, as it may be difficult to get up from low surfaces    Complete by:  As directed      Driving restrictions    Complete by:  As directed   No driving until released by the physician.     Follow the hip precautions as taught in Physical Therapy    Complete by:  As directed      Increase activity slowly as tolerated    Complete by:  As directed      Increase activity slowly as tolerated    Complete by:  As directed      Lifting restrictions    Complete by:  As directed   No lifting until released by the physician.     Patient may shower    Complete by:  As directed   You may shower without a dressing once there is no drainage.  Do not wash over the wound.  If drainage remains, do not shower until drainage stops.     TED hose    Complete by:  As directed   Use stockings (TED hose) for 3 weeks on both leg(s).  You may remove them at night for sleeping.     Weight bearing as  tolerated    Complete by:  As directed   Laterality:  left  Extremity:  Lower            Medication List    STOP taking these medications        amoxicillin 500 MG capsule  Commonly known as:  AMOXIL     BC HEADACHE POWDER PO     HYDROcodone-acetaminophen 7.5-325 MG per tablet  Commonly known as:  NORCO  Replaced by:  HYDROcodone-acetaminophen 5-325 MG per tablet     meloxicam 15 MG tablet  Commonly known as:  MOBIC      TAKE these medications        ALPRAZolam 0.25 MG tablet  Commonly known as:  XANAX  Take  0.25 mg by mouth daily as needed for anxiety.     aspirin EC 325 MG tablet  Take 1 tablet (325 mg total) by mouth 2 (two) times daily after a meal.     cefadroxil 500 MG capsule  Commonly known as:  DURICEF  Take 1 capsule (500 mg total) by mouth 2 (two) times daily.     docusate sodium 100 MG capsule  Commonly known as:  COLACE  Take 1 capsule (100 mg total) by mouth 2 (two) times daily.     ezetimibe 10 MG tablet  Commonly known as:  ZETIA  Take 10 mg by mouth at bedtime.     folic acid 1 MG tablet  Commonly known as:  FOLVITE  Take 1 mg by mouth at bedtime.     HEMOCYTE PO  Take 1 tablet by mouth 2 (two) times daily.     HYDROcodone-acetaminophen 5-325 MG per tablet  Commonly known as:  NORCO  Take 1-2 tablets by mouth every 6 (six) hours as needed for moderate pain.     lisinopril 20 MG tablet  Commonly known as:  PRINIVIL,ZESTRIL  Take 20 mg by mouth at bedtime.     methocarbamol 500 MG tablet  Commonly known as:  ROBAXIN  Take 1 tablet (500 mg total) by mouth every 6 (six) hours as needed for muscle spasms.     omeprazole 40 MG capsule  Commonly known as:  PRILOSEC  Take 40 mg by mouth at bedtime.     ondansetron 4 MG tablet  Commonly known as:  ZOFRAN  Take 1 tablet (4 mg total) by mouth every 6 (six) hours as needed for nausea or vomiting.     sitaGLIPtin-metformin 50-1000 MG per tablet  Commonly known as:  JANUMET  Take 1 tablet  by mouth at bedtime.     sodium chloride 0.65 % Soln nasal spray  Commonly known as:  OCEAN  Place 1 spray into both nostrils at bedtime.     Vitamin D 2000 UNITS Caps  Take 1 capsule by mouth at bedtime.       Follow-up Information    Follow up with Swinteck, Horald Pollen, MD. Schedule an appointment as soon as possible for a visit in 2 weeks.   Specialty:  Orthopedic Surgery   Why:  For wound re-check   Contact information:   [address] [address] [phone]       Signed: Arlee Muslim, PA-C Orthopaedic Surgery 09/10/2014, 8:39 AM

## 2014-09-05 NOTE — Progress Notes (Signed)
   Subjective: 1 Day Post-Op Procedure(s) (LRB): IRRIGATION AND DEBRIDEMENT  LEFT HIP WOUND WITH CLOSURE  (Left) Patient reports pain as mild.   Patient seen in rounds with Dr. Lequita HaltAluisio.  Sitting up in chair this morning on rounds. Patient is well, but has had some minor complaints of pain in the hip, requiring pain medications We will start therapy today.  Plan is to go Home after hospital stay.  Objective: Vital signs in last 24 hours: Temp:  [97.6 F (36.4 C)-97.9 F (36.6 C)] 97.6 F (36.4 C) (04/09 0456) Pulse Rate:  [67-100] 67 (04/09 0456) Resp:  [3-24] 14 (04/09 0456) BP: (107-152)/(65-80) 131/75 mmHg (04/09 0456) SpO2:  [91 %-100 %] 100 % (04/09 0456) Weight:  [61.236 kg (135 lb)] 61.236 kg (135 lb) (04/08 1303)  Intake/Output from previous day:  Intake/Output Summary (Last 24 hours) at 09/05/14 0900 Last data filed at 09/05/14 0523  Gross per 24 hour  Intake 3161.25 ml  Output    250 ml  Net 2911.25 ml     Labs:  Recent Labs  09/04/14 1430  HGB 8.6*    Recent Labs  09/04/14 1430  WBC 6.7  RBC 3.20*  HCT 28.2*  PLT 281    Recent Labs  09/04/14 1430  NA 144  K 4.6  CL 117*  CO2 18*  BUN 23  CREATININE 1.55*  GLUCOSE 82  CALCIUM 9.3   No results for input(s): LABPT, INR in the last 72 hours.  EXAM General - Patient is Alert and Appropriate Extremity - Neurovascular intact Sensation intact distally Dressing - dressing C/D/I Motor Function - intact, moving foot and toes well on exam.  Hemovac pulled without difficulty.  Past Medical History  Diagnosis Date  . Hypertension   . Hyperlipidemia   . Arthritis   . GERD (gastroesophageal reflux disease)   . Anemia   . Diabetes mellitus without complication   . Anxiety     Assessment/Plan: 1 Day Post-Op Procedure(s) (LRB): IRRIGATION AND DEBRIDEMENT  LEFT HIP WOUND WITH CLOSURE  (Left) Principal Problem:   Postoperative stitch abscess  Estimated body mass index is 24.69 kg/(m^2) as  calculated from the following:   Height as of this encounter: 5\' 2"  (1.575 m).   Weight as of this encounter: 61.236 kg (135 lb). Up with therapy Discharge home with home health  DVT Prophylaxis - Aspirin Weight Bearing As Tolerated left Leg Hemovac Pulled Begin Therapy Hip Preacutions DC home  Cassidy Peacerew Ardean Simonich, PA-C Orthopaedic Surgery 09/05/2014, 9:00 AM

## 2014-09-05 NOTE — Discharge Instructions (Signed)
Do not remove Aquacel dressing You may shower beginning 4 days after discharge Complete your oral antibiotics

## 2014-09-05 NOTE — Progress Notes (Signed)
UR completed 

## 2014-09-07 ENCOUNTER — Encounter (HOSPITAL_COMMUNITY): Payer: Self-pay | Admitting: Orthopedic Surgery

## 2014-09-07 NOTE — H&P (Signed)
ORTHOPAEDIC H&P   PCP:  Galvin ProfferHAGUE, IMRAN P, MD  Chief Complaint: stitch abscess, left hip  HPI: Cassidy Green is a 59 y.o. female who complains of  Stitch abscess to left hip s/p L THA 4 weeks ago.  Past Medical History  Diagnosis Date  . Hypertension   . Hyperlipidemia   . Arthritis   . GERD (gastroesophageal reflux disease)   . Anemia   . Diabetes mellitus without complication   . Anxiety    Past Surgical History  Procedure Laterality Date  . Abdominal hysterectomy  1992  . Tubal ligation    . Total hip arthroplasty Left 07/30/2014    Procedure: LEFT TOTAL HIP ARTHROPLASTY ANTERIOR APPROACH;  Surgeon: Garnet KoyanagiBrian James Wylodean Shimmel, MD;  Location: [facility];  Service: Orthopedics;  Laterality: Left;   History   Social History  . Marital Status: Married    Spouse Name: N/A  . Number of Children: N/A  . Years of Education: N/A   Social History Main Topics  . Smoking status: Former Smoker    Quit date: 07/21/2001  . Smokeless tobacco: Not on file  . Alcohol Use: Yes     Comment: OCCASIONAL  . Drug Use: No  . Sexual Activity: Not on file   Other Topics Concern  . None   Social History Narrative   History reviewed. No pertinent family history. No Known Allergies Prior to Admission medications   Medication Sig Start Date End Date Taking? Authorizing Provider  ALPRAZolam Prudy Feeler(XANAX) 0.25 MG tablet Take 0.25 mg by mouth daily as needed for anxiety.   Yes Historical Provider, MD  aspirin EC 325 MG tablet Take 1 tablet (325 mg total) by mouth 2 (two) times daily after a meal. 07/31/14  Yes Samson FredericBrian Sander Speckman, MD  Cholecalciferol (VITAMIN D) 2000 UNITS CAPS Take 1 capsule by mouth at bedtime.   Yes Historical Provider, MD  docusate sodium (COLACE) 100 MG capsule Take 1 capsule (100 mg total) by mouth 2 (two) times daily. 07/31/14  Yes Samson FredericBrian Nioka Thorington, MD  ezetimibe (ZETIA) 10 MG tablet Take 10 mg by mouth at bedtime.   Yes Historical Provider, MD  Ferrous Fumarate (HEMOCYTE PO) Take 1 tablet by  mouth 2 (two) times daily.   Yes Historical Provider, MD  folic acid (FOLVITE) 1 MG tablet Take 1 mg by mouth at bedtime.   Yes Historical Provider, MD  lisinopril (PRINIVIL,ZESTRIL) 20 MG tablet Take 20 mg by mouth at bedtime.   Yes Historical Provider, MD  omeprazole (PRILOSEC) 40 MG capsule Take 40 mg by mouth at bedtime.   Yes Historical Provider, MD  ondansetron (ZOFRAN) 4 MG tablet Take 1 tablet (4 mg total) by mouth every 6 (six) hours as needed for nausea or vomiting. 07/31/14  Yes Samson FredericBrian Okla Qazi, MD  sitaGLIPtin-metformin (JANUMET) 50-1000 MG per tablet Take 1 tablet by mouth at bedtime.   Yes Historical Provider, MD  sodium chloride (OCEAN) 0.65 % SOLN nasal spray Place 1 spray into both nostrils at bedtime.   Yes Historical Provider, MD  cefadroxil (DURICEF) 500 MG capsule Take 1 capsule (500 mg total) by mouth 2 (two) times daily. 09/05/14   Samson FredericBrian Danil Wedge, MD  HYDROcodone-acetaminophen (NORCO) 5-325 MG per tablet Take 1-2 tablets by mouth every 6 (six) hours as needed for moderate pain. 09/05/14   Samson FredericBrian Monike Bragdon, MD  methocarbamol (ROBAXIN) 500 MG tablet Take 1 tablet (500 mg total) by mouth every 6 (six) hours as needed for muscle spasms. 09/05/14   Avel Peacerew Perkins, PA-C  No results found.  Positive ROS: All other systems have been reviewed and were otherwise negative with the exception of those mentioned in the HPI and as above.  Physical Exam: General: Alert, no acute distress Cardiovascular: No pedal edema Respiratory: No cyanosis, no use of accessory musculature GI: No organomegaly, abdomen is soft and non-tender Skin: No lesions in the area of chief complaint Neurologic: Sensation intact distally Psychiatric: Patient is competent for consent with normal mood and affect Lymphatic: No axillary or cervical lymphadenopathy  MUSCULOSKELETAL: superficial wound dehiscence. No erythema.  Assessment: Stitch abscess, left hip  Plan: To OR for L hip I&D, wound closure, aspiration of  hip    Garnet Koyanagi, MD Cell [phone]    09/07/2014 11:26 AM

## 2014-09-08 LAB — TISSUE CULTURE: Culture: NO GROWTH

## 2014-09-08 LAB — TYPE AND SCREEN
ABO/RH(D): B POS
Antibody Screen: NEGATIVE
UNIT DIVISION: 0
UNIT DIVISION: 0

## 2014-09-09 LAB — ANAEROBIC CULTURE

## 2014-10-28 ENCOUNTER — Encounter (HOSPITAL_BASED_OUTPATIENT_CLINIC_OR_DEPARTMENT_OTHER): Payer: BLUE CROSS/BLUE SHIELD | Attending: Surgery

## 2015-03-04 ENCOUNTER — Encounter (HOSPITAL_COMMUNITY): Payer: Self-pay

## 2015-03-04 ENCOUNTER — Encounter (HOSPITAL_COMMUNITY)
Admission: RE | Admit: 2015-03-04 | Discharge: 2015-03-04 | Disposition: A | Discharge status: Home / Self Care | Payer: BLUE CROSS/BLUE SHIELD | Source: Ambulatory Visit | Attending: Orthopedic Surgery | Admitting: Orthopedic Surgery

## 2015-03-04 DIAGNOSIS — E119 Type 2 diabetes mellitus without complications: Secondary | ICD-10-CM | POA: Insufficient documentation

## 2015-03-04 DIAGNOSIS — Z7984 Long term (current) use of oral hypoglycemic drugs: Secondary | ICD-10-CM | POA: Diagnosis not present

## 2015-03-04 DIAGNOSIS — E785 Hyperlipidemia, unspecified: Secondary | ICD-10-CM | POA: Insufficient documentation

## 2015-03-04 DIAGNOSIS — M7138 Other bursal cyst, other site: Secondary | ICD-10-CM | POA: Diagnosis not present

## 2015-03-04 DIAGNOSIS — I10 Essential (primary) hypertension: Secondary | ICD-10-CM | POA: Insufficient documentation

## 2015-03-04 DIAGNOSIS — Z01812 Encounter for preprocedural laboratory examination: Secondary | ICD-10-CM | POA: Diagnosis not present

## 2015-03-04 DIAGNOSIS — K219 Gastro-esophageal reflux disease without esophagitis: Secondary | ICD-10-CM | POA: Insufficient documentation

## 2015-03-04 DIAGNOSIS — Z01818 Encounter for other preprocedural examination: Secondary | ICD-10-CM | POA: Diagnosis present

## 2015-03-04 DIAGNOSIS — Z79899 Other long term (current) drug therapy: Secondary | ICD-10-CM | POA: Diagnosis not present

## 2015-03-04 DIAGNOSIS — Z87891 Personal history of nicotine dependence: Secondary | ICD-10-CM | POA: Insufficient documentation

## 2015-03-04 HISTORY — DX: Personal history of other diseases of the respiratory system: Z87.09

## 2015-03-04 HISTORY — DX: Headache, unspecified: R51.9

## 2015-03-04 HISTORY — DX: Cystocele, unspecified: N81.10

## 2015-03-04 HISTORY — DX: Unspecified osteoarthritis, unspecified site: M19.90

## 2015-03-04 HISTORY — DX: Personal history of pneumonia (recurrent): Z87.01

## 2015-03-04 HISTORY — DX: Headache: R51

## 2015-03-04 LAB — BASIC METABOLIC PANEL
Anion gap: 9 (ref 5–15)
BUN: 26 mg/dL — AB (ref 6–20)
CALCIUM: 9.7 mg/dL (ref 8.9–10.3)
CHLORIDE: 113 mmol/L — AB (ref 101–111)
CO2: 19 mmol/L — ABNORMAL LOW (ref 22–32)
CREATININE: 1.58 mg/dL — AB (ref 0.44–1.00)
GFR calc non Af Amer: 35 mL/min — ABNORMAL LOW (ref >60)
GFR, EST AFRICAN AMERICAN: 40 mL/min — AB (ref >60)
Glucose, Bld: 82 mg/dL (ref 65–99)
Potassium: 4.8 mmol/L (ref 3.5–5.1)
SODIUM: 141 mmol/L (ref 135–145)

## 2015-03-04 LAB — GLUCOSE, CAPILLARY: Glucose-Capillary: 80 mg/dL (ref 65–99)

## 2015-03-04 LAB — SURGICAL PCR SCREEN
MRSA, PCR: NEGATIVE
Staphylococcus aureus: NEGATIVE

## 2015-03-04 LAB — CBC
HCT: 34.7 % — ABNORMAL LOW (ref 36.0–46.0)
Hemoglobin: 10.7 g/dL — ABNORMAL LOW (ref 12.0–15.0)
MCH: 27.4 pg (ref 26.0–34.0)
MCHC: 30.8 g/dL (ref 30.0–36.0)
MCV: 88.7 fL (ref 78.0–100.0)
PLATELETS: 215 10*3/uL (ref 150–400)
RBC: 3.91 MIL/uL (ref 3.87–5.11)
RDW: 16.2 % — AB (ref 11.5–15.5)
WBC: 8 10*3/uL (ref 4.0–10.5)

## 2015-03-04 NOTE — Pre-Procedure Instructions (Signed)
Cassidy Green  03/04/2015      [facility] [phone] [facility], [place] - [address] [place] [address] Phone: [phone] Fax: [phone]  [facility] [id number] - [address], [place] - [address] AT [facility] [address] [place] [address] Phone: [phone] Fax: [phone]    Your procedure is scheduled on Thursday, October 13th, 2016.  Report to [facility] Admitting at 5:30 A.M.  Call this number if you have problems the morning of surgery:  [phone]   Remember:  Do not eat food or drink liquids after midnight.   Take these medicines the morning of surgery with A SIP OF WATER: Alprazolam (Xanax), Fluticasone (Flonase), Hydrocodone-acetaminophen (Norco).  Stop taking: Cetirizine-pseudoephedrine (Zyrtec-D), NSAIDS, Aspirin, Aleve, Naproxen, Ibuprofen, Advil, Motrin, Fish oil, all herbal medications, and all vitamins.    What do I do about my diabetes medications?   Do not take oral diabetes medicines (pills) the morning of surgery.      Do not wear jewelry, make-up or nail polish.  Do not wear lotions, powders, or perfumes.  You may NOT wear deodorant.  Do not shave 48 hours prior to surgery.    Do not bring valuables to the hospital.  [facility] is not responsible for any belongings or valuables.  Contacts, dentures or bridgework may not be worn into surgery.  Leave your suitcase in the car.  After surgery it may be brought to your room.  For patients admitted to the hospital, discharge time will be determined by your treatment team.  Patients discharged the day of surgery will not be allowed to drive home.   Special instructions:  See attached.   Please read over the following fact sheets that you were given. Pain Booklet, Coughing and Deep Breathing, MRSA Information and Surgical Site Infection Prevention    How to Manage Your Diabetes Before Surgery   Why is it important to  control my blood sugar before and after surgery?   Improving blood sugar levels before and after surgery helps healing and can limit problems.  A way of improving blood sugar control is eating a healthy diet by:  - Eating less sugar and carbohydrates  - Increasing activity/exercise  - Talk with your doctor about reaching your blood sugar goals  High blood sugars (greater than 180 mg/dL) can raise your risk of infections and slow down your recovery so you will need to focus on controlling your diabetes during the weeks before surgery.  Make sure that the doctor who takes care of your diabetes knows about your planned surgery including the date and location.  How do I manage my blood sugars before surgery?   Check your blood sugar at least 4 times a day, 2 days before surgery to make sure that they are not too high or low.   Check your blood sugar the morning of your surgery when you wake up and every 2               hours until you get to the Short-Stay unit.  If your blood sugar is less than 70 mg/dL, you will need to treat for low blood sugar by:  Treat a low blood sugar (less than 70 mg/dL) with 1/2 cup of clear juice (cranberry or apple), 4 glucose tablets, OR glucose gel.  Recheck blood sugar in 15 minutes after treatment (to make sure it is greater  than 70 mg/dL).  If blood sugar is not greater than 70 mg/dL on re-check, call [phone] for further instructions.   Report your blood sugar to the Short-Stay nurse when you get to Short-Stay.  References:  [facility], 2007 "How to Manage your Diabetes Before and After Surgery".

## 2015-03-04 NOTE — Progress Notes (Addendum)
PCP - Dr. Donnel Saxon Cardiologist - denies  EKG - 07/21/2014 - Epic CXR - denies  Echo - denies Stress test - requested  Cardiac Cath - denies  Patient denies shortness of breath and chest pain at PAT appointment.  Stress test requested from [facility] in [place].  Patient believes she had a stress test completed here at this location prior to hip surgery in March.    Patient states that she checks her blood sugar once daily.  Patient educated on importance of checking blood sugar 4 times a day prior to surgery and how to manage blood sugar if below 70 on day surgery.  Patient verbalized understanding.

## 2015-03-05 LAB — HEMOGLOBIN A1C
Hgb A1c MFr Bld: 6 % — ABNORMAL HIGH (ref 4.8–5.6)
MEAN PLASMA GLUCOSE: 126 mg/dL

## 2015-03-05 NOTE — Progress Notes (Signed)
Anesthesia Chart Review:  Pt is 59 year old female scheduled for L4-5 decompression and removal of synovial cyst on 03/11/2015 with Dr. Shon Baton.   PMH includes: HTN, DM, hyperlipidemia, anemia, GERD. Former smoker. BMI 25. S/p L THA 07/30/14. S/p L hip wound irrigation & debreidement 09/04/14.   Medications include: zetia, iron, lisinopril, prilosec, sitagliptin-metformin.   Preoperative labs reviewed.  HgbA1c 6.0, glucose 82. Cr 1.58, BUN 26. Renal function consistent with previous results.   EKG 07/21/2014: NSR. Cannot rule out Anterior infarct, age undetermined  Pt tolerated THA back in March without issue. If no changes, I anticipate pt can proceed with surgery as scheduled.   Rica Mast, FNP-BC [facility] Short Stay Surgical Center/Anesthesiology Phone: [phone] 03/05/2015 2:01 PM

## 2015-03-08 NOTE — Progress Notes (Signed)
Followed up with [facility] about requested records, stress test and EKG; office stated that they had no records on patient but to contact [facility] at [phone].  Contacted [facility], spoke with Mardella Layman, who stated that the only records they had on file were OB-GYN records.

## 2015-03-10 MED ORDER — CEFAZOLIN SODIUM-DEXTROSE 2-3 GM-% IV SOLR
2.0000 g | INTRAVENOUS | Status: AC
  Administered 2015-03-11: 2 g via INTRAVENOUS
  Filled 2015-03-10: qty 50

## 2015-03-10 NOTE — Progress Notes (Signed)
Pt called back to confirm that she got message to arrive at 0800 in the morning.

## 2015-03-10 NOTE — Progress Notes (Signed)
Left message for patient to arrive at 800 am 03/11/15.

## 2015-03-11 ENCOUNTER — Ambulatory Visit (HOSPITAL_COMMUNITY): Payer: BLUE CROSS/BLUE SHIELD | Admitting: Emergency Medicine

## 2015-03-11 ENCOUNTER — Encounter (HOSPITAL_COMMUNITY)
Admission: RE | Disposition: A | Discharge status: Home / Self Care | Payer: Self-pay | Source: Ambulatory Visit | Attending: Orthopedic Surgery

## 2015-03-11 ENCOUNTER — Ambulatory Visit (HOSPITAL_COMMUNITY): Payer: BLUE CROSS/BLUE SHIELD

## 2015-03-11 ENCOUNTER — Observation Stay (HOSPITAL_COMMUNITY)
Admission: RE | Admit: 2015-03-11 | Discharge: 2015-03-12 | Disposition: A | Discharge status: Home / Self Care | Payer: BLUE CROSS/BLUE SHIELD | Source: Ambulatory Visit | Attending: Orthopedic Surgery | Admitting: Orthopedic Surgery

## 2015-03-11 ENCOUNTER — Ambulatory Visit (HOSPITAL_COMMUNITY): Payer: BLUE CROSS/BLUE SHIELD | Admitting: Anesthesiology

## 2015-03-11 ENCOUNTER — Encounter (HOSPITAL_COMMUNITY): Payer: Self-pay

## 2015-03-11 DIAGNOSIS — Z96649 Presence of unspecified artificial hip joint: Secondary | ICD-10-CM | POA: Insufficient documentation

## 2015-03-11 DIAGNOSIS — M1990 Unspecified osteoarthritis, unspecified site: Secondary | ICD-10-CM | POA: Insufficient documentation

## 2015-03-11 DIAGNOSIS — I10 Essential (primary) hypertension: Secondary | ICD-10-CM | POA: Insufficient documentation

## 2015-03-11 DIAGNOSIS — Z87891 Personal history of nicotine dependence: Secondary | ICD-10-CM | POA: Diagnosis not present

## 2015-03-11 DIAGNOSIS — M48061 Spinal stenosis, lumbar region without neurogenic claudication: Secondary | ICD-10-CM | POA: Diagnosis present

## 2015-03-11 DIAGNOSIS — Z419 Encounter for procedure for purposes other than remedying health state, unspecified: Secondary | ICD-10-CM

## 2015-03-11 DIAGNOSIS — E119 Type 2 diabetes mellitus without complications: Secondary | ICD-10-CM | POA: Diagnosis not present

## 2015-03-11 DIAGNOSIS — Z8701 Personal history of pneumonia (recurrent): Secondary | ICD-10-CM | POA: Diagnosis not present

## 2015-03-11 DIAGNOSIS — M4806 Spinal stenosis, lumbar region: Principal | ICD-10-CM | POA: Insufficient documentation

## 2015-03-11 DIAGNOSIS — M549 Dorsalgia, unspecified: Secondary | ICD-10-CM | POA: Diagnosis present

## 2015-03-11 DIAGNOSIS — K219 Gastro-esophageal reflux disease without esophagitis: Secondary | ICD-10-CM | POA: Insufficient documentation

## 2015-03-11 DIAGNOSIS — F419 Anxiety disorder, unspecified: Secondary | ICD-10-CM | POA: Insufficient documentation

## 2015-03-11 DIAGNOSIS — M7138 Other bursal cyst, other site: Secondary | ICD-10-CM | POA: Diagnosis not present

## 2015-03-11 DIAGNOSIS — E785 Hyperlipidemia, unspecified: Secondary | ICD-10-CM | POA: Diagnosis not present

## 2015-03-11 HISTORY — PX: LUMBAR LAMINECTOMY/DECOMPRESSION MICRODISCECTOMY: SHX5026

## 2015-03-11 LAB — GLUCOSE, CAPILLARY
GLUCOSE-CAPILLARY: 90 mg/dL (ref 65–99)
GLUCOSE-CAPILLARY: 91 mg/dL (ref 65–99)
Glucose-Capillary: 93 mg/dL (ref 65–99)
Glucose-Capillary: 156 mg/dL — ABNORMAL HIGH (ref 65–99)

## 2015-03-11 SURGERY — LUMBAR LAMINECTOMY/DECOMPRESSION MICRODISCECTOMY 1 LEVEL
Anesthesia: General | Site: Back

## 2015-03-11 MED ORDER — NEOSTIGMINE METHYLSULFATE 10 MG/10ML IV SOLN
INTRAVENOUS | Status: DC | PRN
  Administered 2015-03-11: 5 mg via INTRAMUSCULAR

## 2015-03-11 MED ORDER — LINAGLIPTIN 5 MG PO TABS
5.0000 mg | ORAL_TABLET | Freq: Every day | ORAL | Status: DC
  Administered 2015-03-11: 5 mg via ORAL
  Filled 2015-03-11 (×2): qty 1

## 2015-03-11 MED ORDER — SODIUM CHLORIDE 0.9 % IJ SOLN: 3.0000 mL | Freq: Two times a day (BID) | INTRAMUSCULAR | Status: DC

## 2015-03-11 MED ORDER — HEMOSTATIC AGENTS (NO CHARGE) OPTIME: TOPICAL | Status: DC | PRN

## 2015-03-11 MED ORDER — LACTATED RINGERS IV SOLN
INTRAVENOUS | Status: DC | PRN
  Administered 2015-03-11: 11:00:00 via INTRAVENOUS

## 2015-03-11 MED ORDER — METHOCARBAMOL 500 MG PO TABS
500.0000 mg | ORAL_TABLET | Freq: Four times a day (QID) | ORAL | Status: DC | PRN
  Administered 2015-03-11 – 2015-03-12 (×3): 500 mg via ORAL
  Filled 2015-03-11 (×3): qty 1

## 2015-03-11 MED ORDER — DEXTROSE 5 % IV SOLN
INTRAVENOUS | Status: DC | PRN
  Administered 2015-03-11: 12:00:00 via INTRAVENOUS

## 2015-03-11 MED ORDER — ACETAMINOPHEN 10 MG/ML IV SOLN
1000.0000 mg | Freq: Once | INTRAVENOUS | Status: AC
  Administered 2015-03-11: 1000 mg via INTRAVENOUS
  Filled 2015-03-11: qty 100

## 2015-03-11 MED ORDER — SITAGLIPTIN PHOS-METFORMIN HCL 50-1000 MG PO TABS: 1.0000 | ORAL_TABLET | Freq: Every day | ORAL | Status: DC

## 2015-03-11 MED ORDER — PHENYLEPHRINE HCL 10 MG/ML IJ SOLN
INTRAMUSCULAR | Status: DC | PRN
  Administered 2015-03-11: 40 ug via INTRAVENOUS

## 2015-03-11 MED ORDER — INSULIN ASPART 100 UNIT/ML ~~LOC~~ SOLN: 0.0000 [IU] | Freq: Every day | SUBCUTANEOUS | Status: DC

## 2015-03-11 MED ORDER — ONDANSETRON HCL 4 MG/2ML IJ SOLN
INTRAMUSCULAR | Status: DC | PRN
  Administered 2015-03-11: 4 mg via INTRAVENOUS

## 2015-03-11 MED ORDER — SODIUM CHLORIDE 0.9 % IJ SOLN: 3.0000 mL | INTRAMUSCULAR | Status: DC | PRN

## 2015-03-11 MED ORDER — OXYCODONE-ACETAMINOPHEN 10-325 MG PO TABS: 1.0000 | ORAL_TABLET | ORAL | Status: DC | PRN

## 2015-03-11 MED ORDER — MIDAZOLAM HCL 5 MG/5ML IJ SOLN
INTRAMUSCULAR | Status: DC | PRN
  Administered 2015-03-11: 2 mg via INTRAVENOUS

## 2015-03-11 MED ORDER — DOCUSATE SODIUM 100 MG PO CAPS: 100.0000 mg | ORAL_CAPSULE | Freq: Three times a day (TID) | ORAL | Status: DC | PRN

## 2015-03-11 MED ORDER — FENTANYL CITRATE (PF) 100 MCG/2ML IJ SOLN
INTRAMUSCULAR | Status: DC | PRN
  Administered 2015-03-11 (×2): 50 ug via INTRAVENOUS
  Administered 2015-03-11: 100 ug via INTRAVENOUS
  Administered 2015-03-11 (×2): 50 ug via INTRAVENOUS

## 2015-03-11 MED ORDER — METHOCARBAMOL 500 MG PO TABS: 500.0000 mg | ORAL_TABLET | Freq: Three times a day (TID) | ORAL | Status: DC | PRN

## 2015-03-11 MED ORDER — ACETAMINOPHEN 10 MG/ML IV SOLN
INTRAVENOUS | Status: AC
  Filled 2015-03-11: qty 100

## 2015-03-11 MED ORDER — PROPOFOL 10 MG/ML IV BOLUS
INTRAVENOUS | Status: AC
  Filled 2015-03-11: qty 20

## 2015-03-11 MED ORDER — NEOSTIGMINE METHYLSULFATE 10 MG/10ML IV SOLN: INTRAVENOUS | Status: DC | PRN

## 2015-03-11 MED ORDER — ALPRAZOLAM 0.25 MG PO TABS: 0.2500 mg | ORAL_TABLET | Freq: Every day | ORAL | Status: DC | PRN

## 2015-03-11 MED ORDER — LACTATED RINGERS IV SOLN: INTRAVENOUS | Status: DC

## 2015-03-11 MED ORDER — MORPHINE SULFATE (PF) 2 MG/ML IV SOLN
1.0000 mg | INTRAVENOUS | Status: DC | PRN
  Administered 2015-03-12: 2 mg via INTRAVENOUS
  Filled 2015-03-11: qty 1

## 2015-03-11 MED ORDER — THROMBIN 20000 UNITS EX SOLR
CUTANEOUS | Status: AC
  Filled 2015-03-11: qty 20000

## 2015-03-11 MED ORDER — EZETIMIBE 10 MG PO TABS
10.0000 mg | ORAL_TABLET | Freq: Every day | ORAL | Status: DC
  Administered 2015-03-11: 10 mg via ORAL
  Filled 2015-03-11 (×2): qty 1

## 2015-03-11 MED ORDER — FENTANYL CITRATE (PF) 250 MCG/5ML IJ SOLN
INTRAMUSCULAR | Status: AC
  Filled 2015-03-11: qty 5

## 2015-03-11 MED ORDER — MENTHOL 3 MG MT LOZG: 1.0000 | LOZENGE | OROMUCOSAL | Status: DC | PRN

## 2015-03-11 MED ORDER — ONDANSETRON HCL 4 MG PO TABS: 4.0000 mg | ORAL_TABLET | Freq: Three times a day (TID) | ORAL | Status: DC | PRN

## 2015-03-11 MED ORDER — INSULIN ASPART 100 UNIT/ML ~~LOC~~ SOLN: 0.0000 [IU] | Freq: Three times a day (TID) | SUBCUTANEOUS | Status: DC

## 2015-03-11 MED ORDER — BUPIVACAINE-EPINEPHRINE 0.25% -1:200000 IJ SOLN
INTRAMUSCULAR | Status: DC | PRN
  Administered 2015-03-11: 10 mL

## 2015-03-11 MED ORDER — PHENOL 1.4 % MT LIQD
1.0000 | OROMUCOSAL | Status: DC | PRN
  Administered 2015-03-11: 1 via OROMUCOSAL
  Filled 2015-03-11: qty 177

## 2015-03-11 MED ORDER — LIDOCAINE HCL (CARDIAC) 20 MG/ML IV SOLN
INTRAVENOUS | Status: DC | PRN
  Administered 2015-03-11: 50 mg via INTRAVENOUS

## 2015-03-11 MED ORDER — PROPOFOL 10 MG/ML IV BOLUS
INTRAVENOUS | Status: DC | PRN
  Administered 2015-03-11: 180 mg via INTRAVENOUS

## 2015-03-11 MED ORDER — ONDANSETRON HCL 4 MG/2ML IJ SOLN: 4.0000 mg | INTRAMUSCULAR | Status: DC | PRN

## 2015-03-11 MED ORDER — SODIUM CHLORIDE 0.9 % IV SOLN: 250.0000 mL | INTRAVENOUS | Status: DC

## 2015-03-11 MED ORDER — GLYCOPYRROLATE 0.2 MG/ML IJ SOLN
INTRAMUSCULAR | Status: DC | PRN
  Administered 2015-03-11: 0.6 mg via INTRAVENOUS

## 2015-03-11 MED ORDER — PHENYLEPHRINE 40 MCG/ML (10ML) SYRINGE FOR IV PUSH (FOR BLOOD PRESSURE SUPPORT)
PREFILLED_SYRINGE | INTRAVENOUS | Status: AC
  Filled 2015-03-11: qty 10

## 2015-03-11 MED ORDER — BUPIVACAINE-EPINEPHRINE (PF) 0.25% -1:200000 IJ SOLN
INTRAMUSCULAR | Status: AC
  Filled 2015-03-11: qty 30

## 2015-03-11 MED ORDER — HYDROMORPHONE HCL 1 MG/ML IJ SOLN
0.2500 mg | INTRAMUSCULAR | Status: DC | PRN
  Administered 2015-03-11 (×2): 0.5 mg via INTRAVENOUS

## 2015-03-11 MED ORDER — CEFAZOLIN SODIUM 1-5 GM-% IV SOLN
1.0000 g | Freq: Three times a day (TID) | INTRAVENOUS | Status: AC
  Administered 2015-03-11 – 2015-03-12 (×2): 1 g via INTRAVENOUS
  Filled 2015-03-11 (×2): qty 50

## 2015-03-11 MED ORDER — INSULIN ASPART 100 UNIT/ML ~~LOC~~ SOLN: 0.0000 [IU] | SUBCUTANEOUS | Status: DC

## 2015-03-11 MED ORDER — METHOCARBAMOL 1000 MG/10ML IJ SOLN
500.0000 mg | Freq: Four times a day (QID) | INTRAVENOUS | Status: DC | PRN
  Filled 2015-03-11: qty 5

## 2015-03-11 MED ORDER — MIDAZOLAM HCL 2 MG/2ML IJ SOLN
INTRAMUSCULAR | Status: AC
  Filled 2015-03-11: qty 4

## 2015-03-11 MED ORDER — ROCURONIUM BROMIDE 100 MG/10ML IV SOLN
INTRAVENOUS | Status: DC | PRN
  Administered 2015-03-11: 50 mg via INTRAVENOUS

## 2015-03-11 MED ORDER — METFORMIN HCL 500 MG PO TABS
1000.0000 mg | ORAL_TABLET | Freq: Every day | ORAL | Status: DC
  Administered 2015-03-11: 1000 mg via ORAL
  Filled 2015-03-11: qty 2

## 2015-03-11 MED ORDER — LISINOPRIL 20 MG PO TABS
20.0000 mg | ORAL_TABLET | Freq: Every day | ORAL | Status: DC
  Administered 2015-03-11: 20 mg via ORAL
  Filled 2015-03-11: qty 1

## 2015-03-11 MED ORDER — HYDROMORPHONE HCL 1 MG/ML IJ SOLN
INTRAMUSCULAR | Status: AC
  Administered 2015-03-11: 0.5 mg via INTRAVENOUS
  Filled 2015-03-11: qty 1

## 2015-03-11 MED ORDER — SODIUM CHLORIDE 0.9 % IV SOLN: INTRAVENOUS | Status: DC

## 2015-03-11 MED ORDER — OXYCODONE HCL 5 MG PO TABS
5.0000 mg | ORAL_TABLET | ORAL | Status: DC | PRN
  Administered 2015-03-11 – 2015-03-12 (×5): 10 mg via ORAL
  Filled 2015-03-11 (×5): qty 2

## 2015-03-11 SURGICAL SUPPLY — 64 items
BUR EGG ELITE 4.0 (BURR) IMPLANT
BUR EGG ELITE 4.0MM (BURR)
BUR MATCHSTICK NEURO 3.0 LAGG (BURR) IMPLANT
CANISTER SUCTION 2500CC (MISCELLANEOUS) ×3 IMPLANT
CLOSURE STERI-STRIP 1/2X4 (GAUZE/BANDAGES/DRESSINGS) ×1
CLSR STERI-STRIP ANTIMIC 1/2X4 (GAUZE/BANDAGES/DRESSINGS) ×2 IMPLANT
CORDS BIPOLAR (ELECTRODE) ×3 IMPLANT
COVER SURGICAL LIGHT HANDLE (MISCELLANEOUS) ×3 IMPLANT
DRAIN CHANNEL 15F RND FF W/TCR (WOUND CARE) IMPLANT
DRAPE POUCH INSTRU U-SHP 10X18 (DRAPES) ×3 IMPLANT
DRAPE SURG 17X23 STRL (DRAPES) ×3 IMPLANT
DRAPE U-SHAPE 47X51 STRL (DRAPES) ×3 IMPLANT
DRSG MEPILEX BORDER 4X8 (GAUZE/BANDAGES/DRESSINGS) ×3 IMPLANT
DURAPREP 26ML APPLICATOR (WOUND CARE) ×3 IMPLANT
ELECT BLADE 4.0 EZ CLEAN MEGAD (MISCELLANEOUS)
ELECT CAUTERY BLADE 6.4 (BLADE) ×3 IMPLANT
ELECT PENCIL ROCKER SW 15FT (MISCELLANEOUS) ×3 IMPLANT
ELECT REM PT RETURN 9FT ADLT (ELECTROSURGICAL) ×3
ELECTRODE BLDE 4.0 EZ CLN MEGD (MISCELLANEOUS) IMPLANT
ELECTRODE REM PT RTRN 9FT ADLT (ELECTROSURGICAL) ×1 IMPLANT
EVACUATOR SILICONE 100CC (DRAIN) IMPLANT
GLOVE BIOGEL PI IND STRL 6.5 (GLOVE) ×1 IMPLANT
GLOVE BIOGEL PI IND STRL 7.5 (GLOVE) ×1 IMPLANT
GLOVE BIOGEL PI IND STRL 8 (GLOVE) ×1 IMPLANT
GLOVE BIOGEL PI IND STRL 8.5 (GLOVE) ×1 IMPLANT
GLOVE BIOGEL PI INDICATOR 6.5 (GLOVE) ×2
GLOVE BIOGEL PI INDICATOR 7.5 (GLOVE) ×2
GLOVE BIOGEL PI INDICATOR 8 (GLOVE) ×2
GLOVE BIOGEL PI INDICATOR 8.5 (GLOVE) ×2
GLOVE ORTHO TXT STRL SZ7.5 (GLOVE) ×3 IMPLANT
GLOVE SS BIOGEL STRL SZ 8.5 (GLOVE) ×1 IMPLANT
GLOVE SUPERSENSE BIOGEL SZ 8.5 (GLOVE) ×2
GLOVE SURG SS PI 6.5 STRL IVOR (GLOVE) ×9 IMPLANT
GOWN STRL REUS W/ TWL LRG LVL3 (GOWN DISPOSABLE) ×4 IMPLANT
GOWN STRL REUS W/TWL 2XL LVL3 (GOWN DISPOSABLE) ×6 IMPLANT
GOWN STRL REUS W/TWL LRG LVL3 (GOWN DISPOSABLE) ×8
KIT BASIN OR (CUSTOM PROCEDURE TRAY) ×3 IMPLANT
KIT ROOM TURNOVER OR (KITS) ×3 IMPLANT
NEEDLE 22X1 1/2 (OR ONLY) (NEEDLE) ×3 IMPLANT
NEEDLE SPNL 18GX3.5 QUINCKE PK (NEEDLE) ×6 IMPLANT
NS IRRIG 1000ML POUR BTL (IV SOLUTION) ×3 IMPLANT
PACK LAMINECTOMY ORTHO (CUSTOM PROCEDURE TRAY) ×3 IMPLANT
PACK UNIVERSAL I (CUSTOM PROCEDURE TRAY) ×3 IMPLANT
PAD ARMBOARD 7.5X6 YLW CONV (MISCELLANEOUS) ×6 IMPLANT
PATTIES SURGICAL .5 X.5 (GAUZE/BANDAGES/DRESSINGS) IMPLANT
PATTIES SURGICAL .5 X1 (DISPOSABLE) ×3 IMPLANT
SPONGE SURGIFOAM ABS GEL 100 (HEMOSTASIS) IMPLANT
SURGIFLO W/THROMBIN 8M KIT (HEMOSTASIS) IMPLANT
SUT BONE WAX W31G (SUTURE) ×3 IMPLANT
SUT MON AB 3-0 SH 27 (SUTURE) ×2
SUT MON AB 3-0 SH27 (SUTURE) ×1 IMPLANT
SUT VIC AB 0 CT1 27 (SUTURE) ×2
SUT VIC AB 0 CT1 27XBRD ANBCTR (SUTURE) ×1 IMPLANT
SUT VIC AB 1 CT1 18XCR BRD 8 (SUTURE) ×1 IMPLANT
SUT VIC AB 1 CT1 8-18 (SUTURE) ×2
SUT VIC AB 1 CTX 36 (SUTURE) ×4
SUT VIC AB 1 CTX36XBRD ANBCTR (SUTURE) ×2 IMPLANT
SUT VIC AB 2-0 CT1 18 (SUTURE) ×3 IMPLANT
SYR BULB IRRIGATION 50ML (SYRINGE) ×3 IMPLANT
SYR CONTROL 10ML LL (SYRINGE) ×3 IMPLANT
TOWEL OR 17X24 6PK STRL BLUE (TOWEL DISPOSABLE) ×3 IMPLANT
TOWEL OR 17X26 10 PK STRL BLUE (TOWEL DISPOSABLE) ×3 IMPLANT
WATER STERILE IRR 1000ML POUR (IV SOLUTION) ×3 IMPLANT
YANKAUER SUCT BULB TIP NO VENT (SUCTIONS) ×3 IMPLANT

## 2015-03-11 NOTE — Anesthesia Postprocedure Evaluation (Signed)
  Anesthesia Post-op Note  Patient: Cassidy Green  Procedure(s) Performed: Procedure(s): Lumbar Four-Five DECOMPRESSION  AND REMOVAL SYNOVAL CYST  Patient Location: PACU  Anesthesia Type:General  Level of Consciousness: awake and alert   Airway and Oxygen Therapy: Patient Spontanous Breathing  Post-op Pain: Controlled  Post-op Assessment: Post-op Vital signs reviewed, Patient's Cardiovascular Status Stable and Respiratory Function Stable  Post-op Vital Signs: Reviewed  Filed Vitals:   03/11/15 1400  BP: 140/72  Pulse: 65  Temp:   Resp: 14    Complications: No apparent anesthesia complications

## 2015-03-11 NOTE — Progress Notes (Signed)
Iv infusing on arrival to pacu 

## 2015-03-11 NOTE — H&P (Signed)
History of Present Illness The patient is a 59 year old female who presents today for follow up of their back. The patient is being followed for their right-sided back pain. Symptoms reported today include: pain (right low back), pain at night, aching, throbbing, stiffness, locking, pain with weightbearing, difficulty arising from chair, weakness (right leg), numbness (right great toe), leg pain (right), foot pain, pain with lying, pain with lifting, pain with sitting and pain with standing, while the patient does not report symptoms of: swelling, catching, popping, grinding, giving way, instability, difficulty ambulating, pain with overhead motions or urinary incontinence. The patient states that they are doing poorly. Current treatment includes: home exercise program, activity modification and NSAIDs (Aleve). The following medication has been used for pain control: antiinflammatory medication (Aleve) and Hydrocodone (5/325 "left over from hip surgery"). The patient reports their current pain level to be 7 / 10 (and 1/2). The patient presents today following MRI (had MRI done on 10/24/14). The patient indicates that they have questions or concerns today regarding their progress at this point. Note for "Follow-up back": Patient was not able to get injection. She was scheduled for hip surgery.  Additional reasons for visit:  H & P is described as the following: The patient is scheduled for a L4-5 Decompression and removal synovial cyst to be performed by Dr. Debria Garretahari D. Shon BatonBrooks, MD at [facility] on 03/11/15 . Please see the hospital record for complete dictated history and physical. The pt reports a non painful vaginal cyst she has recently discovered. The pt has DM. Her last A1c was 5.9.   Allergies  No Known Drug Allergies01/19/2016  Family History  Cerebrovascular Accident Father, Mother. First Degree Relatives Hypertension Father, Mother.  Social History  Tobacco / smoke exposure  06/16/2014: no Tobacco use Former smoker. 06/16/2014 Not under pain contract Children 1 Current work status working full time Living situation live with spouse No history of drug/alcohol rehab Current drinker 06/16/2014: Currently drinks wine only occasionally per week Number of flights of stairs before winded 2-3 Marital status married  Medication History Amoxicillin (500MG  Tablet, 4 tablet(s) Oral 1-2 hour prior to dental work, Taken starting 02/04/2015) Active. Vimovo (500-20MG  Tablet DR, 1 (one) Tablet DR Oral two times daily, Taken starting 12/04/2014) Active. Aleve (220MG  Capsule, Oral) Active. (PRN) Zetia (10MG  Tablet, Oral) Active. ALPRAZolam (0.25MG  Tablet, Oral) Active. (prn) BuPROPion HCl ER (XL) (150MG  Tablet ER 24HR, Oral) Active. (prn) Folic Acid (1MG  Tablet, Oral) Active. (qd) Janumet (50-1000MG  Tablet, Oral) Active. (qd) Omeprazole (40MG  Capsule DR, Oral) Active. (qd) Medications Reconciled  Vitals  03/01/2015 1:48 PM Weight: 136 lb Height: 62in Body Surface Area: 1.62 m Body Mass Index: 24.87 kg/m  Temp.: 98.47F  Pulse: 92 (Regular)  BP: 143/92 (Sitting, Left Arm, Standard)  General General Appearance-Not in acute distress. Orientation-Oriented X3. Build & Nutrition-Well nourished and Well developed.  Integumentary General Characteristics Surgical Scars - surgical scarring consistent with previous left hip surgery, no surgical scar evidence of previous lumbar surgery. Lumbar Spine-Skin examination of the lumbar spine is without deformity, skin lesions, lacerations or abrasions.  Chest and Lung Exam Auscultation Breath sounds - Normal and Clear.  Cardiovascular Auscultation Rhythm - Regular rate and rhythm.  Abdomen Palpation/Percussion Palpation and Percussion of the abdomen reveal - Soft, Non Tender and No Rebound tenderness.  Peripheral Vascular Lower Extremity Palpation - Posterior tibial pulse -  Bilateral - 2+. Dorsalis pedis pulse - Bilateral - 2+.  Neurologic Sensation Lower Extremity - Left - sensation is intact  in the lower extremity. Right - sensation is diminished in the lower extremity. Reflexes Patellar Reflex - Bilateral - 2+. Achilles Reflex - Bilateral - 2+. Clonus - Bilateral - clonus not present. Hoffman's Sign - Bilateral - Hoffman's sign not present. Testing Seated Straight Leg Raise - Right - Seated straight leg raise positive.  Musculoskeletal Spine/Ribs/Pelvis  Lumbosacral Spine: Inspection and Palpation - Tenderness - right lumbar paraspinals tender to palpation, bony and soft tissue palpation of the lumbar spine and SI joint does not recreate their typical pain. Strength and Tone: Strength - Hip Flexion - Bilateral - 5/5. Knee Extension - Bilateral - 5/5. Knee Flexion - Bilateral - 5/5. Ankle Dorsiflexion - Bilateral - 5/5. Ankle Plantarflexion - Bilateral - 5/5. Heel walk - Left - able to heel walk without difficulty. Right - unable to heel walk. Toe Walk - Bilateral - able to walk on toes with moderate difficulty. Heel-Toe Walk - Bilateral - able to heel-toe walk with mild difficulty. ROM - Flexion - full range of motion. Extension - full range of motion. Left Lateral Bending - full range of motion. Right Lateral Bending - full range of motion. Right Rotation - full range of motion. Left Rotation - full range of motion. Pain - neither flexion or extension is more painful than the other. Lumbosacral Spine - Waddell's Signs - no Waddell's signs present. Lower Extremity Range of Motion - No true hip, knee or ankle pain with range of motion. Gait and Station - [employer] - no assistive devices.  IMAGING STUDY Her MRI showed a left L4-L5 synovial cyst possibly even hemorrhagic cyst causing compression of the L5 and even the S1 nerve root. At this point, I do think this is a source of her neuropathic right leg pain.  Assessment & Plan  Synovial cyst of lumbar spine    Goal Of Surgery:Discussed that goal of surgery is to reduce pain and improve function and quality of life. Patient is aware that despite all appropriate treatment that there pain and function could be the same, worse, or different. Posterior Lumbar Decompression/disectomy: Risks of surgery include infection, bleeding, nerve damage, death, stroke, paralysis, failure to heal, need for further surgery, ongoing or worse pain, need for further surgery, CSF leak, loss of bowel or bladder, and recurrent disc herniation or Stenosis which would necessitate need for further surgery.   Recommendation would be to do a lumbar decompression and excision of the cyst. We have gone over this surgery. I will discuss the risks, which include infection, bleeding, nerve damage, death, stroke, paralysis, failure to heal, need for further surgery, ongoing or worse pain, and recurrence of the cyst. Her questions were addressed. She is concerned because she has already had two operations this year, a hip replacement and then an I and D for infection. I told her to talk with her primary care physician and make sure that she is medically cleared for surgery and I will discuss her case with Dr. Linna Caprice to make sure there is no issue in moving forward with addressing the spine problem. I will see her again after we get her preop clearance and determine what is the next best course of action.

## 2015-03-11 NOTE — Transfer of Care (Signed)
Immediate Anesthesia Transfer of Care Note  Patient: Cassidy Green  Procedure(s) Performed: Procedure(s): Lumbar Four-Five DECOMPRESSION  AND REMOVAL SYNOVAL CYST  Patient Location: PACU  Anesthesia Type:General  Level of Consciousness: awake, alert , oriented, patient cooperative and lethargic  Airway & Oxygen Therapy: Patient Spontanous Breathing and Patient connected to nasal cannula oxygen  Post-op Assessment: Report given to RN, Post -op Vital signs reviewed and stable, Patient moving all extremities X 4 and Patient able to stick tongue midline  Post vital signs: stable  Last Vitals:  Filed Vitals:   03/11/15 0842  BP: 139/71  Pulse: 78  Temp: 36.5 C  Resp: 16    Complications: No apparent anesthesia complications

## 2015-03-11 NOTE — Anesthesia Preprocedure Evaluation (Addendum)
Anesthesia Evaluation  Patient identified by MRN, date of birth, ID band Patient awake    Reviewed: Allergy & Precautions, H&P , NPO status , Patient's Chart, lab work & pertinent test results  Airway Mallampati: II  TM Distance: >3 FB Neck ROM: Full    Dental no notable dental hx. (+) Teeth Intact, Dental Advisory Given   Pulmonary neg pulmonary ROS, former smoker,    Pulmonary exam normal breath sounds clear to auscultation       Cardiovascular hypertension, Pt. on medications  Rhythm:Regular Rate:Normal     Neuro/Psych  Headaches, Anxiety negative psych ROS   GI/Hepatic Neg liver ROS, GERD  Medicated and Controlled,  Endo/Other  diabetes, Type 2, Oral Hypoglycemic Agents  Renal/GU negative Renal ROS  negative genitourinary   Musculoskeletal  (+) Arthritis , Osteoarthritis,    Abdominal   Peds  Hematology negative hematology ROS (+)   Anesthesia Other Findings   Reproductive/Obstetrics negative OB ROS                            Anesthesia Physical Anesthesia Plan  ASA: II  Anesthesia Plan: General   Post-op Pain Management:    Induction: Intravenous  Airway Management Planned: Oral ETT  Additional Equipment:   Intra-op Plan:   Post-operative Plan: Extubation in OR  Informed Consent: I have reviewed the patients History and Physical, chart, labs and discussed the procedure including the risks, benefits and alternatives for the proposed anesthesia with the patient or authorized representative who has indicated his/her understanding and acceptance.   Dental advisory given  Plan Discussed with: CRNA  Anesthesia Plan Comments:        Anesthesia Quick Evaluation

## 2015-03-11 NOTE — Transfer of Care (Signed)
Immediate Anesthesia Transfer of Care Note  Patient: Cassidy CandySusan S Gerbino  Procedure(s) Performed: Procedure(s): Lumbar Four-Five DECOMPRESSION  AND REMOVAL SYNOVAL CYST  Patient Location: PACU  Anesthesia Type:General  Level of Consciousness: awake, alert , oriented, patient cooperative and responds to stimulation  Airway & Oxygen Therapy: Patient Spontanous Breathing and Patient connected to nasal cannula oxygen  Post-op Assessment: Report given to RN, Post -op Vital signs reviewed and stable, Patient moving all extremities X 4 and Patient able to stick tongue midline  Post vital signs: Reviewed and stable  Last Vitals:  Filed Vitals:   03/11/15 0842  BP: 139/71  Pulse: 78  Temp: 36.5 C  Resp: 16    Complications: No apparent anesthesia complications

## 2015-03-11 NOTE — Anesthesia Procedure Notes (Signed)
Procedure Name: Intubation Date/Time: 03/11/2015 10:58 AM Performed by: Marylyn IshiharaFURLOW, Landan Fedie Pre-anesthesia Checklist: Patient identified, Timeout performed, Emergency Drugs available, Suction available and Patient being monitored Patient Re-evaluated:Patient Re-evaluated prior to inductionOxygen Delivery Method: Circle system utilized Preoxygenation: Pre-oxygenation with 100% oxygen Intubation Type: IV induction Ventilation: Mask ventilation without difficulty Laryngoscope Size: Mac and 3 Grade View: Grade I Tube size: 7.0 mm Number of attempts: 1 Airway Equipment and Method: Stylet Placement Confirmation: ETT inserted through vocal cords under direct vision,  breath sounds checked- equal and bilateral and positive ETCO2 Secured at: 21 cm Tube secured with: Tape Dental Injury: Teeth and Oropharynx as per pre-operative assessment

## 2015-03-11 NOTE — Progress Notes (Signed)
erport given to maria rn as caregiver 

## 2015-03-11 NOTE — Brief Op Note (Signed)
03/11/2015  12:55 PM  PATIENT:  Lonzo CandySusan S Totino  59 y.o. female  PRE-OPERATIVE DIAGNOSIS:  SYNOVAL FACET CYST AND STENOSIS  POST-OPERATIVE DIAGNOSIS:  SYNOVAL FACET CYST AND STENOSIS  PROCEDURE:  Procedure(s): Lumbar Four-Five DECOMPRESSION  AND REMOVAL SYNOVAL CYST  SURGEON:  Surgeon(s) and Role:    * Venita Lickahari Felita Bump, MD - Primary  PHYSICIAN ASSISTANT:   ASSISTANTS: Carmen Mayo   ANESTHESIA:   general  EBL:  Total I/O In: 700 [I.V.:700] Out: -   BLOOD ADMINISTERED:none  DRAINS: none   LOCAL MEDICATIONS USED:  MARCAINE     SPECIMEN:  Source of Specimen:  synovial cyst  DISPOSITION OF SPECIMEN:  PATHOLOGY  COUNTS:  YES  TOURNIQUET:  * No tourniquets in log *  DICTATION: .Other Dictation: Dictation Number [id number]  PLAN OF CARE: Admit for overnight observation  PATIENT DISPOSITION:  PACU - hemodynamically stable.

## 2015-03-12 ENCOUNTER — Encounter (HOSPITAL_COMMUNITY): Payer: Self-pay | Admitting: Orthopedic Surgery

## 2015-03-12 DIAGNOSIS — M549 Dorsalgia, unspecified: Secondary | ICD-10-CM | POA: Diagnosis present

## 2015-03-12 DIAGNOSIS — M4806 Spinal stenosis, lumbar region: Secondary | ICD-10-CM | POA: Diagnosis not present

## 2015-03-12 LAB — GLUCOSE, CAPILLARY: GLUCOSE-CAPILLARY: 91 mg/dL (ref 65–99)

## 2015-03-12 LAB — POCT I-STAT 4, (NA,K, GLUC, HGB,HCT)
Glucose, Bld: 84 mg/dL (ref 65–99)
HEMATOCRIT: 27 % — AB (ref 36.0–46.0)
HEMOGLOBIN: 9.2 g/dL — AB (ref 12.0–15.0)
Potassium: 4.2 mmol/L (ref 3.5–5.1)
Sodium: 142 mmol/L (ref 135–145)

## 2015-03-12 NOTE — Progress Notes (Signed)
Patient alert and oriented, mae's well, voiding adequate amount of urine, swallowing without difficulty, c/o mild pain and medication given prior to discharged.. Patient discharged home with family. Script and discharged instructions given to patient. Patient and family stated understanding of d/c instructions given and has an appointment with MD. 

## 2015-03-12 NOTE — Progress Notes (Signed)
Orthopedic Tech Progress Note Patient Details:  Cassidy CandySusan S Green 22-Oct-1955 [id number]  Patient ID: Cassidy Green, female   DOB: 22-Oct-1955, 59 y.o.   MRN: [id number]   Cassidy Green 03/12/2015, 9:51 AMCalled bio-[employer] for lumbar brace.

## 2015-03-12 NOTE — Progress Notes (Signed)
    Subjective: Procedure(s): Lumbar Four-Five DECOMPRESSION  AND REMOVAL SYNOVAL CYST 1 Day Post-Op  Patient reports pain as 2 on 0-10 scale.  Reports decreased leg pain reports incisional back pain   Negative void Negative bowel movement Positive flatus Negative chest pain or shortness of breath  Objective: Vital signs in last 24 hours: Temp:  [97.7 F (36.5 C)-98.9 F (37.2 C)] 98.9 F (37.2 C) (10/14 0428) Pulse Rate:  [61-93] 93 (10/14 0428) Resp:  [11-22] 18 (10/14 0428) BP: (125-159)/(63-115) 125/68 mmHg (10/14 0428) SpO2:  [95 %-100 %] 96 % (10/14 0428) Weight:  [61.236 kg (135 lb)] 61.236 kg (135 lb) (10/13 0842)  Intake/Output from previous day: 10/13 0701 - 10/14 0700 In: 1300 [I.V.:1300] Out: 200 [Urine:200]  Labs:  Recent Labs  03/11/15 1134  HCT 27.0*    Recent Labs  03/11/15 1134  NA 142  K 4.2  GLUCOSE 84   No results for input(s): LABPT, INR in the last 72 hours.  Physical Exam: Neurologically intact ABD soft Intact pulses distally Incision: dressing C/D/I Compartment soft  Assessment/Plan: Patient stable  xrays N/A Continue mobilization with physical therapy Continue care  Advance diet Up with therapy  Plan on d/c to home today  Doing well  Venita Lickahari Rashawn Rayman, MD [facility] [phone]

## 2015-03-12 NOTE — Op Note (Signed)
NAMSimonne Martinet:  Dusing, Lauran                 ACCOUNT NO.:  [id number]  MEDICAL RECORD NO.:  [id number]  LOCATION:  3C11C                        FACILITY:  [facility]  PHYSICIAN:  Kaydon Husby D. Shon BatonBrooks, M.D. 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  OPERATIVE PROCEDURE:  Posterior lumbar laminotomy and decompression of synovial cyst.  FIRST ASSISTANT:  Anette Riedelarmen Mayo, PA.  COMPLICATIONS:  None.  INTRAOPERATIVE FINDINGS:  Large posterolateral synovial cyst, which was excised and sent to Pathology for permanent.  Adequate neural decompression as well as thecal sac decompression.  HISTORY:  This is a very pleasant 59 year old woman who has been having progressive back, buttock and right radicular leg pain.  Imaging studies confirmed a synovial cyst causing neural compression.  As a result of the failure of conservative management, we elected to proceed with surgery.  All appropriate risks, benefits, and alternatives were discussed with the patient and consent was obtained.  OPERATIVE NOTE:  The patient was brought to the operating theater and placed supine on the operating table.  After successful induction of general anesthesia and endotracheal intubation, TEDs and SCDs were applied.  She was turned prone onto the Wilson frame and all bony prominences were well padded.  The back was prepped and draped in a standard fashion.  Time-out was taken confirming patient, procedure and all other pertinent important data.  Once this was done, needles were placed into the back and x-ray was taken, and we identified the L4-5 disk space level.  I infiltrated the midline incision site with 0.25% Marcaine.  I then made a midline incision centered over the  C4-5 disk. Sharp dissection was carried out down to the deep fascia.  Deep fascia was sharply incised.  I used Cobb elevator and Bovie to dissect the paraspinal muscles to expose the L4 and L5 spinous process, lamina and facet complex.  I placed a Penfield 4 underneath the L4 lamina and took a second x-ray confirming the level.  Once I confirmed the L4 level, I then placed a Taylor retractor on the lateral side of the facet complex and held it in place.  I now had excellent visualization of the right posterolateral side of the spine.  Using a 3-mm Kerrison rongeur, I created a laminotomy defect of L4.  I then used my Penfield 4 to dissect through the ligamentum flavum and began resecting this.  I then identified the synovial cyst and began gently dissecting it free of the thecal sac.  I made sure that my laminotomy of L4 was adequate enough that I was well above the cyst so I could develop a normal anatomical plan.  I also went inferiorly resecting some of the superior portion of the L5 lamina, again so I can get above and below the cyst.  Once this was done, I was able to began my dissection of the cyst.  I removed the ligamentum flavum and began dissecting the synovial cyst wall from the  thecal sac using my Penfield 4.  It was quite adherent, but with gentle traction and manipulation using the Penfield 4, I was able to be separate it.  I identified the L5 nerve root and made sure that it was not under tension as I was removing the cyst.  The cyst was then excised and sent to Pathology for permanent specimen.  I then continued in the lateral recess resecting the osteophyte from the medial aspect of the L5 superior facet.  I disrupted the facet capsule itself so that the cyst could not reform.  At this point, I noticed the thecal sac was still indented from the compression of the cyst.  However, there was no further compression of the thecal sac.  I was able to take my nerve hook and  passed it out the neural foramen underneath the L5 nerve root.  The L5 nerve root itself was no longer compressed.  I was able to go superiorly and inferiorly confirming an adequate decompression.  At this point, I irrigated the wound copiously with normal saline and made sure I had hemostasis using bipolar electrocautery.  I then placed a Gelfoam over the laminotomy site and then closed the deep fascia with interrupted #1 Vicryl sutures.  Superficial was done with 2-0 Vicryl sutures and a 3-0 Monocryl for the skin.  Steri-Strips and a dry dressing were applied.  The patient was ultimately extubated, transferred to the PACU without incident.  At the end of the case, all needle and sponge counts were correct.  My first assistant was [facility], my [place].     Elvy Mclarty D. Shon Baton, M.D.     DDB/MEDQ  D:  03/11/2015  T:  03/12/2015  Job:  [id number]

## 2015-03-12 NOTE — Evaluation (Signed)
Occupational Therapy Evaluation Patient Details Name: Cassidy Green MRN: [id number] DOB: 1956/04/18 Today's Date: 03/12/2015    History of Present Illness 59 y.o female, s/p posterior lumbar laminotomy and decompression of synovial cyst.   Clinical Impression   Pt admitted to hospital due to reason stated above. Pt currently with functional limitiations due to the deficits listed below (see OT problem list). Prior to admission pt was functioning at a modified independent level with ADLs. Pt currently requires min assistance for safety with ADLs. Pt educated and provided with handout regarding back precautions, pt able to verbalize precautions using teach back method. Pt reports husband will be able to assist upon discharge home, as well as she will have HomeHealth aide coming to her home starting Monday to assist with ADLs. All education complete, however therapist feels pt will benefit from intermittent supervision for safety with ADLs and balance while maintaining back precautions to remain safe at home.    Follow Up Recommendations  Supervision - Intermittent    Equipment Recommendations  None recommended by OT    Recommendations for Other Services       Precautions / Restrictions Precautions Precautions: Back;Fall Precaution Booklet Issued: Yes (comment) Precaution Comments: educated pt regarding back precautions. Pt does not have back brace, however pt reported back brace will be coming and MD confirmed pt's report. Restrictions Weight Bearing Restrictions: No      Mobility Bed Mobility Overal bed mobility: Needs Assistance Bed Mobility: Rolling;Sidelying to Sit Rolling: Min assist Sidelying to sit: Min assist       General bed mobility comments: Pt educated in log roll technique unable to perform without min A. Bed set at higher level to simulate bed mobility at home, pt reports having a high bed.  Transfers Overall transfer level: Needs assistance Equipment used:  1 person hand held assist Transfers: Sit to/from Stand Sit to Stand: Min assist         General transfer comment: verbal cues for hand placement and x2 attempt to complete sit to stand    Balance Overall balance assessment: Needs assistance Sitting-balance support: No upper extremity supported;Feet supported Sitting balance-Leahy Scale: Good     Standing balance support: No upper extremity supported Standing balance-Leahy Scale: Poor Standing balance comment: requires single UE support to stand, however once standing pt does not need support to remain standing                            ADL Overall ADL's : Needs assistance/impaired Eating/Feeding: Independent;Sitting   Grooming: Wash/dry face;Oral care;Min guard;Adhering to UE precautions;Standing Grooming Details (indicate cue type and reason): educated pt to use two cup method while brushing teeth to avoid bending Upper Body Bathing: Minimal assitance;Sitting   Lower Body Bathing: Minimal assistance   Upper Body Dressing : Minimal assistance;Standing   Lower Body Dressing: Minimal assistance;Sit to/from stand   Toilet Transfer: Min guard;Ambulation;Comfort height toilet   Toileting- Clothing Manipulation and Hygiene: Min guard;Sit to/from stand       Functional mobility during ADLs: Min guard General ADL Comments: Pt reports she will have HomeHealth aide to assist with ADLs. Pt reports having AE of sock-aid, reacher and long handle sponge from previous hip surgery     Vision     Perception     Praxis      Pertinent Vitals/Pain Pain Assessment: 0-10 Pain Score: 6  Pain Location: back Pain Descriptors / Indicators: Aching;Grimacing;Guarding Pain Intervention(s): Monitored during session;Repositioned;Patient  requesting pain meds-RN notified     Hand Dominance Right   Extremity/Trunk Assessment Upper Extremity Assessment Upper Extremity Assessment: Overall WFL for tasks assessed   Lower  Extremity Assessment Lower Extremity Assessment: Defer to PT evaluation   Cervical / Trunk Assessment Cervical / Trunk Assessment: Normal   Communication Communication Communication: No difficulties   Cognition Arousal/Alertness: Awake/alert Behavior During Therapy: WFL for tasks assessed/performed Overall Cognitive Status: Within Functional Limits for tasks assessed                     General Comments    Pt educated in back precautions, adaptive equipment and back brace wearing schedule.    Exercises       Shoulder Instructions      Home Living Family/patient expects to be discharged to:: Private residence Living Arrangements: Spouse/significant other Available Help at Discharge: Family;Personal care attendant;Available PRN/intermittently Type of Home: House Home Access: Stairs to enter Entergy CorporationEntrance Stairs-Number of Steps: 3 Entrance Stairs-Rails: Right;Left Home Layout: One level     Bathroom Shower/Tub: Producer, television/film/videoWalk-in shower   Bathroom Toilet: Handicapped height Bathroom Accessibility: Yes   Home Equipment: Environmental consultantWalker - 2 wheels;Cane - single point;Bedside commode;Hand held shower head;Adaptive equipment Adaptive Equipment: Reacher;Sock aid;Long-handled sponge Additional Comments: Pt reports reacher is broke and she will purchase another one      Prior Functioning/Environment Level of Independence: Independent             OT Diagnosis: Generalized weakness;Acute pain   OT Problem List: Decreased activity tolerance;Impaired balance (sitting and/or standing);Pain   OT Treatment/Interventions:      OT Goals(Current goals can be found in the care plan section) Acute Rehab OT Goals Patient Stated Goal: to return home  OT Frequency:     Barriers to D/C:            Co-evaluation              End of Session Equipment Utilized During Treatment: Gait belt Nurse Communication: Patient requests pain meds  Activity Tolerance: Patient tolerated treatment  well Patient left: in chair;with call bell/phone within reach   Time: 0735-0813 OT Time Calculation (min): 38 min Charges:  OT General Charges $OT Visit: 1 Procedure OT Evaluation $Initial OT Evaluation Tier I: 1 Procedure OT Treatments $Self Care/Home Management : 23-37 mins G-Codes:    Smiley HousemanJames Landon Evren Shankland 03/12/2015, 10:11 AM

## 2015-03-12 NOTE — Evaluation (Signed)
Physical Therapy Evaluation Patient Details Name: Cassidy Green MRN: [id number] DOB: Jan 05, 1956 Today's Date: 03/12/2015   History of Present Illness  59 y.o female, s/p posterior lumbar laminotomy and decompression of synovial cyst.  Clinical Impression  Pt admitted with above diagnosis. Pt currently with functional limitations due to the deficits listed below (see PT Problem List). At the time of PT eval pt was able to perform transfers and ambulation with some assistance. Pt reports having to compensate with mobility due to pain prior to surgery, and it is possible that postural muscle imbalance has developed on L side as a result. Recommending follow-up PT at neuro outpatient for continued strengthening and postural improvement. Pt will benefit from skilled PT to increase their independence and safety with mobility to allow discharge to the venue listed below.       Follow Up Recommendations Outpatient PT;Supervision for mobility/OOB    Equipment Recommendations  None recommended by PT    Recommendations for Other Services       Precautions / Restrictions Precautions Precautions: Back;Fall Precaution Booklet Issued: Yes (comment) Precaution Comments: educated pt regarding back precautions. Pt does not have back brace, however pt reported back brace will be coming and MD confirmed pt's report. Restrictions Weight Bearing Restrictions: No      Mobility  Bed Mobility Overal bed mobility: Needs Assistance Bed Mobility: Rolling;Sidelying to Sit Rolling: Min assist Sidelying to sit: Min assist       General bed mobility comments: Reviewed log roll with pt. Education regarding technique and maintaining precautions in home environment.   Transfers Overall transfer level: Needs assistance Equipment used: 1 person hand held assist Transfers: Sit to/from Stand Sit to Stand: Min guard         General transfer comment: VC's for hand placement and increased time to complete  sit to stand  Ambulation/Gait Ambulation/Gait assistance: Min guard;Supervision Ambulation Distance (Feet): 400 Feet Assistive device: Rolling walker (2 wheeled) Gait Pattern/deviations: Step-through pattern;Decreased stride length;Narrow base of support Gait velocity: Very slow Gait velocity interpretation: Below normal speed for age/gender General Gait Details: Ambulated in room without AD, however in hall was given RW for support. Pt was able to improve gait speed, posture, and confidence with walker use.   Stairs Stairs: Yes Stairs assistance: Min guard Stair Management: One rail Right;Sideways Number of Stairs: 3 General stair comments: VC's for sequencing and technique. Pt was instructed in maintaining back precautions with sideways negotiation of stairs so she can hold rail with 2 hands.   Wheelchair Mobility    Modified Rankin (Stroke Patients Only)       Balance Overall balance assessment: Needs assistance Sitting-balance support: Feet supported;No upper extremity supported Sitting balance-Leahy Scale: Good     Standing balance support: No upper extremity supported;During functional activity Standing balance-Leahy Scale: Fair Standing balance comment: requires single UE support to stand, however once standing pt does not need support to remain standing                             Pertinent Vitals/Pain Pain Assessment: 0-10 Pain Score: 4  Pain Location: back Pain Descriptors / Indicators: Operative site guarding;Grimacing Pain Intervention(s): Limited activity within patient's tolerance;Monitored during session;Repositioned    Home Living Family/patient expects to be discharged to:: Private residence Living Arrangements: Spouse/significant other Available Help at Discharge: Family;Personal care attendant;Available PRN/intermittently Type of Home: House Home Access: Stairs to enter Entrance Stairs-Rails: Right;Left Entrance Stairs-Number of Steps:  3  Home Layout: One level Home Equipment: Walker - 2 wheels;Cane - single point;Bedside commode;Hand held shower head;Adaptive equipment Additional Comments: Pt reports reacher is broke and she will purchase another one    Prior Function Level of Independence: Independent               Hand Dominance   Dominant Hand: Right    Extremity/Trunk Assessment   Upper Extremity Assessment: LUE deficits/detail       LUE Deficits / Details: Noted a significant L shoulder depression. Possibly muscle imbalance from pain compensation prior to surgery. Shoulder girdle musculature appeared lax.    Lower Extremity Assessment: Overall WFL for tasks assessed      Cervical / Trunk Assessment: Other exceptions  Communication   Communication: No difficulties  Cognition Arousal/Alertness: Awake/alert Behavior During Therapy: WFL for tasks assessed/performed Overall Cognitive Status: Within Functional Limits for tasks assessed                      General Comments General comments (skin integrity, edema, etc.): Pt educated in back precautions, adaptive equipment and back brace wearing schedule.    Exercises        Assessment/Plan    PT Assessment Patient needs continued PT services  PT Diagnosis Difficulty walking;Acute pain   PT Problem List Decreased strength;Decreased range of motion;Decreased activity tolerance;Decreased balance;Decreased mobility;Decreased knowledge of use of DME;Decreased safety awareness;Decreased knowledge of precautions;Pain  PT Treatment Interventions DME instruction;Gait training;Stair training;Therapeutic activities;Functional mobility training;Therapeutic exercise;Neuromuscular re-education;Patient/family education   PT Goals (Current goals can be found in the Care Plan section) Acute Rehab PT Goals Patient Stated Goal: to return home PT Goal Formulation: With patient Time For Goal Achievement: 03/19/15 Potential to Achieve Goals: Good     Frequency Min 5X/week   Barriers to discharge        Co-evaluation               End of Session Equipment Utilized During Treatment: Back brace Activity Tolerance: Patient tolerated treatment well Patient left: in chair;with call bell/phone within reach Nurse Communication: Mobility status    Functional Assessment Tool Used: Clinical judgement Functional Limitation: Mobility: Walking and moving around Mobility: Walking and Moving Around Current Status ([id number]): At least 20 percent but less than 40 percent impaired, limited or restricted Mobility: Walking and Moving Around Goal Status [phone]([id number]): At least 20 percent but less than 40 percent impaired, limited or restricted    Time: 0920-0943 PT Time Calculation (min) (ACUTE ONLY): 23 min   Charges:   PT Evaluation $Initial PT Evaluation Tier I: 1 Procedure PT Treatments $Gait Training: 8-22 mins   PT G Codes:   PT G-Codes **NOT FOR INPATIENT CLASS** Functional Assessment Tool Used: Clinical judgement Functional Limitation: Mobility: Walking and moving around Mobility: Walking and Moving Around Current Status ([id number]): At least 20 percent but less than 40 percent impaired, limited or restricted Mobility: Walking and Moving Around Goal Status [phone]([id number]): At least 20 percent but less than 40 percent impaired, limited or restricted    Conni SlipperKirkman, Maydell Knoebel 03/12/2015, 11:23 AM   Conni SlipperLaura Malli Falotico, PT, DPT Acute Rehabilitation Services Pager: [phone]

## 2015-03-15 NOTE — Discharge Summary (Signed)
Physician Discharge Summary  Patient ID: Cassidy Green MRN: [id number] DOB/AGE: 60-Sep-1957 60 y.o.  Admit date: 03/11/2015 Discharge date: 03/15/2015  Admission Diagnoses:  Synovial Cyst of the Lumbar   Discharge Diagnoses:  Active Problems:   Spinal stenosis at L4-L5 level   Back pain   Past Medical History  Diagnosis Date  . Hypertension   . Hyperlipidemia   . Arthritis   . GERD (gastroesophageal reflux disease)   . Anemia   . Anxiety   . Diabetes mellitus without complication (HCC)     type II  . History of pneumonia   . History of bronchitis   . Headache   . Joint inflammation   . Female bladder prolapse     Surgeries: Procedure(s): Lumbar Four-Five DECOMPRESSION  AND REMOVAL SYNOVAL CYST on 03/11/2015   Consultants (if any):    Discharged Condition: Improved  Hospital Course: Cassidy Green is an 59 y.o. female who was admitted 03/11/2015 with a diagnosis of LBP and right leg pain and went to the operating room on 03/11/2015 and underwent the above named procedures.  Pt was inpatient and was discharged on 03/12/15.  Her hospital course was uneventful.  She was given perioperative antibiotics:  Anti-infectives    Start     Dose/Rate Route Frequency Ordered Stop   03/11/15 1800  ceFAZolin (ANCEF) IVPB 1 g/50 mL premix     1 g 100 mL/hr over 30 Minutes Intravenous Every 8 hours 03/11/15 1531 03/12/15 0216   03/11/15 0930  ceFAZolin (ANCEF) IVPB 2 g/50 mL premix     2 g 100 mL/hr over 30 Minutes Intravenous To [facility] 03/10/15 1309 03/11/15 1048    .  She was given sequential compression devices, early ambulation, and TED for DVT prophylaxis.  She benefited maximally from the hospital stay and there were no complications.    Recent vital signs:  Filed Vitals:   03/12/15 1214  BP: 144/72  Pulse: 99  Temp: 98 F (36.7 C)  Resp: 18    Recent laboratory studies:  Lab Results  Component Value Date   HGB 9.2* 03/11/2015   HGB 10.7*  03/04/2015   HGB 8.6* 09/04/2014   Lab Results  Component Value Date   WBC 8.0 03/04/2015   PLT 215 03/04/2015   Lab Results  Component Value Date   INR 1.05 07/21/2014   Lab Results  Component Value Date   NA 142 03/11/2015   K 4.2 03/11/2015   CL 113* 03/04/2015   CO2 19* 03/04/2015   BUN 26* 03/04/2015   CREATININE 1.58* 03/04/2015   GLUCOSE 84 03/11/2015    Discharge Medications:     Medication List    STOP taking these medications        ALEVE 220 MG tablet  Generic drug:  naproxen sodium     HYDROcodone-acetaminophen 5-325 MG tablet  Commonly known as:  NORCO      TAKE these medications        ALPRAZolam 0.25 MG tablet  Commonly known as:  XANAX  Take 0.25 mg by mouth daily as needed for anxiety.     cetirizine-pseudoephedrine 5-120 MG tablet  Commonly known as:  ZYRTEC-D  Take 1 tablet by mouth 2 (two) times daily.     docusate sodium 100 MG capsule  Commonly known as:  COLACE  Take 1 capsule (100 mg total) by mouth 3 (three) times daily as needed for mild constipation.     ezetimibe 10 MG tablet  Commonly  known as:  ZETIA  Take 10 mg by mouth at bedtime.     fluticasone 50 MCG/ACT nasal spray  Commonly known as:  FLONASE  Place 1 spray into both nostrils daily.     folic acid 1 MG tablet  Commonly known as:  FOLVITE  Take 1 mg by mouth at bedtime.     HEMOCYTE PO  Take 1 tablet by mouth daily.     lisinopril 20 MG tablet  Commonly known as:  PRINIVIL,ZESTRIL  Take 20 mg by mouth at bedtime.     methocarbamol 500 MG tablet  Commonly known as:  ROBAXIN  Take 1 tablet (500 mg total) by mouth 3 (three) times daily as needed for muscle spasms.     omeprazole 40 MG capsule  Commonly known as:  PRILOSEC  Take 40 mg by mouth at bedtime.     ondansetron 4 MG tablet  Commonly known as:  ZOFRAN  Take 1 tablet (4 mg total) by mouth every 8 (eight) hours as needed for nausea or vomiting.     oxyCODONE-acetaminophen 10-325 MG tablet   Commonly known as:  PERCOCET  Take 1 tablet by mouth every 4 (four) hours as needed for pain.     sitaGLIPtin-metformin 50-1000 MG tablet  Commonly known as:  JANUMET  Take 1 tablet by mouth at bedtime.     sodium chloride 0.65 % Soln nasal spray  Commonly known as:  OCEAN  Place 1 spray into both nostrils at bedtime.     Vitamin D 2000 UNITS Caps  Take 1 capsule by mouth at bedtime.        Diagnostic Studies: Dg Lumbar Spine 2-3 Views  03/11/2015  CLINICAL DATA:  Intraoperative localization for lumbar decompression at L4-5 EXAM: LUMBAR SPINE - 2-3 VIEW COMPARISON:  None. FINDINGS: Two intraoperative cross-table lateral radiographs of the lumbar spine are provided. Image 1 demonstrates 2 metallic needles. The more superior metallic needle projects just posterior to the L5 pedicle. The more inferior needle projects over the S1 spinous process. Image 2 demonstrates a metallic probe with the tip along the superior margin of the L5 pedicle. IMPRESSION: Intraoperative localization films as detailed above. The films have been enumerated. Electronically Signed   By: Elige KoHetal  Patel   On: 03/11/2015 11:43    Disposition: 01-Home or Self Care        Follow-up Information    Follow up with Alvy BealBROOKS,DAHARI D, MD In 2 weeks.   Specialty:  Orthopedic Surgery   Why:  If symptoms worsen, For suture removal, For wound re-check   Contact information:   [address] [place] [id number] [phone]        Signed: Kirt BoysMayo, Carmen Christina 03/15/2015, 1:19 PM

## 2016-06-11 DIAGNOSIS — M549 Dorsalgia, unspecified: Secondary | ICD-10-CM | POA: Diagnosis not present

## 2016-06-11 DIAGNOSIS — K859 Acute pancreatitis without necrosis or infection, unspecified: Secondary | ICD-10-CM

## 2016-06-11 DIAGNOSIS — N132 Hydronephrosis with renal and ureteral calculous obstruction: Secondary | ICD-10-CM | POA: Diagnosis not present

## 2016-06-11 DIAGNOSIS — E119 Type 2 diabetes mellitus without complications: Secondary | ICD-10-CM

## 2016-06-11 DIAGNOSIS — N179 Acute kidney failure, unspecified: Secondary | ICD-10-CM

## 2016-06-12 DIAGNOSIS — M549 Dorsalgia, unspecified: Secondary | ICD-10-CM | POA: Diagnosis not present

## 2016-06-12 DIAGNOSIS — K859 Acute pancreatitis without necrosis or infection, unspecified: Secondary | ICD-10-CM | POA: Diagnosis not present

## 2016-06-12 DIAGNOSIS — N132 Hydronephrosis with renal and ureteral calculous obstruction: Secondary | ICD-10-CM | POA: Diagnosis not present

## 2016-06-12 DIAGNOSIS — N179 Acute kidney failure, unspecified: Secondary | ICD-10-CM | POA: Diagnosis not present

## 2016-06-14 DIAGNOSIS — N132 Hydronephrosis with renal and ureteral calculous obstruction: Secondary | ICD-10-CM | POA: Diagnosis not present

## 2016-06-14 DIAGNOSIS — N179 Acute kidney failure, unspecified: Secondary | ICD-10-CM | POA: Diagnosis not present

## 2018-06-10 DIAGNOSIS — N183 Chronic kidney disease, stage 3 (moderate): Secondary | ICD-10-CM

## 2018-06-10 DIAGNOSIS — R079 Chest pain, unspecified: Secondary | ICD-10-CM | POA: Diagnosis not present

## 2018-06-10 DIAGNOSIS — I10 Essential (primary) hypertension: Secondary | ICD-10-CM

## 2018-06-10 DIAGNOSIS — E119 Type 2 diabetes mellitus without complications: Secondary | ICD-10-CM

## 2018-06-10 DIAGNOSIS — N179 Acute kidney failure, unspecified: Secondary | ICD-10-CM | POA: Diagnosis not present

## 2018-06-10 DIAGNOSIS — Z87448 Personal history of other diseases of urinary system: Secondary | ICD-10-CM

## 2018-06-10 DIAGNOSIS — E785 Hyperlipidemia, unspecified: Secondary | ICD-10-CM

## 2018-06-11 ENCOUNTER — Inpatient Hospital Stay: Admission: AD | Admit: 2018-06-11 | Payer: Self-pay | Source: Other Acute Inpatient Hospital | Admitting: Student

## 2018-06-11 DIAGNOSIS — K859 Acute pancreatitis without necrosis or infection, unspecified: Secondary | ICD-10-CM

## 2018-06-11 DIAGNOSIS — N179 Acute kidney failure, unspecified: Secondary | ICD-10-CM | POA: Diagnosis not present

## 2018-06-11 DIAGNOSIS — E785 Hyperlipidemia, unspecified: Secondary | ICD-10-CM | POA: Diagnosis not present

## 2018-06-11 DIAGNOSIS — E119 Type 2 diabetes mellitus without complications: Secondary | ICD-10-CM | POA: Diagnosis not present

## 2018-06-11 DIAGNOSIS — N183 Chronic kidney disease, stage 3 (moderate): Secondary | ICD-10-CM | POA: Diagnosis not present

## 2018-06-11 DIAGNOSIS — R079 Chest pain, unspecified: Secondary | ICD-10-CM

## 2018-06-11 DIAGNOSIS — E781 Pure hyperglyceridemia: Secondary | ICD-10-CM

## 2018-06-12 ENCOUNTER — Other Ambulatory Visit: Payer: Self-pay

## 2018-06-12 ENCOUNTER — Inpatient Hospital Stay (HOSPITAL_COMMUNITY): Payer: Managed Care, Other (non HMO)

## 2018-06-12 ENCOUNTER — Encounter (HOSPITAL_COMMUNITY): Payer: Self-pay | Admitting: General Practice

## 2018-06-12 ENCOUNTER — Inpatient Hospital Stay (HOSPITAL_COMMUNITY)
Admission: AD | Disposition: A | Discharge status: Home / Self Care | Payer: Self-pay | Source: Other Acute Inpatient Hospital | Attending: Internal Medicine

## 2018-06-12 ENCOUNTER — Inpatient Hospital Stay (HOSPITAL_COMMUNITY)
Admission: AD | Admit: 2018-06-12 | Discharge: 2018-06-21 | DRG: 234 | Disposition: A | Discharge status: Home / Self Care | Payer: Managed Care, Other (non HMO) | Source: Other Acute Inpatient Hospital | Attending: Internal Medicine | Admitting: Internal Medicine

## 2018-06-12 DIAGNOSIS — I959 Hypotension, unspecified: Secondary | ICD-10-CM | POA: Diagnosis not present

## 2018-06-12 DIAGNOSIS — E1165 Type 2 diabetes mellitus with hyperglycemia: Secondary | ICD-10-CM | POA: Diagnosis not present

## 2018-06-12 DIAGNOSIS — I2582 Chronic total occlusion of coronary artery: Secondary | ICD-10-CM | POA: Diagnosis present

## 2018-06-12 DIAGNOSIS — I200 Unstable angina: Secondary | ICD-10-CM | POA: Diagnosis present

## 2018-06-12 DIAGNOSIS — R9439 Abnormal result of other cardiovascular function study: Secondary | ICD-10-CM | POA: Diagnosis not present

## 2018-06-12 DIAGNOSIS — Z87891 Personal history of nicotine dependence: Secondary | ICD-10-CM

## 2018-06-12 DIAGNOSIS — E1122 Type 2 diabetes mellitus with diabetic chronic kidney disease: Secondary | ICD-10-CM | POA: Diagnosis present

## 2018-06-12 DIAGNOSIS — Z79899 Other long term (current) drug therapy: Secondary | ICD-10-CM

## 2018-06-12 DIAGNOSIS — I700 Atherosclerosis of aorta: Secondary | ICD-10-CM | POA: Diagnosis present

## 2018-06-12 DIAGNOSIS — I2510 Atherosclerotic heart disease of native coronary artery without angina pectoris: Secondary | ICD-10-CM

## 2018-06-12 DIAGNOSIS — Z01810 Encounter for preprocedural cardiovascular examination: Secondary | ICD-10-CM | POA: Diagnosis not present

## 2018-06-12 DIAGNOSIS — K219 Gastro-esophageal reflux disease without esophagitis: Secondary | ICD-10-CM | POA: Diagnosis present

## 2018-06-12 DIAGNOSIS — Z8719 Personal history of other diseases of the digestive system: Secondary | ICD-10-CM

## 2018-06-12 DIAGNOSIS — R072 Precordial pain: Secondary | ICD-10-CM | POA: Diagnosis not present

## 2018-06-12 DIAGNOSIS — Z7984 Long term (current) use of oral hypoglycemic drugs: Secondary | ICD-10-CM

## 2018-06-12 DIAGNOSIS — E1169 Type 2 diabetes mellitus with other specified complication: Secondary | ICD-10-CM | POA: Diagnosis present

## 2018-06-12 DIAGNOSIS — M1990 Unspecified osteoarthritis, unspecified site: Secondary | ICD-10-CM | POA: Diagnosis present

## 2018-06-12 DIAGNOSIS — R42 Dizziness and giddiness: Secondary | ICD-10-CM | POA: Diagnosis not present

## 2018-06-12 DIAGNOSIS — R51 Headache: Secondary | ICD-10-CM | POA: Diagnosis present

## 2018-06-12 DIAGNOSIS — N183 Chronic kidney disease, stage 3 unspecified: Secondary | ICD-10-CM

## 2018-06-12 DIAGNOSIS — E785 Hyperlipidemia, unspecified: Secondary | ICD-10-CM | POA: Diagnosis present

## 2018-06-12 DIAGNOSIS — R609 Edema, unspecified: Secondary | ICD-10-CM | POA: Diagnosis not present

## 2018-06-12 DIAGNOSIS — I10 Essential (primary) hypertension: Secondary | ICD-10-CM | POA: Diagnosis not present

## 2018-06-12 DIAGNOSIS — Z888 Allergy status to other drugs, medicaments and biological substances status: Secondary | ICD-10-CM

## 2018-06-12 DIAGNOSIS — I1310 Hypertensive heart and chronic kidney disease without heart failure, with stage 1 through stage 4 chronic kidney disease, or unspecified chronic kidney disease: Secondary | ICD-10-CM | POA: Diagnosis present

## 2018-06-12 DIAGNOSIS — R000 Tachycardia, unspecified: Secondary | ICD-10-CM | POA: Diagnosis not present

## 2018-06-12 DIAGNOSIS — I4892 Unspecified atrial flutter: Secondary | ICD-10-CM | POA: Diagnosis not present

## 2018-06-12 DIAGNOSIS — Z951 Presence of aortocoronary bypass graft: Secondary | ICD-10-CM | POA: Diagnosis not present

## 2018-06-12 DIAGNOSIS — E119 Type 2 diabetes mellitus without complications: Secondary | ICD-10-CM | POA: Diagnosis not present

## 2018-06-12 DIAGNOSIS — Z7951 Long term (current) use of inhaled steroids: Secondary | ICD-10-CM

## 2018-06-12 DIAGNOSIS — J90 Pleural effusion, not elsewhere classified: Secondary | ICD-10-CM | POA: Diagnosis not present

## 2018-06-12 DIAGNOSIS — D649 Anemia, unspecified: Secondary | ICD-10-CM | POA: Diagnosis present

## 2018-06-12 DIAGNOSIS — J9811 Atelectasis: Secondary | ICD-10-CM | POA: Diagnosis not present

## 2018-06-12 DIAGNOSIS — M4800 Spinal stenosis, site unspecified: Secondary | ICD-10-CM | POA: Diagnosis present

## 2018-06-12 DIAGNOSIS — I25110 Atherosclerotic heart disease of native coronary artery with unstable angina pectoris: Principal | ICD-10-CM | POA: Diagnosis present

## 2018-06-12 DIAGNOSIS — I483 Typical atrial flutter: Secondary | ICD-10-CM | POA: Diagnosis not present

## 2018-06-12 DIAGNOSIS — Z96642 Presence of left artificial hip joint: Secondary | ICD-10-CM | POA: Diagnosis present

## 2018-06-12 DIAGNOSIS — E781 Pure hyperglyceridemia: Secondary | ICD-10-CM | POA: Diagnosis not present

## 2018-06-12 DIAGNOSIS — R079 Chest pain, unspecified: Secondary | ICD-10-CM

## 2018-06-12 DIAGNOSIS — I4891 Unspecified atrial fibrillation: Secondary | ICD-10-CM | POA: Diagnosis not present

## 2018-06-12 HISTORY — PX: ULTRASOUND GUIDANCE FOR VASCULAR ACCESS: SHX6516

## 2018-06-12 HISTORY — PX: LEFT HEART CATH AND CORONARY ANGIOGRAPHY: CATH118249

## 2018-06-12 LAB — HEMOGLOBIN A1C
Hgb A1c MFr Bld: 7.6 % — ABNORMAL HIGH (ref 4.8–5.6)
MEAN PLASMA GLUCOSE: 171.42 mg/dL

## 2018-06-12 LAB — PROTIME-INR
INR: 1.16
Prothrombin Time: 14.7 seconds (ref 11.4–15.2)

## 2018-06-12 LAB — CBC
HCT: 33.6 % — ABNORMAL LOW (ref 36.0–46.0)
Hemoglobin: 10.1 g/dL — ABNORMAL LOW (ref 12.0–15.0)
MCH: 25.2 pg — ABNORMAL LOW (ref 26.0–34.0)
MCHC: 30.1 g/dL (ref 30.0–36.0)
MCV: 83.8 fL (ref 80.0–100.0)
NRBC: 0 % (ref 0.0–0.2)
Platelets: 327 10*3/uL (ref 150–400)
RBC: 4.01 MIL/uL (ref 3.87–5.11)
RDW: 15.3 % (ref 11.5–15.5)
WBC: 7.8 10*3/uL (ref 4.0–10.5)

## 2018-06-12 LAB — LIPID PANEL
CHOL/HDL RATIO: 13 ratio
CHOLESTEROL: 351 mg/dL — AB (ref 0–200)
HDL: 27 mg/dL — AB (ref >40)
LDL Cholesterol: UNDETERMINED mg/dL (ref 0–99)
Triglycerides: 656 mg/dL — ABNORMAL HIGH (ref <150)
VLDL: UNDETERMINED mg/dL (ref 0–40)

## 2018-06-12 LAB — BASIC METABOLIC PANEL
Anion gap: 9 (ref 5–15)
BUN: 23 mg/dL (ref 8–23)
CALCIUM: 9.4 mg/dL (ref 8.9–10.3)
CO2: 20 mmol/L — ABNORMAL LOW (ref 22–32)
CREATININE: 1.27 mg/dL — AB (ref 0.44–1.00)
Chloride: 110 mmol/L (ref 98–111)
GFR calc Af Amer: 52 mL/min — ABNORMAL LOW (ref >60)
GFR calc non Af Amer: 45 mL/min — ABNORMAL LOW (ref >60)
Glucose, Bld: 155 mg/dL — ABNORMAL HIGH (ref 70–99)
Potassium: 4.2 mmol/L (ref 3.5–5.1)
Sodium: 139 mmol/L (ref 135–145)

## 2018-06-12 LAB — HIV ANTIBODY (ROUTINE TESTING W REFLEX): HIV Screen 4th Generation wRfx: NONREACTIVE

## 2018-06-12 LAB — ECHOCARDIOGRAM COMPLETE
Height: 63 in
Weight: 2299.2 oz

## 2018-06-12 LAB — GLUCOSE, CAPILLARY
GLUCOSE-CAPILLARY: 116 mg/dL — AB (ref 70–99)
GLUCOSE-CAPILLARY: 128 mg/dL — AB (ref 70–99)
Glucose-Capillary: 119 mg/dL — ABNORMAL HIGH (ref 70–99)
Glucose-Capillary: 103 mg/dL — ABNORMAL HIGH (ref 70–99)
Glucose-Capillary: 271 mg/dL — ABNORMAL HIGH (ref 70–99)

## 2018-06-12 LAB — POCT ACTIVATED CLOTTING TIME: Activated Clotting Time: 98 seconds

## 2018-06-12 SURGERY — LEFT HEART CATH AND CORONARY ANGIOGRAPHY
Anesthesia: LOCAL

## 2018-06-12 MED ORDER — HEPARIN SODIUM (PORCINE) 5000 UNIT/ML IJ SOLN
5000.0000 [IU] | Freq: Three times a day (TID) | INTRAMUSCULAR | Status: DC
  Administered 2018-06-12: 5000 [IU] via SUBCUTANEOUS
  Filled 2018-06-12: qty 1

## 2018-06-12 MED ORDER — IOHEXOL 350 MG/ML SOLN
INTRAVENOUS | Status: DC | PRN
  Administered 2018-06-12: 65 mL via INTRA_ARTERIAL

## 2018-06-12 MED ORDER — VITAMIN D 50 MCG (2000 UT) PO CAPS: 1.0000 | ORAL_CAPSULE | Freq: Every day | ORAL | Status: DC

## 2018-06-12 MED ORDER — SODIUM CHLORIDE 0.9 % WEIGHT BASED INFUSION
1.0000 mL/kg/h | INTRAVENOUS | Status: DC
  Administered 2018-06-12: 1 mL/kg/h via INTRAVENOUS

## 2018-06-12 MED ORDER — SODIUM CHLORIDE 0.9 % IV SOLN: 250.0000 mL | INTRAVENOUS | Status: DC | PRN

## 2018-06-12 MED ORDER — FENTANYL CITRATE (PF) 100 MCG/2ML IJ SOLN
INTRAMUSCULAR | Status: AC
  Filled 2018-06-12: qty 2

## 2018-06-12 MED ORDER — EZETIMIBE 10 MG PO TABS
10.0000 mg | ORAL_TABLET | Freq: Every day | ORAL | Status: DC
  Administered 2018-06-13 – 2018-06-21 (×8): 10 mg via ORAL
  Filled 2018-06-12 (×9): qty 1

## 2018-06-12 MED ORDER — ASPIRIN EC 81 MG PO TBEC
81.0000 mg | DELAYED_RELEASE_TABLET | Freq: Every day | ORAL | Status: DC
  Administered 2018-06-12 – 2018-06-13 (×2): 81 mg via ORAL
  Filled 2018-06-12 (×2): qty 1

## 2018-06-12 MED ORDER — INSULIN ASPART 100 UNIT/ML ~~LOC~~ SOLN: 0.0000 [IU] | Freq: Three times a day (TID) | SUBCUTANEOUS | Status: DC

## 2018-06-12 MED ORDER — LISINOPRIL 20 MG PO TABS: 20.0000 mg | ORAL_TABLET | Freq: Every day | ORAL | Status: DC

## 2018-06-12 MED ORDER — OMEGA-3-ACID ETHYL ESTERS 1 G PO CAPS
2.0000 g | ORAL_CAPSULE | Freq: Two times a day (BID) | ORAL | Status: DC
Start: 2018-06-12 — End: 2018-06-21
  Administered 2018-06-12 – 2018-06-21 (×18): 2 g via ORAL
  Filled 2018-06-12 (×18): qty 2

## 2018-06-12 MED ORDER — SODIUM CHLORIDE 0.9% FLUSH
3.0000 mL | Freq: Two times a day (BID) | INTRAVENOUS | Status: DC
  Administered 2018-06-13: 3 mL via INTRAVENOUS

## 2018-06-12 MED ORDER — FOLIC ACID 1 MG PO TABS: 1.0000 mg | ORAL_TABLET | Freq: Every day | ORAL | Status: DC

## 2018-06-12 MED ORDER — MIDAZOLAM HCL 2 MG/2ML IJ SOLN
INTRAMUSCULAR | Status: DC | PRN
  Administered 2018-06-12: 2 mg via INTRAVENOUS

## 2018-06-12 MED ORDER — HEPARIN (PORCINE) IN NACL 1000-0.9 UT/500ML-% IV SOLN
INTRAVENOUS | Status: DC | PRN
  Administered 2018-06-12 (×2): 500 mL

## 2018-06-12 MED ORDER — FENTANYL CITRATE (PF) 100 MCG/2ML IJ SOLN
INTRAMUSCULAR | Status: DC | PRN
  Administered 2018-06-12: 25 ug via INTRAVENOUS

## 2018-06-12 MED ORDER — VERAPAMIL HCL 2.5 MG/ML IV SOLN
INTRAVENOUS | Status: AC
  Filled 2018-06-12: qty 2

## 2018-06-12 MED ORDER — SODIUM CHLORIDE 0.9 % IV SOLN
INTRAVENOUS | Status: DC
Start: 2018-06-12 — End: 2018-06-14
  Administered 2018-06-12: 02:00:00 via INTRAVENOUS

## 2018-06-12 MED ORDER — ONDANSETRON HCL 4 MG/2ML IJ SOLN: 4.0000 mg | Freq: Four times a day (QID) | INTRAMUSCULAR | Status: DC | PRN

## 2018-06-12 MED ORDER — DOCUSATE SODIUM 100 MG PO CAPS: 100.0000 mg | ORAL_CAPSULE | Freq: Three times a day (TID) | ORAL | Status: DC | PRN

## 2018-06-12 MED ORDER — MIDAZOLAM HCL 2 MG/2ML IJ SOLN
INTRAMUSCULAR | Status: AC
  Filled 2018-06-12: qty 2

## 2018-06-12 MED ORDER — NITROGLYCERIN 0.4 MG SL SUBL
0.4000 mg | SUBLINGUAL_TABLET | SUBLINGUAL | Status: DC | PRN
  Administered 2018-06-12: 0.4 mg via SUBLINGUAL
  Filled 2018-06-12: qty 1

## 2018-06-12 MED ORDER — SODIUM CHLORIDE 0.9 % WEIGHT BASED INFUSION: 3.0000 mL/kg/h | INTRAVENOUS | Status: DC

## 2018-06-12 MED ORDER — METOPROLOL TARTRATE 25 MG PO TABS
25.0000 mg | ORAL_TABLET | Freq: Two times a day (BID) | ORAL | Status: DC
  Administered 2018-06-12 – 2018-06-14 (×6): 25 mg via ORAL
  Filled 2018-06-12 (×6): qty 1

## 2018-06-12 MED ORDER — ONDANSETRON HCL 4 MG PO TABS: 4.0000 mg | ORAL_TABLET | Freq: Three times a day (TID) | ORAL | Status: DC | PRN

## 2018-06-12 MED ORDER — ATORVASTATIN CALCIUM 40 MG PO TABS
40.0000 mg | ORAL_TABLET | Freq: Every day | ORAL | Status: DC
  Filled 2018-06-12: qty 1

## 2018-06-12 MED ORDER — SALINE SPRAY 0.65 % NA SOLN: 1.0000 | Freq: Every day | NASAL | Status: DC

## 2018-06-12 MED ORDER — FOLIC ACID 1 MG PO TABS
1.0000 mg | ORAL_TABLET | Freq: Every day | ORAL | Status: DC
  Administered 2018-06-12 – 2018-06-21 (×9): 1 mg via ORAL
  Filled 2018-06-12 (×9): qty 1

## 2018-06-12 MED ORDER — SODIUM CHLORIDE 0.9% FLUSH: 3.0000 mL | INTRAVENOUS | Status: DC | PRN

## 2018-06-12 MED ORDER — SODIUM CHLORIDE 0.9 % WEIGHT BASED INFUSION: 1.0000 mL/kg/h | INTRAVENOUS | Status: DC

## 2018-06-12 MED ORDER — INSULIN ASPART 100 UNIT/ML ~~LOC~~ SOLN
0.0000 [IU] | Freq: Three times a day (TID) | SUBCUTANEOUS | Status: DC
  Administered 2018-06-13 (×2): 1 [IU] via SUBCUTANEOUS
  Administered 2018-06-13: 2 [IU] via SUBCUTANEOUS
  Administered 2018-06-14: 1 [IU] via SUBCUTANEOUS

## 2018-06-12 MED ORDER — FERROUS FUMARATE 324 (106 FE) MG PO TABS: 106.0000 mg | ORAL_TABLET | Freq: Every day | ORAL | Status: DC

## 2018-06-12 MED ORDER — ACETAMINOPHEN 325 MG PO TABS
650.0000 mg | ORAL_TABLET | ORAL | Status: DC | PRN
  Administered 2018-06-12 (×2): 650 mg via ORAL
  Filled 2018-06-12 (×2): qty 2

## 2018-06-12 MED ORDER — SODIUM CHLORIDE 0.9 % IV SOLN: INTRAVENOUS | Status: AC

## 2018-06-12 MED ORDER — SODIUM CHLORIDE 0.9 % WEIGHT BASED INFUSION: 3.0000 mL/kg/h | INTRAVENOUS | Status: AC

## 2018-06-12 MED ORDER — METHOCARBAMOL 500 MG PO TABS
500.0000 mg | ORAL_TABLET | Freq: Three times a day (TID) | ORAL | Status: DC | PRN
  Administered 2018-06-12: 500 mg via ORAL
  Filled 2018-06-12: qty 1

## 2018-06-12 MED ORDER — ALPRAZOLAM 0.25 MG PO TABS: 0.2500 mg | ORAL_TABLET | Freq: Every day | ORAL | Status: DC | PRN

## 2018-06-12 MED ORDER — ACETAMINOPHEN 325 MG PO TABS: 650.0000 mg | ORAL_TABLET | ORAL | Status: DC | PRN

## 2018-06-12 MED ORDER — ASPIRIN 81 MG PO CHEW: 81.0000 mg | CHEWABLE_TABLET | Freq: Every day | ORAL | Status: DC

## 2018-06-12 MED ORDER — HEPARIN (PORCINE) 25000 UT/250ML-% IV SOLN
900.0000 [IU]/h | INTRAVENOUS | Status: DC
  Administered 2018-06-13 – 2018-06-14 (×2): 900 [IU]/h via INTRAVENOUS
  Filled 2018-06-12 (×2): qty 250

## 2018-06-12 MED ORDER — PANTOPRAZOLE SODIUM 40 MG PO TBEC
40.0000 mg | DELAYED_RELEASE_TABLET | Freq: Every day | ORAL | Status: DC
  Administered 2018-06-12 – 2018-06-13 (×2): 40 mg via ORAL
  Filled 2018-06-12 (×2): qty 1

## 2018-06-12 MED ORDER — LIDOCAINE HCL (PF) 1 % IJ SOLN
INTRAMUSCULAR | Status: DC | PRN
  Administered 2018-06-12: 2 mL
  Administered 2018-06-12: 15 mL

## 2018-06-12 MED ORDER — SODIUM CHLORIDE 0.9% FLUSH: 3.0000 mL | Freq: Two times a day (BID) | INTRAVENOUS | Status: DC

## 2018-06-12 MED ORDER — LORATADINE 10 MG PO TABS
10.0000 mg | ORAL_TABLET | Freq: Every day | ORAL | Status: DC
  Administered 2018-06-12 – 2018-06-21 (×9): 10 mg via ORAL
  Filled 2018-06-12 (×9): qty 1

## 2018-06-12 MED ORDER — FLUTICASONE PROPIONATE 50 MCG/ACT NA SUSP
1.0000 | Freq: Every day | NASAL | Status: DC
  Administered 2018-06-12 – 2018-06-21 (×10): 1 via NASAL
  Filled 2018-06-12 (×3): qty 16

## 2018-06-12 MED ORDER — LIDOCAINE HCL (PF) 1 % IJ SOLN
INTRAMUSCULAR | Status: AC
  Filled 2018-06-12: qty 30

## 2018-06-12 SURGICAL SUPPLY — 12 items
CATH INFINITI MULTIPACK ST 5F (CATHETERS) ×1 IMPLANT
GLIDESHEATH SLEND SS 6F .021 (SHEATH) ×1 IMPLANT
GUIDEWIRE INQWIRE 1.5J.035X260 (WIRE) IMPLANT
INQWIRE 1.5J .035X260CM (WIRE) ×3
KIT HEART LEFT (KITS) ×3 IMPLANT
PACK CARDIAC CATHETERIZATION (CUSTOM PROCEDURE TRAY) ×3 IMPLANT
SHEATH PINNACLE 5F 10CM (SHEATH) ×1 IMPLANT
SHEATH PROBE COVER 6X72 (BAG) ×1 IMPLANT
TRANSDUCER W/STOPCOCK (MISCELLANEOUS) ×3 IMPLANT
TUBING CIL FLEX 10 FLL-RA (TUBING) ×3 IMPLANT
WIRE EMERALD 3MM-J .035X150CM (WIRE) ×1 IMPLANT
WIRE MICROINTRODUCER 60CM (WIRE) ×1 IMPLANT

## 2018-06-12 NOTE — Progress Notes (Signed)
  Echocardiogram 2D Echocardiogram has been performed.  Cassidy Green 06/12/2018, 1:12 PM

## 2018-06-12 NOTE — Progress Notes (Signed)
Site area: rt groin fa sheath Site Prior to Removal:  Level 0 Pressure Applied For: 20 minutes Manual:   yes Patient Status During Pull:  stable Post Pull Site:  Level 0 Post Pull Instructions Given:  yes Post Pull Pulses Present: rt dp palpable Dressing Applied:  Gauze and tegaderm Bedrest begins @ 1735 Comments:

## 2018-06-12 NOTE — Progress Notes (Signed)
ANTICOAGULATION CONSULT NOTE - Initial Consult  Pharmacy Consult for heparin Indication: chest pain/ACS  Allergies  Allergen Reactions  . Statins     " I cant recall the name of the statin, but I had joint pain with one of the statins "    Patient Measurements: Height: 5\' 3"  (160 cm) Weight: 143 lb 11.2 oz (65.2 kg) IBW/kg (Calculated) : 52.4 Heparin Dosing Weight: 65kg  Vital Signs: Temp: 98.2 F (36.8 C) (01/15 1816) Temp Source: Oral (01/15 1816) BP: 157/95 (01/15 1816) Pulse Rate: 80 (01/15 1816)  Labs: Recent Labs    06/12/18 0443  HGB 10.1*  HCT 33.6*  PLT 327  LABPROT 14.7  INR 1.16  CREATININE 1.27*    Estimated Creatinine Clearance: 41.2 mL/min (A) (by C-G formula based on SCr of 1.27 mg/dL (H)).   Medical History: Past Medical History:  Diagnosis Date  . Anemia   . Anxiety   . Arthritis   . Diabetes mellitus without complication (HCC)    type II  . Female bladder prolapse   . GERD (gastroesophageal reflux disease)   . Headache   . History of bronchitis   . History of pneumonia   . Hyperlipidemia   . Hypertension   . Joint inflammation    Assessment: 62 year old female s/p cath this afternoon found to have multivessel CAD. New orders received to start IV heparin and await surgical consultation. Hgb 10 prior to cath, will follow.   Goal of Therapy:  Heparin level 0.3-0.7 units/ml Monitor platelets by anticoagulation protocol: Yes   Plan:  Start heparin infusion at 900 units/hr Check anti-Xa level in 6 hours and daily while on heparin Continue to monitor H&H and platelets   Sheppard Coil PharmD., BCPS Clinical Pharmacist 06/12/2018 6:52 PM

## 2018-06-12 NOTE — Progress Notes (Signed)
Nutrition Brief Note  Patient identified on the Malnutrition Screening Tool (MST) Report. Patient reports no weight loss PTA. She has been eating well at home with no nutritional issues. Usual weight is 143-144 lbs. Nutrition focused physical exam completed.  No muscle or subcutaneous fat depletion noticed.   Wt Readings from Last 15 Encounters:  06/12/18 65.2 kg  03/11/15 61.2 kg  03/04/15 61.6 kg  09/04/14 61.2 kg  07/30/14 61.2 kg  07/21/14 61.2 kg    Body mass index is 25.46 kg/m. Patient meets criteria for normal weight based on current BMI.   Current diet order is NPO for cardiac cath today. Labs and medications reviewed.   No nutrition interventions warranted at this time. If nutrition issues arise, please consult RD.   Joaquin Courts, RD, LDN, CNSC Pager [phone] After Hours Pager [phone]

## 2018-06-12 NOTE — Progress Notes (Addendum)
Progress Note  Patient Name: Cassidy Green Date of Encounter: 06/12/2018  Primary Cardiologist: New to [facility] (Dr. Anne Green).  Subjective   Patient transferred from [facility] overnight for cardiac catheterization. No chest pain or shortness of breath at this time. Only complaint is a mild headache.  Inpatient Medications    Scheduled Meds: . aspirin EC  81 mg Oral Daily  . atorvastatin  40 mg Oral q1800  . ezetimibe  10 mg Oral Daily  . fluticasone  1 spray Each Nare Daily  . folic acid  1 mg Oral Daily  . heparin  5,000 Units Subcutaneous Q8H  . loratadine  10 mg Oral Daily  . metoprolol tartrate  25 mg Oral BID  . omega-3 acid ethyl esters  2 g Oral BID  . pantoprazole  40 mg Oral Daily   Continuous Infusions: . sodium chloride 75 mL/hr at 06/12/18 0300   PRN Meds: acetaminophen, docusate sodium, methocarbamol, nitroGLYCERIN, ondansetron (ZOFRAN) IV, ondansetron   Vital Signs    Vitals:   06/12/18 0033 06/12/18 0340  BP: (!) 141/93 (!) 144/79  Pulse: 81 73  Resp:  16  Temp: 98.2 F (36.8 C)   TempSrc: Oral   SpO2: 100% 100%  Weight: 65.2 kg   Height: 5\' 3"  (1.6 m)     Intake/Output Summary (Last 24 hours) at 06/12/2018 0388 Last data filed at 06/12/2018 0300 Gross per 24 hour  Intake 66.85 ml  Output -  Net 66.85 ml   Last 3 Weights 06/12/2018 03/11/2015 03/04/2015  Weight (lbs) 143 lb 11.2 oz 135 lb 135 lb 12.8 oz  Weight (kg) 65.182 kg 61.236 kg 61.598 kg      Telemetry    Sinus rhythm with heart rates ranging from 70's to 110's. - Personally Reviewed  ECG    Normal sinus rhythm, 72 bpm, with no acute ischemic changes. - Personally Reviewed  Physical Exam   GEN: African-American female resting comfortably. Alert and in no acute distress.   Neck: Supple. Cardiac: RRR. No murmurs, rubs, or gallops.  Respiratory: Clear to auscultation bilaterally. No wheezes, rhonchi, or rales. Radial pulses 2+ and equal bilaterally. GI: Soft, non-tender,  non-distended. Bowel sounds present.  MS: No lower extremity edema. No deformity. Skin: Warm and dry. Neuro:  No focal deficits. Psych: Normal affect.  Labs    Chemistry Recent Labs  Lab 06/12/18 0443  NA 139  K 4.2  CL 110  CO2 20*  GLUCOSE 155*  BUN 23  CREATININE 1.27*  CALCIUM 9.4  GFRNONAA 45*  GFRAA 52*  ANIONGAP 9     Hematology Recent Labs  Lab 06/12/18 0443  WBC 7.8  RBC 4.01  HGB 10.1*  HCT 33.6*  MCV 83.8  MCH 25.2*  MCHC 30.1  RDW 15.3  PLT 327    Cardiac EnzymesNo results for input(s): TROPONINI in the last 168 hours. No results for input(s): TROPIPOC in the last 168 hours.   BNPNo results for input(s): BNP, PROBNP in the last 168 hours.   DDimer No results for input(s): DDIMER in the last 168 hours.   Radiology    No results found.  Cardiac Studies   None.  Patient Profile   Ms. Cassidy Green is a 63 year old female with hypertension, hyperlipidemia, poorly controlled type 2 diabetes mellitus, and CKD stage III who initially presented to [facility] with unstable angina. Nuclear stress test at [facility] was high risk with LVEF of 31% and a mostly reversible large defect involving the  apex and inferior wall. Patient was transferred to [facility] for cardiac catheterization.   Assessment & Plan    Unstable Angina - Ruled out for MI at [facility]. - Echo pending. - Currently chest pain free.  - Continue Aspirin, beta-blocker, and statin. - Patient is scheduled for left heart catheterization later today. The patient understands that risks include but are not limited to stroke (1 in 1000), death (1 in 1000), kidney failure [usually temporary] (1 in 500), bleeding (1 in 200), allergic reaction [possibly serious] (1 in 200), and agrees to proceed. Patient is currently NPO and orders have been placed.  Hypertension - Most recent BP 144/79.  - Continue Lopressor 25mg  twice daily. - Home Lisinopril being held prior to cardiac  catheterization.   Hyperlipidemia - Lipid panel this admission: Cholesterol 351, Triglycerides 656, HDL 27, LDL unable to be calculated due to triglyceride level.  - Patient on Zetia 10mg  daily at home. Will continue this here.  - Lipitor 40mg  daily has also been added. - Home Lovaza increased to 2g twice daily given triglyceride level.  Type 2 Diabetes Mellitus - Hemoglobin A1c 7.6. - Patient on Sitagliptin-Metformin at home. This has been held in anticipation of cardiac catheterization. - Ordered CBG and sliding scale insulin.   CKD Stage III - Serum creatinine 1.27 today. Baseline anywhere from 1.1 to 1.70 based on results from 2016. - Will continue to monitor renal function daily.   Chronic Anemia - Hemoglobin stable today at 10.1.   For questions or updates, please contact Cassidy Green Please consult [email or web address] for contact info under      Signed, Cassidy ParkerCallie E Goodrich, PA-C  06/12/2018, 7:12 AM    Personally seen and examined. Agree with above.   Currently comfortable in bed, husband at bedside.  We discussed heart catheterization.  She has diabetes, recent shortness of breath with bilateral shoulder and back pain.  She was having trouble walking short distances without getting short of breath.  Also experience nausea with her back pain as well.  [facility]% with an inferior wall reversible defect.  GEN: Well nourished, well developed, in no acute distress  HEENT: normal  Neck: no JVD, carotid bruits, or masses Cardiac: RRR; no murmurs, rubs, or gallops,no edema  Respiratory:  clear to auscultation bilaterally, normal work of breathing GI: soft, nontender, nondistended, + BS MS: no deformity or atrophy  Skin: warm and dry, no rash Neuro:  Alert and Oriented x 3, Strength and sensation are intact Psych: euthymic mood, full affect  Unstable angina with ejection fraction 31%, acute systolic heart failure - Cardiac  catheterization.  Add on board. - Continue with secondary prevention as above. -On low-dose metoprolol currently. -Plan may be to [facility] post catheterization given her reduced EF.  Diabetes with hypertension -Medications reviewed.  Holding lisinopril in anticipation for catheterization.  Hyperlipidemia - Perhaps she would benefit from of Vascepa.  Continue with statin.  High intensity dose.  CKD 3 -Creatinine 1.2 today ranging from 1.1-1.7 previously.  Cognizant of this for heart catheterization.  Donato SchultzMark Marcelle Bebout, MD

## 2018-06-12 NOTE — H&P (Signed)
Cardiology Admission History and Physical:   Patient ID: Cassidy Green MRN: [id number]; DOB: Jun 09, 1955   Admission date: 06/12/2018  Primary Care Provider: Shelbie Ammons, MD Primary Cardiologist: No primary care provider on file.  Primary Electrophysiologist:  None   Chief Complaint: Chest pain over the last 2 weeks.  Patient Profile:   Cassidy Green is a 63 y.o. female with type 2 diabetes poorly controlled, chronic kidney disease stage III, essential hypertension, dyslipidemia poorly controlled with hypertriglyceridemia.  History of Present Illness:   [name] she presents after 2 weeks of unstable angina.  She states she has been in her normal state of health until about the summer when she noticed episodes of shortness of breath sometimes with exertion this teens this was a value by her primary care doctor and appeared to be of no major significance so she was not worried about it.  However more recently about 2 weeks ago she started having chest discomfort which radiated into her shoulders and would occur at night or at with activity and improved with rest typically.  Symptoms have been progressively worsening and becoming more lengthy.    Due to these symptoms she presented to [facility] the other day for evaluation she was ruled out with troponins and had a nuclear stress test which was high risk with noted LVEF of 31% and a mostly reversible large defect involving the apex and an inferior wall.  Due to these findings case was discussed with cardiology and patient was referred for catheterization here at [facility].  Of note at the other hospital she did have CTA of the chest demonstrating coronary calcification and also aortic atherosclerosis.   Past Medical History:  Diagnosis Date  . Anemia   . Anxiety   . Arthritis   . Diabetes mellitus without complication (HCC)    type II  . Female bladder prolapse   . GERD (gastroesophageal reflux disease)   . Headache   .  History of bronchitis   . History of pneumonia   . Hyperlipidemia   . Hypertension   . Joint inflammation     Past Surgical History:  Procedure Laterality Date  . ABDOMINAL HYSTERECTOMY  1992  . COLONOSCOPY    . INCISION AND DRAINAGE HIP Left 09/04/2014   Procedure: IRRIGATION AND DEBRIDEMENT  LEFT HIP WOUND WITH CLOSURE ;  Surgeon: Samson Frederic, MD;  Location: [facility] OR;  Service: Orthopedics;  Laterality: Left;  . LUMBAR LAMINECTOMY/DECOMPRESSION MICRODISCECTOMY  03/11/2015   Procedure: Lumbar Four-Five DECOMPRESSION  AND REMOVAL SYNOVAL CYST;  Surgeon: Venita Lick, MD;  Location: [facility] OR;  Service: Orthopedics;;  . TOTAL HIP ARTHROPLASTY Left 07/30/2014   Procedure: LEFT TOTAL HIP ARTHROPLASTY ANTERIOR APPROACH;  Surgeon: Garnet Koyanagi, MD;  Location: [facility] ORS;  Service: Orthopedics;  Laterality: Left;  . TUBAL LIGATION       Medications Prior to Admission: Prior to Admission medications   Medication Sig Start Date End Date Taking? Authorizing Provider  ALPRAZolam Prudy Feeler) 0.25 MG tablet Take 0.25 mg by mouth daily as needed for anxiety.    [provider]  cetirizine-pseudoephedrine (ZYRTEC-D) 5-120 MG tablet Take 1 tablet by mouth 2 (two) times daily.    [provider]  Cholecalciferol (VITAMIN D) 2000 UNITS CAPS Take 1 capsule by mouth at bedtime.    [provider]  docusate sodium (COLACE) 100 MG capsule Take 1 capsule (100 mg total) by mouth 3 (three) times daily as needed for mild constipation. 03/11/15  Venita LickBrooks, Dahari, MD  ezetimibe (ZETIA) 10 MG tablet Take 10 mg by mouth at bedtime.    [provider]  Ferrous Fumarate (HEMOCYTE PO) Take 1 tablet by mouth daily.     [provider]  fluticasone (FLONASE) 50 MCG/ACT nasal spray Place 1 spray into both nostrils daily.    [provider]  folic acid (FOLVITE) 1 MG tablet Take 1 mg by mouth at bedtime.    [provider]  lisinopril (PRINIVIL,ZESTRIL) 20 MG tablet  Take 20 mg by mouth at bedtime.    [provider]  methocarbamol (ROBAXIN) 500 MG tablet Take 1 tablet (500 mg total) by mouth 3 (three) times daily as needed for muscle spasms. 03/11/15   Venita LickBrooks, Dahari, MD  omeprazole (PRILOSEC) 40 MG capsule Take 40 mg by mouth at bedtime.    [provider]  ondansetron (ZOFRAN) 4 MG tablet Take 1 tablet (4 mg total) by mouth every 8 (eight) hours as needed for nausea or vomiting. 03/11/15   Venita LickBrooks, Dahari, MD  oxyCODONE-acetaminophen (PERCOCET) 10-325 MG tablet Take 1 tablet by mouth every 4 (four) hours as needed for pain. 03/11/15   Venita LickBrooks, Dahari, MD  sitaGLIPtin-metformin (JANUMET) 50-1000 MG per tablet Take 1 tablet by mouth at bedtime.    [provider]  sodium chloride (OCEAN) 0.65 % SOLN nasal spray Place 1 spray into both nostrils at bedtime.    [provider]     Allergies:   No Known Allergies  Social History:   Social History   Socioeconomic History  . Marital status: Married    Spouse name: Not on file  . Number of children: Not on file  . Years of education: Not on file  . Highest education level: Not on file  Occupational History  . Not on file  Social Needs  . Financial resource strain: Not on file  . Food insecurity:    Worry: Not on file    Inability: Not on file  . Transportation needs:    Medical: Not on file    Non-medical: Not on file  Tobacco Use  . Smoking status: Former Smoker    Last attempt to quit: 02/26/2001    Years since quitting: 17.3  Substance and Sexual Activity  . Alcohol use: Yes    Comment: OCCASIONAL  . Drug use: No  . Sexual activity: Not on file  Lifestyle  . Physical activity:    Days per week: Not on file    Minutes per session: Not on file  . Stress: Not on file  Relationships  . Social connections:    Talks on phone: Not on file    Gets together: Not on file    Attends religious service: Not on file    Active member of club or organization: Not on  file    Attends meetings of clubs or organizations: Not on file    Relationship status: Not on file  . Intimate partner violence:    Fear of current or ex partner: Not on file    Emotionally abused: Not on file    Physically abused: Not on file    Forced sexual activity: Not on file  Other Topics Concern  . Not on file  Social History Narrative  . Not on file    Family History:   The patient's family history is not on file.   Both her parents had heart disease as well as family members including her  uncles.  Review of  Systems: [y] = yes, [ ]  = no   . General: Weight gain [ ] ; Weight loss [ ] ; Anorexia [ ] ; Fatigue [ ] ; Fever [ ] ; Chills [ ] ; Weakness [ ]   . Cardiac: Chest pain/pressure [ ] ; Resting SOB [ ] ; Exertional SOB [ ] ; Orthopnea [ ] ; Pedal Edema [ ] ; Palpitations [ ] ; Syncope [ ] ; Presyncope [ ] ; Paroxysmal nocturnal dyspnea[ ]   . Pulmonary: Cough [ ] ; Wheezing[ ] ; Hemoptysis[ ] ; Sputum [ ] ; Snoring [ ]   . GI: Vomiting[ ] ; Dysphagia[ ] ; Melena[ ] ; Hematochezia [ ] ; Heartburn[ ] ; Abdominal pain [ ] ; Constipation [ ] ; Diarrhea [ ] ; BRBPR [ ]   . GU: Hematuria[ ] ; Dysuria [ ] ; Nocturia[ ]   . Vascular: Pain in legs with walking [ ] ; Pain in feet with lying flat [ ] ; Non-healing sores [ ] ; Stroke [ ] ; TIA [ ] ; Slurred speech [ ] ;  . Neuro: Headaches[ ] ; Vertigo[ ] ; Seizures[ ] ; Paresthesias[ ] ;Blurred vision [ ] ; Diplopia [ ] ; Vision changes [ ]   . Ortho/Skin: Arthritis [ ] ; Joint pain [ ] ; Muscle pain [ ] ; Joint swelling [ ] ; Back Pain [ ] ; Rash [ ]   . Psych: Depression[ ] ; Anxiety[ ]   . Heme: Bleeding problems [ ] ; Clotting disorders [ ] ; Anemia [ ]   . Endocrine: Diabetes [ ] ; Thyroid dysfunction[ ]   Physical Exam/Data:   Vitals:   06/12/18 0033  BP: (!) 141/93  Pulse: 81  Temp: 98.2 F (36.8 C)  TempSrc: Oral  SpO2: 100%  Weight: 65.2 kg  Height: 5\' 3"  (1.6 m)   No intake or output data in the 24 hours ending 06/12/18 0152 Filed Weights   06/12/18 0033  Weight:  65.2 kg   Body mass index is 25.46 kg/m.  General:  Well nourished, well developed, in no acute distress HEENT: normal Lymph: no adenopathy Neck: no JVD Endocrine:  No thryomegaly Vascular: No carotid bruits; FA pulses 2+ bilaterally without bruits  Cardiac:  normal S1, S2; RRR; no murmur  Lungs:  clear to auscultation bilaterally, no wheezing, rhonchi or rales  Abd: soft, nontender, no hepatomegaly  Ext: no edema Musculoskeletal:  No deformities, BUE and BLE strength normal and equal Skin: warm and dry  Neuro:  CNs 2-12 intact, no focal abnormalities noted Psych:  Normal affect    EKG:  The ECG that was done  was personally reviewed and demonstrates pending  Relevant CV Studies: pening echo; nuclear stress test at OSH  Laboratory Data:  ChemistryNo results for input(s): NA, K, CL, CO2, GLUCOSE, BUN, CREATININE, CALCIUM, GFRNONAA, GFRAA, ANIONGAP in the last 168 hours.  No results for input(s): PROT, ALBUMIN, AST, ALT, ALKPHOS, BILITOT in the last 168 hours. HematologyNo results for input(s): WBC, RBC, HGB, HCT, MCV, MCH, MCHC, RDW, PLT in the last 168 hours. Cardiac EnzymesNo results for input(s): TROPONINI in the last 168 hours. No results for input(s): TROPIPOC in the last 168 hours.  BNPNo results for input(s): BNP, PROBNP in the last 168 hours.  DDimer No results for input(s): DDIMER in the last 168 hours.  Radiology/Studies:  No results found.  Assessment and Plan:   1. Unstable angina 2. High risk abnormal nuclear stress test 3. Dyslipidemia with hypertriglyceridemia 4. History of pancreatitis related to hypertriglyceridemia 5. Type 2 diabetes mellitus non-insulin-dependent 6. Chronic kidney disease   -We will initiate metoprolol 25 mg p.o. twice daily.  Continue aspirin 81 mg. -No current chest pain so we will not pursue heparin drip at this time.  Has been asymptomatic since presentation to outside hospital and after receiving 1 dose of nitroglycerin. -We  will initiate normal saline given underlying chronic kidney disease. -We will reinitiate statin therapy with atorvastatin 20 mg in addition to Zetia and check lipids.  We will also increase patient's low vase a to 2 g twice daily given known elevated triglycerides. -Echocardiogram has been ordered. -Patient is n.p.o. with plan for cath tomorrow assuming renal function is stable and improved.    Severity of Illness: The appropriate patient status for this patient is INPATIENT. Inpatient status is judged to be reasonable and necessary in order to provide the required intensity of service to ensure the patient's safety. The patient's presenting symptoms, physical exam findings, and initial radiographic and laboratory data in the context of their chronic comorbidities is felt to place them at high risk for further clinical deterioration. Furthermore, it is not anticipated that the patient will be medically stable for discharge from the hospital within 2 midnights of admission. The following factors support the patient status of inpatient.   " The patient's presenting symptoms include unstable angina. " The worrisome physical exam findings include worsening chest pain over the last 2 weeks. " The initial radiographic and laboratory data are worrisome because of abnormal nuclear stress that was high risk. " The chronic co-morbidities include chronic kidney disease, type 2 diabetes, severe dyslipidemia.   * I certify that at the point of admission it is my clinical judgment that the patient will require inpatient hospital care spanning beyond 2 midnights from the point of admission due to high intensity of service, high risk for further deterioration and high frequency of surveillance required.*    For questions or updates, please contact [facility] Please consult [email or web address] for contact info under        Signed, Macie Burows, MD  06/12/2018 1:52 AM

## 2018-06-12 NOTE — Progress Notes (Signed)
Dr. Anne Fu spoke to Dr. Dorris Fetch about  CABG evaluation.

## 2018-06-13 ENCOUNTER — Inpatient Hospital Stay (HOSPITAL_COMMUNITY): Payer: Managed Care, Other (non HMO)

## 2018-06-13 ENCOUNTER — Encounter (HOSPITAL_COMMUNITY): Payer: Self-pay | Admitting: Cardiovascular Disease

## 2018-06-13 ENCOUNTER — Other Ambulatory Visit: Payer: Self-pay | Admitting: *Deleted

## 2018-06-13 DIAGNOSIS — Z01810 Encounter for preprocedural cardiovascular examination: Secondary | ICD-10-CM

## 2018-06-13 DIAGNOSIS — I2510 Atherosclerotic heart disease of native coronary artery without angina pectoris: Secondary | ICD-10-CM

## 2018-06-13 DIAGNOSIS — I10 Essential (primary) hypertension: Secondary | ICD-10-CM

## 2018-06-13 DIAGNOSIS — E119 Type 2 diabetes mellitus without complications: Secondary | ICD-10-CM

## 2018-06-13 DIAGNOSIS — I25110 Atherosclerotic heart disease of native coronary artery with unstable angina pectoris: Secondary | ICD-10-CM

## 2018-06-13 LAB — BASIC METABOLIC PANEL
Anion gap: 8 (ref 5–15)
BUN: 15 mg/dL (ref 8–23)
CALCIUM: 8.9 mg/dL (ref 8.9–10.3)
CO2: 20 mmol/L — ABNORMAL LOW (ref 22–32)
Chloride: 111 mmol/L (ref 98–111)
Creatinine, Ser: 1.26 mg/dL — ABNORMAL HIGH (ref 0.44–1.00)
GFR calc Af Amer: 53 mL/min — ABNORMAL LOW (ref >60)
GFR calc non Af Amer: 45 mL/min — ABNORMAL LOW (ref >60)
Glucose, Bld: 131 mg/dL — ABNORMAL HIGH (ref 70–99)
Potassium: 4.3 mmol/L (ref 3.5–5.1)
Sodium: 139 mmol/L (ref 135–145)

## 2018-06-13 LAB — PULMONARY FUNCTION TEST
FEF 25-75 Pre: 1.9 L/sec
FEF2575-%Pred-Pre: 100 %
FEV1-%Pred-Pre: 81 %
FEV1-Pre: 1.58 L
FEV1FVC-%PRED-PRE: 107 %
FEV6-%Pred-Pre: 78 %
FEV6-PRE: 1.86 L
FEV6FVC-%Pred-Pre: 103 %
FVC-%Pred-Pre: 75 %
FVC-Pre: 1.86 L
Pre FEV1/FVC ratio: 85 %
Pre FEV6/FVC Ratio: 100 %

## 2018-06-13 LAB — CBC
HCT: 31.3 % — ABNORMAL LOW (ref 36.0–46.0)
Hemoglobin: 9.5 g/dL — ABNORMAL LOW (ref 12.0–15.0)
MCH: 25.5 pg — ABNORMAL LOW (ref 26.0–34.0)
MCHC: 30.4 g/dL (ref 30.0–36.0)
MCV: 83.9 fL (ref 80.0–100.0)
Platelets: 298 10*3/uL (ref 150–400)
RBC: 3.73 MIL/uL — ABNORMAL LOW (ref 3.87–5.11)
RDW: 15.2 % (ref 11.5–15.5)
WBC: 7.3 10*3/uL (ref 4.0–10.5)
nRBC: 0 % (ref 0.0–0.2)

## 2018-06-13 LAB — GLUCOSE, CAPILLARY
GLUCOSE-CAPILLARY: 129 mg/dL — AB (ref 70–99)
Glucose-Capillary: 99 mg/dL (ref 70–99)
Glucose-Capillary: 126 mg/dL — ABNORMAL HIGH (ref 70–99)
Glucose-Capillary: 170 mg/dL — ABNORMAL HIGH (ref 70–99)
Glucose-Capillary: 146 mg/dL — ABNORMAL HIGH (ref 70–99)

## 2018-06-13 LAB — LDL CHOLESTEROL, DIRECT: LDL DIRECT: 198 mg/dL — AB (ref 0–99)

## 2018-06-13 LAB — HEPARIN LEVEL (UNFRACTIONATED)
HEPARIN UNFRACTIONATED: 0.41 [IU]/mL (ref 0.30–0.70)
Heparin Unfractionated: 0.39 IU/mL (ref 0.30–0.70)

## 2018-06-13 MED ORDER — SODIUM CHLORIDE 0.9 % IV SOLN
INTRAVENOUS | Status: DC
  Filled 2018-06-13: qty 30

## 2018-06-13 MED ORDER — CHLORHEXIDINE GLUCONATE 0.12 % MT SOLN
15.0000 mL | Freq: Once | OROMUCOSAL | Status: AC
  Administered 2018-06-14: 15 mL via OROMUCOSAL
  Filled 2018-06-13: qty 15

## 2018-06-13 MED ORDER — EPINEPHRINE PF 1 MG/ML IJ SOLN
0.0000 ug/min | INTRAVENOUS | Status: DC
  Filled 2018-06-13: qty 4

## 2018-06-13 MED ORDER — DOPAMINE-DEXTROSE 3.2-5 MG/ML-% IV SOLN
0.0000 ug/kg/min | INTRAVENOUS | Status: DC
  Filled 2018-06-13: qty 250

## 2018-06-13 MED ORDER — MAGNESIUM SULFATE 50 % IJ SOLN
40.0000 meq | INTRAMUSCULAR | Status: DC
  Filled 2018-06-13: qty 9.85

## 2018-06-13 MED ORDER — TRANEXAMIC ACID (OHS) BOLUS VIA INFUSION
15.0000 mg/kg | INTRAVENOUS | Status: AC
  Administered 2018-06-14: 978 mg via INTRAVENOUS
  Filled 2018-06-13: qty 983

## 2018-06-13 MED ORDER — TEMAZEPAM 15 MG PO CAPS
15.0000 mg | ORAL_CAPSULE | Freq: Once | ORAL | Status: AC | PRN
  Administered 2018-06-13: 15 mg via ORAL
  Filled 2018-06-13: qty 1

## 2018-06-13 MED ORDER — DIAZEPAM 2 MG PO TABS
2.0000 mg | ORAL_TABLET | Freq: Once | ORAL | Status: AC
  Administered 2018-06-14: 2 mg via ORAL
  Filled 2018-06-13: qty 1

## 2018-06-13 MED ORDER — TRANEXAMIC ACID (OHS) PUMP PRIME SOLUTION
2.0000 mg/kg | INTRAVENOUS | Status: DC
  Filled 2018-06-13: qty 1.31

## 2018-06-13 MED ORDER — CHLORHEXIDINE GLUCONATE 4 % EX LIQD
CUTANEOUS | Status: AC
  Administered 2018-06-13: 22:00:00
  Filled 2018-06-13: qty 15

## 2018-06-13 MED ORDER — DEXMEDETOMIDINE HCL IN NACL 400 MCG/100ML IV SOLN
0.1000 ug/kg/h | INTRAVENOUS | Status: AC
  Administered 2018-06-14: .2 ug/kg/h via INTRAVENOUS
  Filled 2018-06-13: qty 100

## 2018-06-13 MED ORDER — NOREPINEPHRINE-SODIUM CHLORIDE 4-0.9 MG/250ML-% IV SOLN
0.0000 ug/min | INTRAVENOUS | Status: DC
  Filled 2018-06-13: qty 250

## 2018-06-13 MED ORDER — PHENYLEPHRINE HCL-NACL 20-0.9 MG/250ML-% IV SOLN
30.0000 ug/min | INTRAVENOUS | Status: AC
  Administered 2018-06-14: 30 ug/min via INTRAVENOUS
  Filled 2018-06-13: qty 250

## 2018-06-13 MED ORDER — VANCOMYCIN HCL 10 G IV SOLR
1250.0000 mg | INTRAVENOUS | Status: AC
  Administered 2018-06-14: 1250 mg via INTRAVENOUS
  Filled 2018-06-13: qty 1250

## 2018-06-13 MED ORDER — INSULIN REGULAR(HUMAN) IN NACL 100-0.9 UT/100ML-% IV SOLN
INTRAVENOUS | Status: AC
  Administered 2018-06-14: 1.4 [IU]/h via INTRAVENOUS
  Filled 2018-06-13: qty 100

## 2018-06-13 MED ORDER — SODIUM CHLORIDE 0.9 % IV SOLN
750.0000 mg | INTRAVENOUS | Status: DC
  Filled 2018-06-13: qty 750

## 2018-06-13 MED ORDER — CHLORHEXIDINE GLUCONATE CLOTH 2 % EX PADS
6.0000 | MEDICATED_PAD | Freq: Once | CUTANEOUS | Status: AC
  Administered 2018-06-14: 6 via TOPICAL

## 2018-06-13 MED ORDER — PLASMA-LYTE 148 IV SOLN
INTRAVENOUS | Status: AC
  Administered 2018-06-14: 500 mL
  Filled 2018-06-13: qty 2.5

## 2018-06-13 MED ORDER — NITROGLYCERIN IN D5W 200-5 MCG/ML-% IV SOLN
2.0000 ug/min | INTRAVENOUS | Status: DC
  Filled 2018-06-13: qty 250

## 2018-06-13 MED ORDER — TRANEXAMIC ACID 1000 MG/10ML IV SOLN
1.5000 mg/kg/h | INTRAVENOUS | Status: DC
  Filled 2018-06-13: qty 25

## 2018-06-13 MED ORDER — BISACODYL 5 MG PO TBEC
5.0000 mg | DELAYED_RELEASE_TABLET | Freq: Once | ORAL | Status: AC
  Administered 2018-06-13: 5 mg via ORAL
  Filled 2018-06-13: qty 1

## 2018-06-13 MED ORDER — METOPROLOL TARTRATE 12.5 MG HALF TABLET
12.5000 mg | ORAL_TABLET | Freq: Once | ORAL | Status: AC
  Administered 2018-06-14: 12.5 mg via ORAL
  Filled 2018-06-13: qty 1

## 2018-06-13 MED ORDER — POTASSIUM CHLORIDE 2 MEQ/ML IV SOLN
80.0000 meq | INTRAVENOUS | Status: DC
  Filled 2018-06-13: qty 40

## 2018-06-13 MED ORDER — CHLORHEXIDINE GLUCONATE CLOTH 2 % EX PADS
6.0000 | MEDICATED_PAD | Freq: Once | CUTANEOUS | Status: AC
  Administered 2018-06-13: 6 via TOPICAL

## 2018-06-13 MED ORDER — MILRINONE LACTATE IN DEXTROSE 20-5 MG/100ML-% IV SOLN
0.3000 ug/kg/min | INTRAVENOUS | Status: DC
  Filled 2018-06-13: qty 100

## 2018-06-13 MED ORDER — SODIUM CHLORIDE 0.9 % IV SOLN
1.5000 g | INTRAVENOUS | Status: AC
  Administered 2018-06-14 (×2): 1.5 g via INTRAVENOUS
  Filled 2018-06-13: qty 1.5

## 2018-06-13 MED FILL — Verapamil HCl IV Soln 2.5 MG/ML: INTRAVENOUS | Qty: 2 | Status: AC

## 2018-06-13 NOTE — Progress Notes (Signed)
[id number] Gave pt [facility] booklet, IS, care guide and in the tube handout. Wrote down how to view pre op video. Discussed with pt the need to have someone with her first week home. She stated she would work on it. Pt given IS and demonstrated 1300 ml correctly. Discussed the importance of mobility and IS after surgery.Marland Kitchen discussed sternal precautions and staying in the tube.  Did not walk due to LM disease. Will follow up after surgery. Luetta Nutting RN BSN 06/13/2018 1:51 PM

## 2018-06-13 NOTE — Progress Notes (Signed)
Progress Note  Patient Name: Cassidy CandySusan S Gentzler Date of Encounter: 06/13/2018  Primary Cardiologist: Donato SchultzMark Skains, MD   Subjective   Post catheterization.  No further chest pain.  Does have triple-vessel disease.  Awaiting bypass.  [facility]  Inpatient Medications    Scheduled Meds: . aspirin EC  81 mg Oral Daily  . ezetimibe  10 mg Oral Daily  . fluticasone  1 spray Each Nare Daily  . folic acid  1 mg Oral Daily  . insulin aspart  0-9 Units Subcutaneous TID WC  . loratadine  10 mg Oral Daily  . metoprolol tartrate  25 mg Oral BID  . omega-3 acid ethyl esters  2 g Oral BID  . pantoprazole  40 mg Oral Daily  . sodium chloride flush  3 mL Intravenous Q12H   Continuous Infusions: . sodium chloride 75 mL/hr at 06/12/18 2231  . sodium chloride    . heparin 900 Units/hr (06/13/18 0600)   PRN Meds: sodium chloride, acetaminophen, docusate sodium, methocarbamol, nitroGLYCERIN, ondansetron (ZOFRAN) IV, ondansetron, sodium chloride flush   Vital Signs    Vitals:   06/12/18 1949 06/12/18 2000 06/12/18 2100 06/13/18 0433  BP: (!) 168/92 (!) 143/121 (!) 137/93 133/81  Pulse:    79  Resp: 12 12 18 14   Temp: 98.3 F (36.8 C)   97.9 F (36.6 C)  TempSrc: Oral   Oral  SpO2:    95%  Weight:    65.5 kg  Height:        Intake/Output Summary (Last 24 hours) at 06/13/2018 0843 Last data filed at 06/13/2018 0600 Gross per 24 hour  Intake 971.43 ml  Output -  Net 971.43 ml   Last 3 Weights 06/13/2018 06/12/2018 03/11/2015  Weight (lbs) 144 lb 6.4 oz 143 lb 11.2 oz 135 lb  Weight (kg) 65.5 kg 65.182 kg 61.236 kg      Telemetry    Sinus rhythm no other abnormalities- Personally Reviewed  ECG    Subtle approxi-1 mm ST segment elevation in the anterior precordial leads- Personally Reviewed  Physical Exam   GEN: No acute distress.   Neck: No JVD Cardiac: RRR, no murmurs, rubs, or gallops.  Catheterization site normal Respiratory: Clear to auscultation  bilaterally. GI: Soft, nontender, non-distended  MS: No edema; No deformity. Neuro:  Nonfocal  Psych: Normal affect   Labs    Chemistry Recent Labs  Lab 06/12/18 0443 06/13/18 0213  NA 139 139  K 4.2 4.3  CL 110 111  CO2 20* 20*  GLUCOSE 155* 131*  BUN 23 15  CREATININE 1.27* 1.26*  CALCIUM 9.4 8.9  GFRNONAA 45* 45*  GFRAA 52* 53*  ANIONGAP 9 8     Hematology Recent Labs  Lab 06/12/18 0443 06/13/18 0213  WBC 7.8 7.3  RBC 4.01 3.73*  HGB 10.1* 9.5*  HCT 33.6* 31.3*  MCV 83.8 83.9  MCH 25.2* 25.5*  MCHC 30.1 30.4  RDW 15.3 15.2  PLT 327 298    Cardiac EnzymesNo results for input(s): TROPONINI in the last 168 hours. No results for input(s): TROPIPOC in the last 168 hours.   BNPNo results for input(s): BNP, PROBNP in the last 168 hours.   DDimer No results for input(s): DDIMER in the last 168 hours.   Radiology    No results found.  Cardiac Studies   [facility] LVEF of 31% and reversible large defect including the inferior wall and apex  Echocardiogram 06/12/2018  -  Left ventricle: The cavity size was normal. Wall thickness was   normal. Systolic function was normal. The estimated ejection   fraction was in the range of 50% to 55%. Moderate hypokinesis of   the basalinferior myocardium. Features are consistent with a   pseudonormal left ventricular filling pattern, with concomitant   abnormal relaxation and increased filling pressure (grade 2   diastolic dysfunction).  Cardiac catheterization 06/12/2018  Ost RPDA to RPDA lesion is 99% stenosed.  Mid RCA lesion is 50% stenosed.  Mid RCA to Dist RCA lesion is 80% stenosed.  Dist LM to Prox LAD lesion is 90% stenosed.  Prox LAD lesion is 80% stenosed.  Mid LAD to Dist LAD lesion is 100% stenosed.  Ost Cx to Prox Cx lesion is 70% stenosed.   Severe multivessel CAD with 90% near ostial LAD stenosis followed by 80% stenosis after the first diagonal vessel which had 40%  stenoses in the diagonal vessel.  The mid LAD is subtotal/totally occluded and there is collateralization from septal perforators supplying the apical portion of the LAD which wraps around the apex.  There is also collateralization to the distal RCA via the left coronary injection ;  70% proximal eccentric circumflex stenosis; right coronary artery with diffuse mid 50% narrowing followed by diffuse 80% stenosis beyond the acute margin and 99% PDA stenosis with evidence for both antegrade and retrograde filling of the distal PDA.  Mild LV dysfunction with EF at 50% with focal mild basal inferior hypocontractility.  LVEDP 17 mm.  RECOMMENDATION: Surgical consultation for consideration of CABG revascularization.  In light of the patient's significant disease, she will be restarted on heparin 8 hours post sheath discontinuance.  If patient is intolerant to statins, consider alternative such as Zetia and possible PCSK9 inhibition.  Diagnostic  Dominance: Right        Patient Profile     63 y.o. female with multivessel coronary artery disease awaiting bypass.  Assessment & Plan    Coronary artery disease -Catheterization as above.  Bypass.  Surgical consult appreciated.  Continue with aggressive secondary risk factor modification.  Statin, beta-blocker, aspirin, back on IV heparin awaiting bypass.  Diabetes with hypertension and hyperlipidemia -Triglycerides significantly elevated 656, LDL unable to be calculated.  Currently on Zetia at home.  Lipitor 40 has been added, home Lovaza has been increased to 2 g twice a day  CKD stage III -Creatinine approximately 1.2.  Has been as high as 1.7 in 2016.  Mild chronic anemia -Hemoglobin 9.5 - 10.1.  Stable.  For questions or updates, please contact [facility] Please consult [email or web address] for contact info under        Signed, Donato Schultz, MD  06/13/2018, 8:43 AM

## 2018-06-13 NOTE — H&P (View-Only) (Signed)
Reason for Consult: 3 vessel CAD Referring Physician: [name] is an 63 y.o. female.  HPI: 63 yo woman presents with a cc/o of subscapular pain with shortness of breath and nausea.  Cassidy Green is a 64 yo woman with a past history of type 2 diabetes, hypertension, hyperlipidemia, stage III CKD, reflux, arthritis, spinal stenosis, remote tobacco abuse (quit 18 years ago), anemia and anxiety. She has no prior cardiac history. In August 2019 she began experiencing subscapular pain associated with shortness of breath and nausea and sometimes with vomiting. She saw her primary and nothing serious was noted. Her pain resolved until about 2 weeks ago when she began having the same symptoms. Initially with exertion, then could come on at any time. Symptoms becoming more frequent, lasting longer and more intense.  She went back to her doctor. A nuclear stress test showed an EF of 31% and a large reversible defect in the apex and inferior wall. An echo showed an EF of 50% with inferior basal hypokinesis. No significant valve pathology.  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 Currently pain free. Past Medical History:  Diagnosis Date  . Anemia   . Anxiety   . Arthritis   . Diabetes mellitus without complication (HCC)    type II  . Female bladder prolapse   . GERD (gastroesophageal reflux disease)   . Headache   . History of bronchitis   . History of pneumonia   . Hyperlipidemia   . Hypertension   . Joint inflammation     Past Surgical History:  Procedure Laterality Date  . ABDOMINAL HYSTERECTOMY  1992  . COLONOSCOPY    . INCISION AND DRAINAGE HIP Left 09/04/2014   Procedure: IRRIGATION AND DEBRIDEMENT  LEFT HIP WOUND WITH CLOSURE ;  Surgeon: Samson Frederic, MD;  Location: [facility] OR;  Service: Orthopedics;  Laterality: Left;  . LEFT HEART CATH AND CORONARY ANGIOGRAPHY N/A 06/12/2018   Procedure: LEFT HEART CATH AND CORONARY ANGIOGRAPHY;  Surgeon: Lennette Bihari, MD;  Location: [facility]  INVASIVE CV LAB;  Service: Cardiovascular;  Laterality: N/A;  . LUMBAR LAMINECTOMY/DECOMPRESSION MICRODISCECTOMY  03/11/2015   Procedure: Lumbar Four-Five DECOMPRESSION  AND REMOVAL SYNOVAL CYST;  Surgeon: Venita Lick, MD;  Location: [facility] OR;  Service: Orthopedics;;  . TOTAL HIP ARTHROPLASTY Left 07/30/2014   Procedure: LEFT TOTAL HIP ARTHROPLASTY ANTERIOR APPROACH;  Surgeon: Garnet Koyanagi, MD;  Location: [facility] ORS;  Service: Orthopedics;  Laterality: Left;  . TUBAL LIGATION    . ULTRASOUND GUIDANCE FOR VASCULAR ACCESS  06/12/2018   Procedure: Ultrasound Guidance For Vascular Access;  Surgeon: Lennette Bihari, MD;  Location: [facility] INVASIVE CV LAB;  Service: Cardiovascular;;    History reviewed. No pertinent family history.  Social History:  reports that she quit smoking about 17 years ago. She has never used smokeless tobacco. She reports current alcohol use. She reports that she does not use drugs.  Allergies:  Allergies  Allergen Reactions  . Statins Other (See Comments)     " I cant recall the name of the statin, but I had joint pain with one of the statins "    Medications:  Scheduled: . aspirin EC  81 mg Oral Daily  . ezetimibe  10 mg Oral Daily  . fluticasone  1 spray Each Nare Daily  . folic acid  1 mg Oral Daily  . [START ON 06/14/2018] heparin-papaverine-plasmalyte irrigation   Irrigation To OR  . insulin aspart  0-9 Units Subcutaneous TID WC  . [  START ON 06/14/2018] insulin   Intravenous To OR  . loratadine  10 mg Oral Daily  . [START ON 06/14/2018] magnesium sulfate  40 mEq Other To OR  . metoprolol tartrate  25 mg Oral BID  . [START ON 06/14/2018] norepinephrine  0-40 mcg/min Intravenous To OR  . omega-3 acid ethyl esters  2 g Oral BID  . pantoprazole  40 mg Oral Daily  . [START ON 06/14/2018] phenylephrine  30-200 mcg/min Intravenous To OR  . [START ON 06/14/2018] potassium chloride  80 mEq Other To OR  . sodium chloride flush  3 mL Intravenous Q12H  . [START ON 06/14/2018]  tranexamic acid  15 mg/kg Intravenous To OR  . [START ON 06/14/2018] tranexamic acid  2 mg/kg Intracatheter To OR    Results for orders placed or performed during the hospital encounter of 06/12/18 (from the past 48 hour(s))  Glucose, capillary     Status: Abnormal   Collection Time: 06/12/18 12:48 AM  Result Value Ref Range   Glucose-Capillary 116 (H) 70 - 99 mg/dL  Lipid panel     Status: Abnormal   Collection Time: 06/12/18  4:43 AM  Result Value Ref Range   Cholesterol 351 (H) 0 - 200 mg/dL   Triglycerides 409 (H) <150 mg/dL   HDL 27 (L) >81 mg/dL   Total CHOL/HDL Ratio 13.0 RATIO   VLDL UNABLE TO CALCULATE IF TRIGLYCERIDE OVER 400 mg/dL 0 - 40 mg/dL   LDL Cholesterol UNABLE TO CALCULATE IF TRIGLYCERIDE OVER 400 mg/dL 0 - 99 mg/dL    Comment:        Total Cholesterol/HDL:CHD Risk Coronary Heart Disease Risk Table                     Men   Women  1/2 Average Risk   3.4   3.3  Average Risk       5.0   4.4  2 X Average Risk   9.6   7.1  3 X Average Risk  23.4   11.0        Use the calculated Patient Ratio above and the CHD Risk Table to determine the patient's CHD Risk.        ATP III CLASSIFICATION (LDL):  <100     mg/dL   Optimal  191-478  mg/dL   Near or Above                    Optimal  130-159  mg/dL   Borderline  295-621  mg/dL   High  >308     mg/dL   Very High Performed at [facility], [address], [place], [place] [address]   Hemoglobin A1c     Status: Abnormal   Collection Time: 06/12/18  4:43 AM  Result Value Ref Range   Hgb A1c MFr Bld 7.6 (H) 4.8 - 5.6 %    Comment: (NOTE) Pre diabetes:          5.7%-6.4% Diabetes:              >6.4% Glycemic control for   <7.0% adults with diabetes    Mean Plasma Glucose 171.42 mg/dL    Comment: Performed at [facility], [address] [place], [place] [address]  LDL cholesterol, direct     Status: Abnormal   Collection Time: 06/12/18  4:43 AM  Result Value Ref Range   Direct LDL 198 (H) 0 - 99  mg/dL  Comment: Comment     Comment: (NOTE) Possible Familial Hypercholesterolemia. FH should be suspected when fasting LDL cholesterol is above 189 mg/dL or non-HDL cholesterol is above 219 mg/dL. A family history of high cholesterol and heart disease in 1st degree relatives should be collected. J Clin Lipidol 2011;5:133-140 Performed At: [facility] [address] [place], [place] [id number] Jolene Schimke MD [id number]   HIV antibody (Routine Testing)     Status: None   Collection Time: 06/12/18  4:43 AM  Result Value Ref Range   HIV Screen 4th Generation wRfx Non Reactive Non Reactive    Comment: (NOTE) Performed At: [facility] [address] [place], [place] [id number] Jolene Schimke MD [id number]   CBC     Status: Abnormal   Collection Time: 06/12/18  4:43 AM  Result Value Ref Range   WBC 7.8 4.0 - 10.5 K/uL   RBC 4.01 3.87 - 5.11 MIL/uL   Hemoglobin 10.1 (L) 12.0 - 15.0 g/dL   HCT 41.3 (L) 24.4 - 01.0 %   MCV 83.8 80.0 - 100.0 fL   MCH 25.2 (L) 26.0 - 34.0 pg   MCHC 30.1 30.0 - 36.0 g/dL   RDW 27.2 53.6 - 64.4 %   Platelets 327 150 - 400 K/uL   nRBC 0.0 0.0 - 0.2 %    Comment: Performed at [facility], [address] [place], [place] [address]  Basic metabolic panel     Status: Abnormal   Collection Time: 06/12/18  4:43 AM  Result Value Ref Range   Sodium 139 135 - 145 mmol/L   Potassium 4.2 3.5 - 5.1 mmol/L   Chloride 110 98 - 111 mmol/L   CO2 20 (L) 22 - 32 mmol/L   Glucose, Bld 155 (H) 70 - 99 mg/dL   BUN 23 8 - 23 mg/dL   Creatinine, Ser 2.59 (H) 0.44 - 1.00 mg/dL   Calcium 9.4 8.9 - 56.3 mg/dL   GFR calc non Af Amer 45 (L) >60 mL/min   GFR calc Af Amer 52 (L) >60 mL/min   Anion gap 9 5 - 15    Comment: Performed at [facility], [address] [place], [place] [address]  Protime-INR     Status: None   Collection Time: 06/12/18  4:43 AM  Result Value Ref Range   Prothrombin Time 14.7 11.4 - 15.2 seconds   INR  1.16     Comment: Performed at [facility], [address], [place], [place] [address]  Glucose, capillary     Status: Abnormal   Collection Time: 06/12/18  7:40 AM  Result Value Ref Range   Glucose-Capillary 128 (H) 70 - 99 mg/dL  Glucose, capillary     Status: Abnormal   Collection Time: 06/12/18 11:46 AM  Result Value Ref Range   Glucose-Capillary 119 (H) 70 - 99 mg/dL  POCT Activated clotting time     Status: None   Collection Time: 06/12/18  4:45 PM  Result Value Ref Range   Activated Clotting Time 98 seconds  Glucose, capillary     Status: None   Collection Time: 06/12/18  5:15 PM  Result Value Ref Range   Glucose-Capillary 99 70 - 99 mg/dL  Glucose, capillary     Status: Abnormal   Collection Time: 06/12/18  6:22 PM  Result Value Ref Range   Glucose-Capillary 103 (H) 70 - 99 mg/dL   Comment 1 Notify RN    Comment 2 Document in  Chart   Glucose, capillary     Status: Abnormal   Collection Time: 06/12/18  9:30 PM  Result Value Ref Range   Glucose-Capillary 271 (H) 70 - 99 mg/dL  Basic metabolic panel     Status: Abnormal   Collection Time: 06/13/18  2:13 AM  Result Value Ref Range   Sodium 139 135 - 145 mmol/L   Potassium 4.3 3.5 - 5.1 mmol/L   Chloride 111 98 - 111 mmol/L   CO2 20 (L) 22 - 32 mmol/L   Glucose, Bld 131 (H) 70 - 99 mg/dL   BUN 15 8 - 23 mg/dL   Creatinine, Ser 0.24 (H) 0.44 - 1.00 mg/dL   Calcium 8.9 8.9 - 09.7 mg/dL   GFR calc non Af Amer 45 (L) >60 mL/min   GFR calc Af Amer 53 (L) >60 mL/min   Anion gap 8 5 - 15    Comment: Performed at [facility], [address] [place], [place] [address]  CBC     Status: Abnormal   Collection Time: 06/13/18  2:13 AM  Result Value Ref Range   WBC 7.3 4.0 - 10.5 K/uL   RBC 3.73 (L) 3.87 - 5.11 MIL/uL   Hemoglobin 9.5 (L) 12.0 - 15.0 g/dL   HCT 92.4 (L) 26.8 - 34.1 %   MCV 83.9 80.0 - 100.0 fL   MCH 25.5 (L) 26.0 - 34.0 pg   MCHC 30.4 30.0 - 36.0 g/dL   RDW 96.2 22.9 - 79.8 %   Platelets 298  150 - 400 K/uL   nRBC 0.0 0.0 - 0.2 %    Comment: Performed at [facility], [address], [place], [place] [address]  Heparin level (unfractionated)     Status: None   Collection Time: 06/13/18  7:48 AM  Result Value Ref Range   Heparin Unfractionated 0.39 0.30 - 0.70 IU/mL    Comment: (NOTE) If heparin results are below expected values, and patient dosage has  been confirmed, suggest follow up testing of antithrombin III levels. Performed at [facility], [address], [place], [place] [address]   Glucose, capillary     Status: Abnormal   Collection Time: 06/13/18  7:57 AM  Result Value Ref Range   Glucose-Capillary 126 (H) 70 - 99 mg/dL  Glucose, capillary     Status: Abnormal   Collection Time: 06/13/18 12:22 PM  Result Value Ref Range   Glucose-Capillary 129 (H) 70 - 99 mg/dL    Vas US Doppler Pre Cabg  Result Date: 06/13/2018 PREOPERATIVE VASCULAR EVALUATION  Risk Factors:     Hypertension, hyperlipidemia, Diabetes, coronary artery                   disease. Comparison Study: No prior study on file Performing Technologist: Sherren Kerns RVS  Examination Guidelines: A complete evaluation includes B-mode imaging, spectral Doppler, color Doppler, and power Doppler as needed of all accessible portions of each vessel. Bilateral testing is considered an integral part of a complete examination. Limited examinations for reoccurring indications may be performed as noted.  Right Carotid Findings: +----------+--------+--------+--------+-----------+------------------+           PSV cm/sEDV cm/sStenosisDescribe   Comments           +----------+--------+--------+--------+-----------+------------------+ CCA Prox  83      21              homogeneous                   +----------+--------+--------+--------+-----------+------------------+  CCA Distal80      25                         intimal thickening  +----------+--------+--------+--------+-----------+------------------+ ICA Prox  59      17              homogeneoustortuous           +----------+--------+--------+--------+-----------+------------------+ ICA Distal95      41                         tortuous           +----------+--------+--------+--------+-----------+------------------+ ECA       88      8                                             +----------+--------+--------+--------+-----------+------------------+ Portions of this table do not appear on this page. +----------+--------+-------+--------+------------+           PSV cm/sEDV cmsDescribeArm Pressure +----------+--------+-------+--------+------------+ Subclavian65                     149          +----------+--------+-------+--------+------------+ +---------+--------+--+--------+-+ VertebralPSV cm/s34EDV cm/s8 +---------+--------+--+--------+-+ Left Carotid Findings: +----------+--------+--------+--------+-----------+------------------+           PSV cm/sEDV cm/sStenosisDescribe   Comments           +----------+--------+--------+--------+-----------+------------------+ CCA Prox  124     28                         intimal thickening +----------+--------+--------+--------+-----------+------------------+ CCA Distal95      28                         intimal thickening +----------+--------+--------+--------+-----------+------------------+ ICA Prox  93      35              homogeneoustortuous           +----------+--------+--------+--------+-----------+------------------+ ICA Distal74      25                         tortuous           +----------+--------+--------+--------+-----------+------------------+ +----------+--------+--------+--------+------------+ SubclavianPSV cm/sEDV cm/sDescribeArm Pressure +----------+--------+--------+--------+------------+           90                      158           +----------+--------+--------+--------+------------+ +---------+--------+--+--------+--+ VertebralPSV cm/s67EDV cm/s27 +---------+--------+--+--------+--+  ABI Findings: +--------+------------------+-----+---------+--------+ Right   Rt Pressure (mmHg)IndexWaveform Comment  +--------+------------------+-----+---------+--------+ [id number]                    triphasic         +--------+------------------+-----+---------+--------+ PTA     147               0.93 triphasic         +--------+------------------+-----+---------+--------+ DP      153               0.97 triphasic         +--------+------------------+-----+---------+--------+ +--------+------------------+-----+---------+-------+ Left    Lt Pressure (mmHg)IndexWaveform Comment +--------+------------------+-----+---------+-------+ [id number]  triphasic        +--------+------------------+-----+---------+-------+ PTA     164               1.04 triphasic        +--------+------------------+-----+---------+-------+ DP      169               1.07 triphasic        +--------+------------------+-----+---------+-------+ +-------+---------------+----------------+ ABI/TBIToday's ABI/TBIPrevious ABI/TBI +-------+---------------+----------------+ Right  0.97                            +-------+---------------+----------------+ Left   1.07                            +-------+---------------+----------------+  Right Doppler Findings: +-----------+--------+-----+---------+-----------------------------------------+ Site       PressureIndexDoppler  Comments                                  +-----------+--------+-----+---------+-----------------------------------------+ Brachial   149          triphasic                                          +-----------+--------+-----+---------+-----------------------------------------+ Radial                  triphasic                                           +-----------+--------+-----+---------+-----------------------------------------+ Ulnar                   triphasic                                          +-----------+--------+-----+---------+-----------------------------------------+ Palmar Arch                      Doppler signal remains normal with both                                    radial and ulnar compression              +-----------+--------+-----+---------+-----------------------------------------+  Left Doppler Findings: +-----------+--------+-----+---------+-----------------------------------------+ Site       PressureIndexDoppler  Comments                                  +-----------+--------+-----+---------+-----------------------------------------+ Brachial   158          triphasic                                          +-----------+--------+-----+---------+-----------------------------------------+ Radial                  triphasic                                          +-----------+--------+-----+---------+-----------------------------------------+  Ulnar                   triphasic                                          +-----------+--------+-----+---------+-----------------------------------------+ Palmar Arch                      Doppler signal obliterates with radial                                     compression and reverses with ulnar                                        compression                               +-----------+--------+-----+---------+-----------------------------------------+  Summary: Right Carotid: The extracranial vessels were near-normal with only minimal wall                thickening or plaque. Left Carotid: The extracranial vessels were near-normal with only minimal wall               thickening or plaque. Vertebrals:  Bilateral vertebral arteries demonstrate antegrade flow. Subclavians: Normal flow hemodynamics were seen in  bilateral subclavian              arteries. Right ABI: Resting right ankle-brachial index is within normal range. No evidence of significant right lower extremity arterial disease. Left ABI: Resting left ankle-brachial index is within normal range. No evidence of significant left lower extremity arterial disease. Right Upper Extremity: Doppler waveforms remain within normal limits with right radial compression. Doppler waveform obliterate with right ulnar compression. Left Upper Extremity: Doppler waveforms remain within normal limits with left radial compression. Doppler waveforms reverse with left ulnar compression.     Preliminary    ECHOCARDIOGRAM Study Conclusions  - Left ventricle: The cavity size was normal. Wall thickness was   normal. Systolic function was normal. The estimated ejection   fraction was in the range of 50% to 55%. Moderate hypokinesis of   the basalinferior myocardium. Features are consistent with a   pseudonormal left ventricular filling pattern, with concomitant   abnormal relaxation and increased filling pressure (grade 2   diastolic dysfunction).  CARDIAC CATHETERIZATION Conclusion     Ost RPDA to RPDA lesion is 99% stenosed.  Mid RCA lesion is 50% stenosed.  Mid RCA to Dist RCA lesion is 80% stenosed.  Dist LM to Prox LAD lesion is 90% stenosed.  Prox LAD lesion is 80% stenosed.  Mid LAD to Dist LAD lesion is 100% stenosed.  Ost Cx to Prox Cx lesion is 70% stenosed.   Severe multivessel CAD with 90% near ostial LAD stenosis followed by 80% stenosis after the first diagonal vessel which had 40% stenoses in the diagonal vessel.  The mid LAD is subtotal/totally occluded and there is collateralization from septal perforators supplying the apical portion of the LAD which wraps around the apex.  There is also collateralization to the distal RCA via the left coronary injection ;  70% proximal eccentric circumflex stenosis; right coronary artery with  diffuse mid  50% narrowing followed by diffuse 80% stenosis beyond the acute margin and 99% PDA stenosis with evidence for both antegrade and retrograde filling of the distal PDA.  Mild LV dysfunction with EF at 50% with focal mild basal inferior hypocontractility.  LVEDP 17 mm.  RECOMMENDATION: Surgical consultation for consideration of CABG revascularization.  In light of the patient's significant disease, she will be restarted on heparin 8 hours post sheath discontinuance.  If patient is intolerant to statins, consider alternative such as Zetia and possible PCSK9 inhibition.  I personally reviewed the cath images and concur with the findings noted above  Review of Systems  Constitutional: Positive for malaise/fatigue. Negative for chills and fever.  Respiratory: Positive for shortness of breath and wheezing.   Cardiovascular: Positive for chest pain. Negative for claudication and leg swelling.  Gastrointestinal: Positive for nausea and vomiting.  Genitourinary: Positive for urgency. Negative for dysuria.  Musculoskeletal: Positive for back pain and joint pain.  Neurological: Negative for focal weakness and loss of consciousness.  Psychiatric/Behavioral: The patient is nervous/anxious.    Blood pressure (!) 158/90, pulse 79, temperature 98.6 F (37 C), temperature source Oral, resp. rate 19, height 5\' 3"  (1.6 m), weight 65.5 kg, SpO2 99 %. Physical Exam  Vitals reviewed. Constitutional: She is oriented to person, place, and time. She appears well-developed and well-nourished. No distress.  HENT:  Head: Normocephalic and atraumatic.  Mouth/Throat: No oropharyngeal exudate.  Eyes: Pupils are equal, round, and reactive to light. Conjunctivae and EOM are normal. No scleral icterus.  Neck: Neck supple. No thyromegaly present.  No carotid bruits  Cardiovascular: Normal rate, regular rhythm and normal heart sounds. Exam reveals no gallop and no friction rub.  No murmur heard. Pulses:      Carotid  pulses are 2+ on the right side and 2+ on the left side.      Femoral pulses are 2+ on the right side and 2+ on the left side.      Dorsalis pedis pulses are 1+ on the right side and 1+ on the left side.       Posterior tibial pulses are 1+ on the right side and 1+ on the left side.  Respiratory: Effort normal and breath sounds normal. No respiratory distress. She has no wheezes. She has no rales.  GI: Soft. She exhibits no distension. There is no abdominal tenderness.  Musculoskeletal:        General: No edema.  Lymphadenopathy:    She has no cervical adenopathy.  Neurological: She is alert and oriented to person, place, and time. No cranial nerve deficit. She exhibits normal muscle tone. Coordination abnormal.  Skin: Skin is warm and dry.    Assessment/Plan: 63 yo woman with multiple CRF presents with class IV angina. Found to have a positive nuclear stress test. Cath reveals severe 3 vessel CAD with diffusely disease RCA system and distal LAD. CABG indicated for survival benefit and relief of symptoms.  I discussed with the patient the general nature of the procedure, the need for general anesthesia, the use of cardiopulmonary bypass, and the incisions to be used. We discussed the expected hospital stay, overall recovery and short and long term outcomes. Informed her of the indications, risks, benefits and alternatives.  She understands the risks include, but are not limited to death, stroke, MI, DVT/PE, bleeding, possible need for transfusion, infections, cardiac arrhythmias, as well as other organ system dysfunction including respiratory, renal, or GI complications. Higher risk for renal issues in  setting of CKD.  She accepts the risks and agrees to proceed.  [name] 06/13/2018, 2:02 PM

## 2018-06-13 NOTE — Progress Notes (Signed)
VASCULAR LAB PRELIMINARY  PRELIMINARY  PRELIMINARY  PRELIMINARY  Pre CABG Dopplers completed.    Preliminary report: See results in CV PROC  Nautika Cressey, RVT 06/13/2018, 12:23 PM

## 2018-06-13 NOTE — Consult Note (Signed)
Reason for Consult: 3 vessel CAD Referring Physician: Dr. Skains  Cassidy Green is an 62 y.o. female.  HPI: 62 yo woman presents with a cc/o of subscapular pain with shortness of breath and nausea.  Cassidy Green is a 62 yo woman with a past history of type 2 diabetes, hypertension, hyperlipidemia, stage III CKD, reflux, arthritis, spinal stenosis, remote tobacco abuse (quit 18 years ago), anemia and anxiety. She has no prior cardiac history. In August 2019 she began experiencing subscapular pain associated with shortness of breath and nausea and sometimes with vomiting. She saw her primary and nothing serious was noted. Her pain resolved until about 2 weeks ago when she began having the same symptoms. Initially with exertion, then could come on at any time. Symptoms becoming more frequent, lasting longer and more intense.  She went back to her doctor. A nuclear stress test showed an EF of 31% and a large reversible defect in the apex and inferior wall. An echo showed an EF of 50% with inferior basal hypokinesis. No significant valve pathology.  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 Currently pain free. Past Medical History:  Diagnosis Date  . Anemia   . Anxiety   . Arthritis   . Diabetes mellitus without complication (HCC)    type II  . Female bladder prolapse   . GERD (gastroesophageal reflux disease)   . Headache   . History of bronchitis   . History of pneumonia   . Hyperlipidemia   . Hypertension   . Joint inflammation     Past Surgical History:  Procedure Laterality Date  . ABDOMINAL HYSTERECTOMY  1992  . COLONOSCOPY    . INCISION AND DRAINAGE HIP Left 09/04/2014   Procedure: IRRIGATION AND DEBRIDEMENT  LEFT HIP WOUND WITH CLOSURE ;  Surgeon: Brian Swinteck, MD;  Location: [facility] OR;  Service: Orthopedics;  Laterality: Left;  . LEFT HEART CATH AND CORONARY ANGIOGRAPHY N/A 06/12/2018   Procedure: LEFT HEART CATH AND CORONARY ANGIOGRAPHY;  Surgeon: Kelly, Thomas A, MD;  Location: [facility]  INVASIVE CV LAB;  Service: Cardiovascular;  Laterality: N/A;  . LUMBAR LAMINECTOMY/DECOMPRESSION MICRODISCECTOMY  03/11/2015   Procedure: Lumbar Four-Five DECOMPRESSION  AND REMOVAL SYNOVAL CYST;  Surgeon: Dahari Brooks, MD;  Location: [facility] OR;  Service: Orthopedics;;  . TOTAL HIP ARTHROPLASTY Left 07/30/2014   Procedure: LEFT TOTAL HIP ARTHROPLASTY ANTERIOR APPROACH;  Surgeon: Brian James Swinteck, MD;  Location: [facility] ORS;  Service: Orthopedics;  Laterality: Left;  . TUBAL LIGATION    . ULTRASOUND GUIDANCE FOR VASCULAR ACCESS  06/12/2018   Procedure: Ultrasound Guidance For Vascular Access;  Surgeon: Kelly, Thomas A, MD;  Location: [facility] INVASIVE CV [facility];  Service: Cardiovascular;;    History reviewed. No pertinent family history.  Social History:  reports that she quit smoking about 17 years ago. She has never used smokeless tobacco. She reports current alcohol use. She reports that she does not use drugs.  Allergies:  Allergies  Allergen Reactions  . Statins Other (See Comments)     " I cant recall the name of the statin, but I had joint pain with one of the statins "    Medications:  Scheduled: . aspirin EC  81 mg Oral Daily  . ezetimibe  10 mg Oral Daily  . fluticasone  1 spray Each Nare Daily  . folic acid  1 mg Oral Daily  . [START ON 06/14/2018] heparin-papaverine-plasmalyte irrigation   Irrigation To OR  . insulin aspart  0-9 Units Subcutaneous TID WC  . [  START ON 06/14/2018] insulin   Intravenous To OR  . loratadine  10 mg Oral Daily  . [START ON 06/14/2018] magnesium sulfate  40 mEq Other To OR  . metoprolol tartrate  25 mg Oral BID  . [START ON 06/14/2018] norepinephrine  0-40 mcg/min Intravenous To OR  . omega-3 acid ethyl esters  2 g Oral BID  . pantoprazole  40 mg Oral Daily  . [START ON 06/14/2018] phenylephrine  30-200 mcg/min Intravenous To OR  . [START ON 06/14/2018] potassium chloride  80 mEq Other To OR  . sodium chloride flush  3 mL Intravenous Q12H  . [START ON 06/14/2018]  tranexamic acid  15 mg/kg Intravenous To OR  . [START ON 06/14/2018] tranexamic acid  2 mg/kg Intracatheter To OR    Results for orders placed or performed during the hospital encounter of 06/12/18 (from the past 48 hour(s))  Glucose, capillary     Status: Abnormal   Collection Time: 06/12/18 12:48 AM  Result Value Ref Range   Glucose-Capillary 116 (H) 70 - 99 mg/dL  Lipid panel     Status: Abnormal   Collection Time: 06/12/18  4:43 AM  Result Value Ref Range   Cholesterol 351 (H) 0 - 200 mg/dL   Triglycerides 656 (H) <150 mg/dL   HDL 27 (L) >40 mg/dL   Total CHOL/HDL Ratio 13.0 RATIO   VLDL UNABLE TO CALCULATE IF TRIGLYCERIDE OVER 400 mg/dL 0 - 40 mg/dL   LDL Cholesterol UNABLE TO CALCULATE IF TRIGLYCERIDE OVER 400 mg/dL 0 - 99 mg/dL    Comment:        Total Cholesterol/HDL:CHD Risk Coronary Heart Disease Risk Table                     Men   Women  1/2 Average Risk   3.4   3.3  Average Risk       5.0   4.4  2 X Average Risk   9.6   7.1  3 X Average Risk  23.4   11.0        Use the calculated Patient Ratio above and the CHD Risk Table to determine the patient's CHD Risk.        ATP III CLASSIFICATION (LDL):  <100     mg/dL   Optimal  100-129  mg/dL   Near or Above                    Optimal  130-159  mg/dL   Borderline  160-189  mg/dL   High  >190     mg/dL   Very High Performed at [facility], [address] [place], [place] [address]   Hemoglobin A1c     Status: Abnormal   Collection Time: 06/12/18  4:43 AM  Result Value Ref Range   Hgb A1c MFr Bld 7.6 (H) 4.8 - 5.6 %    Comment: (NOTE) Pre diabetes:          5.7%-6.4% Diabetes:              >6.4% Glycemic control for   <7.0% adults with diabetes    Mean Plasma Glucose 171.42 mg/dL    Comment: Performed at [facility], [address] [place], [place] [address]  LDL cholesterol, direct     Status: Abnormal   Collection Time: 06/12/18  4:43 AM  Result Value Ref Range   Direct LDL 198 (H) 0 - 99  mg/dL     Comment: Comment     Comment: (NOTE) Possible Familial Hypercholesterolemia. FH should be suspected when fasting LDL cholesterol is above 189 mg/dL or non-HDL cholesterol is above 219 mg/dL. A family history of high cholesterol and heart disease in 1st degree relatives should be collected. J Clin Lipidol 2011;5:133-140 Performed At: [facility] [address] [place], [place] [id number] Nagendra Sanjai MD Ph:[phone]   HIV antibody (Routine Testing)     Status: None   Collection Time: 06/12/18  4:43 AM  Result Value Ref Range   HIV Screen 4th Generation wRfx Non Reactive Non Reactive    Comment: (NOTE) Performed At: [facility] [address] [place], [place] [id number] Nagendra Sanjai MD Ph:[phone]   CBC     Status: Abnormal   Collection Time: 06/12/18  4:43 AM  Result Value Ref Range   WBC 7.8 4.0 - 10.5 K/uL   RBC 4.01 3.87 - 5.11 MIL/uL   Hemoglobin 10.1 (L) 12.0 - 15.0 g/dL   HCT 33.6 (L) 36.0 - 46.0 %   MCV 83.8 80.0 - 100.0 fL   MCH 25.2 (L) 26.0 - 34.0 pg   MCHC 30.1 30.0 - 36.0 g/dL   RDW 15.3 11.5 - 15.5 %   Platelets 327 150 - 400 K/uL   nRBC 0.0 0.0 - 0.2 %    Comment: Performed at [facility], [address] [place], [place] [address]  Basic metabolic panel     Status: Abnormal   Collection Time: 06/12/18  4:43 AM  Result Value Ref Range   Sodium 139 135 - 145 mmol/L   Potassium 4.2 3.5 - 5.1 mmol/L   Chloride 110 98 - 111 mmol/L   CO2 20 (L) 22 - 32 mmol/L   Glucose, Bld 155 (H) 70 - 99 mg/dL   BUN 23 8 - 23 mg/dL   Creatinine, Ser 1.27 (H) 0.44 - 1.00 mg/dL   Calcium 9.4 8.9 - 10.3 mg/dL   GFR calc non Af Amer 45 (L) >60 mL/min   GFR calc Af Amer 52 (L) >60 mL/min   Anion gap 9 5 - 15    Comment: Performed at [facility], [address] [place], [place] [address]  Protime-INR     Status: None   Collection Time: 06/12/18  4:43 AM  Result Value Ref Range   Prothrombin Time 14.7 11.4 - 15.2 seconds   INR  1.16     Comment: Performed at [facility], [address] [place],  [address]  Glucose, capillary     Status: Abnormal   Collection Time: 06/12/18  7:40 AM  Result Value Ref Range   Glucose-Capillary 128 (H) 70 - 99 mg/dL  Glucose, capillary     Status: Abnormal   Collection Time: 06/12/18 11:46 AM  Result Value Ref Range   Glucose-Capillary 119 (H) 70 - 99 mg/dL  POCT Activated clotting time     Status: None   Collection Time: 06/12/18  4:45 PM  Result Value Ref Range   Activated Clotting Time 98 seconds  Glucose, capillary     Status: None   Collection Time: 06/12/18  5:15 PM  Result Value Ref Range   Glucose-Capillary 99 70 - 99 mg/dL  Glucose, capillary     Status: Abnormal   Collection Time: 06/12/18  6:22 PM  Result Value Ref Range   Glucose-Capillary 103 (H) 70 - 99 mg/dL   Comment 1 Notify RN    Comment 2 Document in   Chart   Glucose, capillary     Status: Abnormal   Collection Time: 06/12/18  9:30 PM  Result Value Ref Range   Glucose-Capillary 271 (H) 70 - 99 mg/dL  Basic metabolic panel     Status: Abnormal   Collection Time: 06/13/18  2:13 AM  Result Value Ref Range   Sodium 139 135 - 145 mmol/L   Potassium 4.3 3.5 - 5.1 mmol/L   Chloride 111 98 - 111 mmol/L   CO2 20 (L) 22 - 32 mmol/L   Glucose, Bld 131 (H) 70 - 99 mg/dL   BUN 15 8 - 23 mg/dL   Creatinine, Ser 1.26 (H) 0.44 - 1.00 mg/dL   Calcium 8.9 8.9 - 10.3 mg/dL   GFR calc non Af Amer 45 (L) >60 mL/min   GFR calc Af Amer 53 (L) >60 mL/min   Anion gap 8 5 - 15    Comment: Performed at [facility], [address] [place], [place] [address]  CBC     Status: Abnormal   Collection Time: 06/13/18  2:13 AM  Result Value Ref Range   WBC 7.3 4.0 - 10.5 K/uL   RBC 3.73 (L) 3.87 - 5.11 MIL/uL   Hemoglobin 9.5 (L) 12.0 - 15.0 g/dL   HCT 31.3 (L) 36.0 - 46.0 %   MCV 83.9 80.0 - 100.0 fL   MCH 25.5 (L) 26.0 - 34.0 pg   MCHC 30.4 30.0 - 36.0 g/dL   RDW 15.2 11.5 - 15.5 %   Platelets 298  150 - 400 K/uL   nRBC 0.0 0.0 - 0.2 %    Comment: Performed at [facility], [address] [place], [place] [address]  Heparin level (unfractionated)     Status: None   Collection Time: 06/13/18  7:48 AM  Result Value Ref Range   Heparin Unfractionated 0.39 0.30 - 0.70 IU/mL    Comment: (NOTE) If heparin results are below expected values, and patient dosage has  been confirmed, suggest follow up testing of antithrombin III levels. Performed at  Hospital [facility], [address] [place], [place] [address]   Glucose, capillary     Status: Abnormal   Collection Time: 06/13/18  7:57 AM  Result Value Ref Range   Glucose-Capillary 126 (H) 70 - 99 mg/dL  Glucose, capillary     Status: Abnormal   Collection Time: 06/13/18 12:22 PM  Result Value Ref Range   Glucose-Capillary 129 (H) 70 - 99 mg/dL    Vas Us Doppler Pre Cabg  Result Date: 06/13/2018 PREOPERATIVE VASCULAR EVALUATION  Risk Factors:     Hypertension, hyperlipidemia, Diabetes, coronary artery                   disease. Comparison Study: No prior study on file Performing Technologist: Kanady, Candace RVS  Examination Guidelines: A complete evaluation includes B-mode imaging, spectral Doppler, color Doppler, and power Doppler as needed of all accessible portions of each vessel. Bilateral testing is considered an integral part of a complete examination. Limited examinations for reoccurring indications may be performed as noted.  Right Carotid Findings: +----------+--------+--------+--------+-----------+------------------+           PSV cm/sEDV cm/sStenosisDescribe   Comments           +----------+--------+--------+--------+-----------+------------------+ CCA Prox  83      21              homogeneous                   +----------+--------+--------+--------+-----------+------------------+   CCA Distal80      25                         intimal thickening  +----------+--------+--------+--------+-----------+------------------+ ICA Prox  59      17              homogeneoustortuous           +----------+--------+--------+--------+-----------+------------------+ ICA Distal95      41                         tortuous           +----------+--------+--------+--------+-----------+------------------+ ECA       88      8                                             +----------+--------+--------+--------+-----------+------------------+ Portions of this table do not appear on this page. +----------+--------+-------+--------+------------+           PSV cm/sEDV cmsDescribeArm Pressure +----------+--------+-------+--------+------------+ Subclavian65                     149          +----------+--------+-------+--------+------------+ +---------+--------+--+--------+-+ VertebralPSV cm/s34EDV cm/s8 +---------+--------+--+--------+-+ Left Carotid Findings: +----------+--------+--------+--------+-----------+------------------+           PSV cm/sEDV cm/sStenosisDescribe   Comments           +----------+--------+--------+--------+-----------+------------------+ CCA Prox  124     28                         intimal thickening +----------+--------+--------+--------+-----------+------------------+ CCA Distal95      28                         intimal thickening +----------+--------+--------+--------+-----------+------------------+ ICA Prox  93      35              homogeneoustortuous           +----------+--------+--------+--------+-----------+------------------+ ICA Distal74      25                         tortuous           +----------+--------+--------+--------+-----------+------------------+ +----------+--------+--------+--------+------------+ SubclavianPSV cm/sEDV cm/sDescribeArm Pressure +----------+--------+--------+--------+------------+           90                      158           +----------+--------+--------+--------+------------+ +---------+--------+--+--------+--+ VertebralPSV cm/s67EDV cm/s27 +---------+--------+--+--------+--+  ABI Findings: +--------+------------------+-----+---------+--------+ Right   Rt Pressure (mmHg)IndexWaveform Comment  +--------+------------------+-----+---------+--------+ Brachial149                    triphasic         +--------+------------------+-----+---------+--------+ PTA     147               0.93 triphasic         +--------+------------------+-----+---------+--------+ DP      153               0.97 triphasic         +--------+------------------+-----+---------+--------+ +--------+------------------+-----+---------+-------+ Left    Lt Pressure (mmHg)IndexWaveform Comment +--------+------------------+-----+---------+-------+ Brachial158                      triphasic        +--------+------------------+-----+---------+-------+ PTA     164               1.04 triphasic        +--------+------------------+-----+---------+-------+ DP      169               1.07 triphasic        +--------+------------------+-----+---------+-------+ +-------+---------------+----------------+ ABI/TBIToday's ABI/TBIPrevious ABI/TBI +-------+---------------+----------------+ Right  0.97                            +-------+---------------+----------------+ Left   1.07                            +-------+---------------+----------------+  Right Doppler Findings: +-----------+--------+-----+---------+-----------------------------------------+ Site       PressureIndexDoppler  Comments                                  +-----------+--------+-----+---------+-----------------------------------------+ Brachial   149          triphasic                                          +-----------+--------+-----+---------+-----------------------------------------+ Radial                  triphasic                                           +-----------+--------+-----+---------+-----------------------------------------+ Ulnar                   triphasic                                          +-----------+--------+-----+---------+-----------------------------------------+ Palmar Arch                      Doppler signal remains normal with both                                    radial and ulnar compression              +-----------+--------+-----+---------+-----------------------------------------+  Left Doppler Findings: +-----------+--------+-----+---------+-----------------------------------------+ Site       PressureIndexDoppler  Comments                                  +-----------+--------+-----+---------+-----------------------------------------+ Brachial   158          triphasic                                          +-----------+--------+-----+---------+-----------------------------------------+ Radial                  triphasic                                          +-----------+--------+-----+---------+-----------------------------------------+   Ulnar                   triphasic                                          +-----------+--------+-----+---------+-----------------------------------------+ Palmar Arch                      Doppler signal obliterates with radial                                     compression and reverses with ulnar                                        compression                               +-----------+--------+-----+---------+-----------------------------------------+  Summary: Right Carotid: The extracranial vessels were near-normal with only minimal wall                thickening or plaque. Left Carotid: The extracranial vessels were near-normal with only minimal wall               thickening or plaque. Vertebrals:  Bilateral vertebral arteries demonstrate antegrade flow. Subclavians: Normal flow hemodynamics were seen in  bilateral subclavian              arteries. Right ABI: Resting right ankle-brachial index is within normal range. No evidence of significant right lower extremity arterial disease. Left ABI: Resting left ankle-brachial index is within normal range. No evidence of significant left lower extremity arterial disease. Right Upper Extremity: Doppler waveforms remain within normal limits with right radial compression. Doppler waveform obliterate with right ulnar compression. Left Upper Extremity: Doppler waveforms remain within normal limits with left radial compression. Doppler waveforms reverse with left ulnar compression.     Preliminary    ECHOCARDIOGRAM Study Conclusions  - Left ventricle: The cavity size was normal. Wall thickness was   normal. Systolic function was normal. The estimated ejection   fraction was in the range of 50% to 55%. Moderate hypokinesis of   the basalinferior myocardium. Features are consistent with a   pseudonormal left ventricular filling pattern, with concomitant   abnormal relaxation and increased filling pressure (grade 2   diastolic dysfunction).  CARDIAC CATHETERIZATION Conclusion     Ost RPDA to RPDA lesion is 99% stenosed.  Mid RCA lesion is 50% stenosed.  Mid RCA to Dist RCA lesion is 80% stenosed.  Dist LM to Prox LAD lesion is 90% stenosed.  Prox LAD lesion is 80% stenosed.  Mid LAD to Dist LAD lesion is 100% stenosed.  Ost Cx to Prox Cx lesion is 70% stenosed.   Severe multivessel CAD with 90% near ostial LAD stenosis followed by 80% stenosis after the first diagonal vessel which had 40% stenoses in the diagonal vessel.  The mid LAD is subtotal/totally occluded and there is collateralization from septal perforators supplying the apical portion of the LAD which wraps around the apex.  There is also collateralization to the distal RCA via the left coronary injection ;  70% proximal eccentric circumflex stenosis; right coronary artery with   diffuse mid  50% narrowing followed by diffuse 80% stenosis beyond the acute margin and 99% PDA stenosis with evidence for both antegrade and retrograde filling of the distal PDA.  Mild LV dysfunction with EF at 50% with focal mild basal inferior hypocontractility.  LVEDP 17 mm.  RECOMMENDATION: Surgical consultation for consideration of CABG revascularization.  In light of the patient's significant disease, she will be restarted on heparin 8 hours post sheath discontinuance.  If patient is intolerant to statins, consider alternative such as Zetia and possible PCSK9 inhibition.  I personally reviewed the cath images and concur with the findings noted above  Review of Systems  Constitutional: Positive for malaise/fatigue. Negative for chills and fever.  Respiratory: Positive for shortness of breath and wheezing.   Cardiovascular: Positive for chest pain. Negative for claudication and leg swelling.  Gastrointestinal: Positive for nausea and vomiting.  Genitourinary: Positive for urgency. Negative for dysuria.  Musculoskeletal: Positive for back pain and joint pain.  Neurological: Negative for focal weakness and loss of consciousness.  Psychiatric/Behavioral: The patient is nervous/anxious.    Blood pressure (!) 158/90, pulse 79, temperature 98.6 F (37 C), temperature source Oral, resp. rate 19, height 5' 3" (1.6 m), weight 65.5 kg, SpO2 99 %. Physical Exam  Vitals reviewed. Constitutional: She is oriented to person, place, and time. She appears well-developed and well-nourished. No distress.  HENT:  Head: Normocephalic and atraumatic.  Mouth/Throat: No oropharyngeal exudate.  Eyes: Pupils are equal, round, and reactive to light. Conjunctivae and EOM are normal. No scleral icterus.  Neck: Neck supple. No thyromegaly present.  No carotid bruits  Cardiovascular: Normal rate, regular rhythm and normal heart sounds. Exam reveals no gallop and no friction rub.  No murmur heard. Pulses:      Carotid  pulses are 2+ on the right side and 2+ on the left side.      Femoral pulses are 2+ on the right side and 2+ on the left side.      Dorsalis pedis pulses are 1+ on the right side and 1+ on the left side.       Posterior tibial pulses are 1+ on the right side and 1+ on the left side.  Respiratory: Effort normal and breath sounds normal. No respiratory distress. She has no wheezes. She has no rales.  GI: Soft. She exhibits no distension. There is no abdominal tenderness.  Musculoskeletal:        General: No edema.  Lymphadenopathy:    She has no cervical adenopathy.  Neurological: She is alert and oriented to person, place, and time. No cranial nerve deficit. She exhibits normal muscle tone. Coordination abnormal.  Skin: Skin is warm and dry.    Assessment/Plan: 62 yo woman with multiple CRF presents with class IV angina. Found to have a positive nuclear stress test. Cath reveals severe 3 vessel CAD with diffusely disease RCA system and distal LAD. CABG indicated for survival benefit and relief of symptoms.  I discussed with the patient the general nature of the procedure, the need for general anesthesia, the use of cardiopulmonary bypass, and the incisions to be used. We discussed the expected hospital stay, overall recovery and short and long term outcomes. Informed her of the indications, risks, benefits and alternatives.  She understands the risks include, but are not limited to death, stroke, MI, DVT/PE, bleeding, possible need for transfusion, infections, cardiac arrhythmias, as well as other organ system dysfunction including respiratory, renal, or GI complications. Higher risk for renal issues in   setting of CKD.  She accepts the risks and agrees to proceed.  Cassidy Green 06/13/2018, 2:02 PM     

## 2018-06-13 NOTE — Progress Notes (Signed)
ANTICOAGULATION CONSULT NOTE   Pharmacy Consult for heparin Indication: chest pain/ACS  Allergies  Allergen Reactions  . Statins     " I cant recall the name of the statin, but I had joint pain with one of the statins "    Patient Measurements: Height: 5\' 3"  (160 cm) Weight: 144 lb 6.4 oz (65.5 kg) IBW/kg (Calculated) : 52.4 Heparin Dosing Weight: 65kg  Vital Signs: Temp: 98.1 F (36.7 C) (01/16 0900) Temp Source: Oral (01/16 0900) BP: 161/83 (01/16 0900) Pulse Rate: 97 (01/16 0900)  Labs: Recent Labs    06/12/18 0443 06/13/18 0213 06/13/18 0748  HGB 10.1* 9.5*  --   HCT 33.6* 31.3*  --   PLT 327 298  --   LABPROT 14.7  --   --   INR 1.16  --   --   HEPARINUNFRC  --   --  0.39  CREATININE 1.27* 1.26*  --     Estimated Creatinine Clearance: 41.6 mL/min (A) (by C-G formula based on SCr of 1.26 mg/dL (H)).   Medical History: Past Medical History:  Diagnosis Date  . Anemia   . Anxiety   . Arthritis   . Diabetes mellitus without complication (HCC)    type II  . Female bladder prolapse   . GERD (gastroesophageal reflux disease)   . Headache   . History of bronchitis   . History of pneumonia   . Hyperlipidemia   . Hypertension   . Joint inflammation    Assessment: 63 year old female s/p cath this afternoon found to have multivessel CAD. New orders received to start IV heparin and await surgical consultation. CBC stable.  No overt bleeding or complications noted.   Heparin level at goal this AM.  Goal of Therapy:  Heparin level 0.3-0.7 units/ml Monitor platelets by anticoagulation protocol: Yes   Plan:  Continue IV heparin at current rate. Confirm heparin level in 8 hrs Daily heparin level and CBC.  Jenetta Downer, [facility] Clinical Pharmacist Phone [phone]  06/13/2018 10:17 AM

## 2018-06-13 NOTE — Progress Notes (Signed)
ANTICOAGULATION CONSULT NOTE   Pharmacy Consult for heparin Indication: chest pain/ACS  Allergies  Allergen Reactions  . Statins Other (See Comments)     " I cant recall the name of the statin, but I had joint pain with one of the statins "    Patient Measurements: Height: 5\' 3"  (160 cm) Weight: 144 lb 6.4 oz (65.5 kg) IBW/kg (Calculated) : 52.4 Heparin Dosing Weight: 65kg  Vital Signs: Temp: 97.7 F (36.5 C) (01/16 1600) Temp Source: Oral (01/16 1600) BP: 144/79 (01/16 1600) Pulse Rate: 87 (01/16 1600)  Labs: Recent Labs    06/12/18 0443 06/13/18 0213 06/13/18 0748 06/13/18 1629  HGB 10.1* 9.5*  --   --   HCT 33.6* 31.3*  --   --   PLT 327 298  --   --   LABPROT 14.7  --   --   --   INR 1.16  --   --   --   HEPARINUNFRC  --   --  0.39 0.41  CREATININE 1.27* 1.26*  --   --     Estimated Creatinine Clearance: 41.6 mL/min (A) (by C-G formula based on SCr of 1.26 mg/dL (H)).   Medical History: Past Medical History:  Diagnosis Date  . Anemia   . Anxiety   . Arthritis   . Diabetes mellitus without complication (HCC)    type II  . Female bladder prolapse   . GERD (gastroesophageal reflux disease)   . Headache   . History of bronchitis   . History of pneumonia   . Hyperlipidemia   . Hypertension   . Joint inflammation    Assessment: 63 year old female s/p cath this afternoon found to have multivessel CAD. New orders received to start IV heparin and await surgical consultation. CBC stable.  No overt bleeding or complications noted.   Repeat heparin level at goal this afternoon. No changes warranted.   Goal of Therapy:  Heparin level 0.3-0.7 units/ml Monitor platelets by anticoagulation protocol: Yes   Plan:  Continue IV heparin at current rate. Planning CABG in am  Sheppard Coil PharmD., BCPS Clinical Pharmacist 06/13/2018 7:01 PM

## 2018-06-14 ENCOUNTER — Inpatient Hospital Stay (HOSPITAL_COMMUNITY): Payer: Managed Care, Other (non HMO) | Admitting: Certified Registered Nurse Anesthetist

## 2018-06-14 ENCOUNTER — Inpatient Hospital Stay (HOSPITAL_COMMUNITY): Payer: Managed Care, Other (non HMO)

## 2018-06-14 ENCOUNTER — Encounter (HOSPITAL_COMMUNITY)
Admission: AD | Disposition: A | Discharge status: Home / Self Care | Payer: Self-pay | Source: Other Acute Inpatient Hospital | Attending: Internal Medicine

## 2018-06-14 ENCOUNTER — Encounter (HOSPITAL_COMMUNITY): Payer: Self-pay | Admitting: *Deleted

## 2018-06-14 HISTORY — PX: CORONARY ARTERY BYPASS GRAFT: SHX141

## 2018-06-14 HISTORY — PX: TEE WITHOUT CARDIOVERSION: SHX5443

## 2018-06-14 LAB — POCT I-STAT 3, ART BLOOD GAS (G3+)
Acid-base deficit: 3 mmol/L — ABNORMAL HIGH (ref 0.0–2.0)
Acid-base deficit: 1 mmol/L (ref 0.0–2.0)
Acid-base deficit: 6 mmol/L — ABNORMAL HIGH (ref 0.0–2.0)
Acid-base deficit: 1 mmol/L (ref 0.0–2.0)
Acid-base deficit: 1 mmol/L (ref 0.0–2.0)
BICARBONATE: 24.3 mmol/L (ref 20.0–28.0)
Bicarbonate: 22 mmol/L (ref 20.0–28.0)
Bicarbonate: 23.1 mmol/L (ref 20.0–28.0)
Bicarbonate: 20.3 mmol/L (ref 20.0–28.0)
Bicarbonate: 25.8 mmol/L (ref 20.0–28.0)
Bicarbonate: 25.3 mmol/L (ref 20.0–28.0)
Bicarbonate: 23.9 mmol/L (ref 20.0–28.0)
O2 SAT: 92 %
O2 SAT: 89 %
O2 Saturation: 100 %
O2 Saturation: 99 %
O2 Saturation: 93 %
O2 Saturation: 89 %
O2 Saturation: 87 %
PH ART: 7.435 (ref 7.350–7.450)
PO2 ART: 57 mmHg — AB (ref 83.0–108.0)
PO2 ART: 54 mmHg — AB (ref 83.0–108.0)
Patient temperature: 34.4
Patient temperature: 35.4
TCO2: 23 mmol/L (ref 22–32)
TCO2: 24 mmol/L (ref 22–32)
TCO2: 22 mmol/L (ref 22–32)
TCO2: 27 mmol/L (ref 22–32)
TCO2: 27 mmol/L (ref 22–32)
TCO2: 25 mmol/L (ref 22–32)
TCO2: 26 mmol/L (ref 22–32)
pCO2 arterial: 37.7 mmHg (ref 32.0–48.0)
pCO2 arterial: 34.4 mmHg (ref 32.0–48.0)
pCO2 arterial: 36.5 mmHg (ref 32.0–48.0)
pCO2 arterial: 41.1 mmHg (ref 32.0–48.0)
pCO2 arterial: 41.5 mmHg (ref 32.0–48.0)
pCO2 arterial: 39.7 mmHg (ref 32.0–48.0)
pCO2 arterial: 41.9 mmHg (ref 32.0–48.0)
pH, Arterial: 7.374 (ref 7.350–7.450)
pH, Arterial: 7.341 — ABNORMAL LOW (ref 7.350–7.450)
pH, Arterial: 7.398 (ref 7.350–7.450)
pH, Arterial: 7.393 (ref 7.350–7.450)
pH, Arterial: 7.387 (ref 7.350–7.450)
pH, Arterial: 7.372 (ref 7.350–7.450)
pO2, Arterial: 396 mmHg — ABNORMAL HIGH (ref 83.0–108.0)
pO2, Arterial: 118 mmHg — ABNORMAL HIGH (ref 83.0–108.0)
pO2, Arterial: 61 mmHg — ABNORMAL LOW (ref 83.0–108.0)
pO2, Arterial: 52 mmHg — ABNORMAL LOW (ref 83.0–108.0)
pO2, Arterial: 65 mmHg — ABNORMAL LOW (ref 83.0–108.0)

## 2018-06-14 LAB — PROTIME-INR
INR: 1.29
Prothrombin Time: 15.9 seconds — ABNORMAL HIGH (ref 11.4–15.2)

## 2018-06-14 LAB — CBC
HCT: 30.3 % — ABNORMAL LOW (ref 36.0–46.0)
HCT: 38.3 % (ref 36.0–46.0)
HCT: 36.1 % (ref 36.0–46.0)
HEMOGLOBIN: 12.4 g/dL (ref 12.0–15.0)
Hemoglobin: 9.1 g/dL — ABNORMAL LOW (ref 12.0–15.0)
Hemoglobin: 11.4 g/dL — ABNORMAL LOW (ref 12.0–15.0)
MCH: 25.5 pg — ABNORMAL LOW (ref 26.0–34.0)
MCH: 27.9 pg (ref 26.0–34.0)
MCH: 26.6 pg (ref 26.0–34.0)
MCHC: 30 g/dL (ref 30.0–36.0)
MCHC: 32.4 g/dL (ref 30.0–36.0)
MCHC: 31.6 g/dL (ref 30.0–36.0)
MCV: 84.9 fL (ref 80.0–100.0)
MCV: 86.1 fL (ref 80.0–100.0)
MCV: 84.3 fL (ref 80.0–100.0)
PLATELETS: 282 10*3/uL (ref 150–400)
Platelets: 150 10*3/uL (ref 150–400)
Platelets: 157 10*3/uL (ref 150–400)
RBC: 3.57 MIL/uL — ABNORMAL LOW (ref 3.87–5.11)
RBC: 4.45 MIL/uL (ref 3.87–5.11)
RBC: 4.28 MIL/uL (ref 3.87–5.11)
RDW: 15.3 % (ref 11.5–15.5)
RDW: 14.9 % (ref 11.5–15.5)
RDW: 14.7 % (ref 11.5–15.5)
WBC: 6.4 10*3/uL (ref 4.0–10.5)
WBC: 13.6 10*3/uL — ABNORMAL HIGH (ref 4.0–10.5)
WBC: 14.4 10*3/uL — ABNORMAL HIGH (ref 4.0–10.5)
nRBC: 0 % (ref 0.0–0.2)
nRBC: 0 % (ref 0.0–0.2)
nRBC: 0 % (ref 0.0–0.2)

## 2018-06-14 LAB — POCT I-STAT, CHEM 8
BUN: 14 mg/dL (ref 8–23)
BUN: 13 mg/dL (ref 8–23)
BUN: 11 mg/dL (ref 8–23)
BUN: 13 mg/dL (ref 8–23)
BUN: 12 mg/dL (ref 8–23)
BUN: 11 mg/dL (ref 8–23)
BUN: 11 mg/dL (ref 8–23)
BUN: 9 mg/dL (ref 8–23)
CALCIUM ION: 1.28 mmol/L (ref 1.15–1.40)
CREATININE: 0.8 mg/dL (ref 0.44–1.00)
Calcium, Ion: 1.26 mmol/L (ref 1.15–1.40)
Calcium, Ion: 0.99 mmol/L — ABNORMAL LOW (ref 1.15–1.40)
Calcium, Ion: 1.27 mmol/L (ref 1.15–1.40)
Calcium, Ion: 1.04 mmol/L — ABNORMAL LOW (ref 1.15–1.40)
Calcium, Ion: 1.05 mmol/L — ABNORMAL LOW (ref 1.15–1.40)
Calcium, Ion: 1.06 mmol/L — ABNORMAL LOW (ref 1.15–1.40)
Calcium, Ion: 1.08 mmol/L — ABNORMAL LOW (ref 1.15–1.40)
Chloride: 111 mmol/L (ref 98–111)
Chloride: 112 mmol/L — ABNORMAL HIGH (ref 98–111)
Chloride: 108 mmol/L (ref 98–111)
Chloride: 111 mmol/L (ref 98–111)
Chloride: 106 mmol/L (ref 98–111)
Chloride: 108 mmol/L (ref 98–111)
Chloride: 110 mmol/L (ref 98–111)
Chloride: 107 mmol/L (ref 98–111)
Creatinine, Ser: 1.2 mg/dL — ABNORMAL HIGH (ref 0.44–1.00)
Creatinine, Ser: 1.1 mg/dL — ABNORMAL HIGH (ref 0.44–1.00)
Creatinine, Ser: 0.9 mg/dL (ref 0.44–1.00)
Creatinine, Ser: 1 mg/dL (ref 0.44–1.00)
Creatinine, Ser: 0.9 mg/dL (ref 0.44–1.00)
Creatinine, Ser: 0.8 mg/dL (ref 0.44–1.00)
Creatinine, Ser: 0.8 mg/dL (ref 0.44–1.00)
GLUCOSE: 111 mg/dL — AB (ref 70–99)
Glucose, Bld: 128 mg/dL — ABNORMAL HIGH (ref 70–99)
Glucose, Bld: 120 mg/dL — ABNORMAL HIGH (ref 70–99)
Glucose, Bld: 113 mg/dL — ABNORMAL HIGH (ref 70–99)
Glucose, Bld: 122 mg/dL — ABNORMAL HIGH (ref 70–99)
Glucose, Bld: 126 mg/dL — ABNORMAL HIGH (ref 70–99)
Glucose, Bld: 135 mg/dL — ABNORMAL HIGH (ref 70–99)
Glucose, Bld: 147 mg/dL — ABNORMAL HIGH (ref 70–99)
HCT: 24 % — ABNORMAL LOW (ref 36.0–46.0)
HCT: 21 % — ABNORMAL LOW (ref 36.0–46.0)
HCT: 22 % — ABNORMAL LOW (ref 36.0–46.0)
HCT: 26 % — ABNORMAL LOW (ref 36.0–46.0)
HCT: 29 % — ABNORMAL LOW (ref 36.0–46.0)
HCT: 34 % — ABNORMAL LOW (ref 36.0–46.0)
HEMATOCRIT: 21 % — AB (ref 36.0–46.0)
HEMATOCRIT: 23 % — AB (ref 36.0–46.0)
HEMOGLOBIN: 7.1 g/dL — AB (ref 12.0–15.0)
Hemoglobin: 8.2 g/dL — ABNORMAL LOW (ref 12.0–15.0)
Hemoglobin: 7.1 g/dL — ABNORMAL LOW (ref 12.0–15.0)
Hemoglobin: 7.5 g/dL — ABNORMAL LOW (ref 12.0–15.0)
Hemoglobin: 7.8 g/dL — ABNORMAL LOW (ref 12.0–15.0)
Hemoglobin: 8.8 g/dL — ABNORMAL LOW (ref 12.0–15.0)
Hemoglobin: 9.9 g/dL — ABNORMAL LOW (ref 12.0–15.0)
Hemoglobin: 11.6 g/dL — ABNORMAL LOW (ref 12.0–15.0)
POTASSIUM: 5.2 mmol/L — AB (ref 3.5–5.1)
Potassium: 4.4 mmol/L (ref 3.5–5.1)
Potassium: 4 mmol/L (ref 3.5–5.1)
Potassium: 3.9 mmol/L (ref 3.5–5.1)
Potassium: 4.4 mmol/L (ref 3.5–5.1)
Potassium: 5.8 mmol/L — ABNORMAL HIGH (ref 3.5–5.1)
Potassium: 4.9 mmol/L (ref 3.5–5.1)
Potassium: 3.8 mmol/L (ref 3.5–5.1)
SODIUM: 142 mmol/L (ref 135–145)
SODIUM: 142 mmol/L (ref 135–145)
Sodium: 141 mmol/L (ref 135–145)
Sodium: 143 mmol/L (ref 135–145)
Sodium: 140 mmol/L (ref 135–145)
Sodium: 141 mmol/L (ref 135–145)
Sodium: 141 mmol/L (ref 135–145)
Sodium: 141 mmol/L (ref 135–145)
TCO2: 21 mmol/L — ABNORMAL LOW (ref 22–32)
TCO2: 21 mmol/L — AB (ref 22–32)
TCO2: 22 mmol/L (ref 22–32)
TCO2: 24 mmol/L (ref 22–32)
TCO2: 25 mmol/L (ref 22–32)
TCO2: 20 mmol/L — ABNORMAL LOW (ref 22–32)
TCO2: 22 mmol/L (ref 22–32)
TCO2: 24 mmol/L (ref 22–32)

## 2018-06-14 LAB — BLOOD GAS, ARTERIAL
Acid-base deficit: 3.5 mmol/L — ABNORMAL HIGH (ref 0.0–2.0)
Bicarbonate: 20.5 mmol/L (ref 20.0–28.0)
Drawn by: 313941
FIO2: 21
O2 Saturation: 95.5 %
PO2 ART: 78.2 mmHg — AB (ref 83.0–108.0)
Patient temperature: 98
pCO2 arterial: 33.6 mmHg (ref 32.0–48.0)
pH, Arterial: 7.4 (ref 7.350–7.450)

## 2018-06-14 LAB — PREPARE RBC (CROSSMATCH)

## 2018-06-14 LAB — POCT I-STAT 4, (NA,K, GLUC, HGB,HCT)
Glucose, Bld: 144 mg/dL — ABNORMAL HIGH (ref 70–99)
HCT: 35 % — ABNORMAL LOW (ref 36.0–46.0)
Hemoglobin: 11.9 g/dL — ABNORMAL LOW (ref 12.0–15.0)
Potassium: 4.3 mmol/L (ref 3.5–5.1)
Sodium: 144 mmol/L (ref 135–145)

## 2018-06-14 LAB — BASIC METABOLIC PANEL
ANION GAP: 9 (ref 5–15)
BUN: 15 mg/dL (ref 8–23)
CALCIUM: 9.2 mg/dL (ref 8.9–10.3)
CO2: 20 mmol/L — ABNORMAL LOW (ref 22–32)
Chloride: 113 mmol/L — ABNORMAL HIGH (ref 98–111)
Creatinine, Ser: 1.43 mg/dL — ABNORMAL HIGH (ref 0.44–1.00)
GFR calc Af Amer: 45 mL/min — ABNORMAL LOW (ref >60)
GFR, EST NON AFRICAN AMERICAN: 39 mL/min — AB (ref >60)
Glucose, Bld: 136 mg/dL — ABNORMAL HIGH (ref 70–99)
Potassium: 4.6 mmol/L (ref 3.5–5.1)
Sodium: 142 mmol/L (ref 135–145)

## 2018-06-14 LAB — CREATININE, SERUM
CREATININE: 0.98 mg/dL (ref 0.44–1.00)
GFR calc Af Amer: >60 mL/min (ref >60)
GFR calc non Af Amer: >60 mL/min (ref >60)

## 2018-06-14 LAB — GLUCOSE, CAPILLARY
Glucose-Capillary: 147 mg/dL — ABNORMAL HIGH (ref 70–99)
Glucose-Capillary: 144 mg/dL — ABNORMAL HIGH (ref 70–99)
Glucose-Capillary: 159 mg/dL — ABNORMAL HIGH (ref 70–99)
Glucose-Capillary: 127 mg/dL — ABNORMAL HIGH (ref 70–99)
Glucose-Capillary: 117 mg/dL — ABNORMAL HIGH (ref 70–99)
Glucose-Capillary: 118 mg/dL — ABNORMAL HIGH (ref 70–99)
Glucose-Capillary: 123 mg/dL — ABNORMAL HIGH (ref 70–99)

## 2018-06-14 LAB — SURGICAL PCR SCREEN
MRSA, PCR: NEGATIVE
Staphylococcus aureus: NEGATIVE

## 2018-06-14 LAB — URINALYSIS, ROUTINE W REFLEX MICROSCOPIC
Bilirubin Urine: NEGATIVE
Glucose, UA: NEGATIVE mg/dL
Hgb urine dipstick: NEGATIVE
Ketones, ur: NEGATIVE mg/dL
Leukocytes, UA: NEGATIVE
Nitrite: NEGATIVE
Protein, ur: NEGATIVE mg/dL
Specific Gravity, Urine: 1.006 (ref 1.005–1.030)
pH: 6 (ref 5.0–8.0)

## 2018-06-14 LAB — HEMOGLOBIN AND HEMATOCRIT, BLOOD
HCT: 26 % — ABNORMAL LOW (ref 36.0–46.0)
Hemoglobin: 8.5 g/dL — ABNORMAL LOW (ref 12.0–15.0)

## 2018-06-14 LAB — PLATELET COUNT: Platelets: 154 10*3/uL (ref 150–400)

## 2018-06-14 LAB — APTT: APTT: 31 s (ref 24–36)

## 2018-06-14 LAB — MAGNESIUM: Magnesium: 2.7 mg/dL — ABNORMAL HIGH (ref 1.7–2.4)

## 2018-06-14 SURGERY — CORONARY ARTERY BYPASS GRAFTING (CABG)
Anesthesia: General | Site: Chest

## 2018-06-14 MED ORDER — METOPROLOL TARTRATE 25 MG/10 ML ORAL SUSPENSION
12.5000 mg | Freq: Two times a day (BID) | ORAL | Status: DC
  Administered 2018-06-14: 12.5 mg
  Filled 2018-06-14: qty 5

## 2018-06-14 MED ORDER — DOPAMINE-DEXTROSE 3.2-5 MG/ML-% IV SOLN
INTRAVENOUS | Status: DC | PRN
  Administered 2018-06-14: 3 ug/kg/min via INTRAVENOUS

## 2018-06-14 MED ORDER — NITROGLYCERIN IN D5W 200-5 MCG/ML-% IV SOLN
0.0000 ug/min | INTRAVENOUS | Status: DC
  Administered 2018-06-15: 60 ug/min via INTRAVENOUS
  Filled 2018-06-14: qty 250

## 2018-06-14 MED ORDER — LACTATED RINGERS IV SOLN
INTRAVENOUS | Status: DC | PRN
  Administered 2018-06-14 (×3): via INTRAVENOUS

## 2018-06-14 MED ORDER — SODIUM CHLORIDE 0.9 % IV SOLN
1.5000 g | Freq: Two times a day (BID) | INTRAVENOUS | Status: AC
  Administered 2018-06-15 – 2018-06-16 (×4): 1.5 g via INTRAVENOUS
  Filled 2018-06-14 (×4): qty 1.5

## 2018-06-14 MED ORDER — SODIUM CHLORIDE 0.9 % IV SOLN
INTRAVENOUS | Status: DC
  Administered 2018-06-14: 16:00:00 via INTRAVENOUS

## 2018-06-14 MED ORDER — ORAL CARE MOUTH RINSE
15.0000 mL | Freq: Two times a day (BID) | OROMUCOSAL | Status: DC
  Administered 2018-06-15 – 2018-06-20 (×8): 15 mL via OROMUCOSAL

## 2018-06-14 MED ORDER — FENTANYL CITRATE (PF) 100 MCG/2ML IJ SOLN
INTRAMUSCULAR | Status: AC
  Administered 2018-06-14: 50 ug via INTRAVENOUS
  Filled 2018-06-14: qty 2

## 2018-06-14 MED ORDER — METOCLOPRAMIDE HCL 5 MG/ML IJ SOLN
10.0000 mg | Freq: Four times a day (QID) | INTRAMUSCULAR | Status: AC
  Administered 2018-06-14 – 2018-06-15 (×4): 10 mg via INTRAVENOUS
  Filled 2018-06-14 (×4): qty 2

## 2018-06-14 MED ORDER — FENTANYL CITRATE (PF) 250 MCG/5ML IJ SOLN
INTRAMUSCULAR | Status: AC
  Filled 2018-06-14: qty 5

## 2018-06-14 MED ORDER — BISACODYL 10 MG RE SUPP: 10.0000 mg | Freq: Every day | RECTAL | Status: DC

## 2018-06-14 MED ORDER — SODIUM CHLORIDE 0.9% FLUSH
10.0000 mL | Freq: Two times a day (BID) | INTRAVENOUS | Status: DC
  Administered 2018-06-14 – 2018-06-16 (×5): 10 mL

## 2018-06-14 MED ORDER — PROPOFOL 10 MG/ML IV BOLUS
INTRAVENOUS | Status: DC | PRN
  Administered 2018-06-14: 20 mg via INTRAVENOUS
  Administered 2018-06-14: 50 mg via INTRAVENOUS

## 2018-06-14 MED ORDER — BISACODYL 5 MG PO TBEC
10.0000 mg | DELAYED_RELEASE_TABLET | Freq: Every day | ORAL | Status: DC
  Administered 2018-06-15 – 2018-06-18 (×4): 10 mg via ORAL
  Filled 2018-06-14 (×6): qty 2

## 2018-06-14 MED ORDER — PHENYLEPHRINE 40 MCG/ML (10ML) SYRINGE FOR IV PUSH (FOR BLOOD PRESSURE SUPPORT)
PREFILLED_SYRINGE | INTRAVENOUS | Status: AC
  Filled 2018-06-14: qty 10

## 2018-06-14 MED ORDER — INSULIN REGULAR(HUMAN) IN NACL 100-0.9 UT/100ML-% IV SOLN: INTRAVENOUS | Status: DC

## 2018-06-14 MED ORDER — MIDAZOLAM HCL 5 MG/5ML IJ SOLN
INTRAMUSCULAR | Status: DC | PRN
  Administered 2018-06-14: 1 mg via INTRAVENOUS
  Administered 2018-06-14: 2 mg via INTRAVENOUS
  Administered 2018-06-14: 1 mg via INTRAVENOUS
  Administered 2018-06-14: 2 mg via INTRAVENOUS
  Administered 2018-06-14: 1 mg via INTRAVENOUS
  Administered 2018-06-14: 2 mg via INTRAVENOUS

## 2018-06-14 MED ORDER — ACETAMINOPHEN 160 MG/5ML PO SOLN: 1000.0000 mg | Freq: Four times a day (QID) | ORAL | Status: AC

## 2018-06-14 MED ORDER — ACETAMINOPHEN 500 MG PO TABS
1000.0000 mg | ORAL_TABLET | Freq: Four times a day (QID) | ORAL | Status: AC
  Administered 2018-06-14 – 2018-06-19 (×19): 1000 mg via ORAL
  Filled 2018-06-14 (×19): qty 2

## 2018-06-14 MED ORDER — METOPROLOL TARTRATE 5 MG/5ML IV SOLN
2.5000 mg | INTRAVENOUS | Status: DC | PRN
  Administered 2018-06-14: 2.5 mg via INTRAVENOUS
  Administered 2018-06-15 – 2018-06-16 (×8): 5 mg via INTRAVENOUS
  Filled 2018-06-14 (×9): qty 5

## 2018-06-14 MED ORDER — MIDAZOLAM HCL (PF) 10 MG/2ML IJ SOLN
INTRAMUSCULAR | Status: AC
  Filled 2018-06-14: qty 2

## 2018-06-14 MED ORDER — ROCURONIUM BROMIDE 50 MG/5ML IV SOSY
PREFILLED_SYRINGE | INTRAVENOUS | Status: AC
  Filled 2018-06-14: qty 10

## 2018-06-14 MED ORDER — DEXAMETHASONE SODIUM PHOSPHATE 10 MG/ML IJ SOLN
INTRAMUSCULAR | Status: AC
  Filled 2018-06-14: qty 1

## 2018-06-14 MED ORDER — PHENYLEPHRINE HCL 10 MG/ML IJ SOLN
INTRAMUSCULAR | Status: DC | PRN
  Administered 2018-06-14: 40 ug via INTRAVENOUS

## 2018-06-14 MED ORDER — SODIUM CHLORIDE 0.9% FLUSH
3.0000 mL | Freq: Two times a day (BID) | INTRAVENOUS | Status: DC
  Administered 2018-06-15: 3 mL via INTRAVENOUS
  Administered 2018-06-15: 10 mL via INTRAVENOUS
  Administered 2018-06-16 (×2): 3 mL via INTRAVENOUS

## 2018-06-14 MED ORDER — FENTANYL CITRATE (PF) 250 MCG/5ML IJ SOLN
INTRAMUSCULAR | Status: AC
  Filled 2018-06-14: qty 20

## 2018-06-14 MED ORDER — MIDAZOLAM HCL 2 MG/2ML IJ SOLN: 2.0000 mg | INTRAMUSCULAR | Status: DC | PRN

## 2018-06-14 MED ORDER — LACTATED RINGERS IV SOLN
INTRAVENOUS | Status: DC
  Administered 2018-06-14: 10:00:00 via INTRAVENOUS

## 2018-06-14 MED ORDER — HEMOSTATIC AGENTS (NO CHARGE) OPTIME
TOPICAL | Status: DC | PRN
  Administered 2018-06-14: 1 via TOPICAL

## 2018-06-14 MED ORDER — ALBUMIN HUMAN 5 % IV SOLN
INTRAVENOUS | Status: DC | PRN
  Administered 2018-06-14: 15:00:00 via INTRAVENOUS

## 2018-06-14 MED ORDER — LACTATED RINGERS IV SOLN: 500.0000 mL | Freq: Once | INTRAVENOUS | Status: DC | PRN

## 2018-06-14 MED ORDER — LIDOCAINE 2% (20 MG/ML) 5 ML SYRINGE
INTRAMUSCULAR | Status: AC
  Filled 2018-06-14: qty 5

## 2018-06-14 MED ORDER — OXYCODONE HCL 5 MG PO TABS
5.0000 mg | ORAL_TABLET | ORAL | Status: DC | PRN
  Administered 2018-06-14 – 2018-06-16 (×3): 10 mg via ORAL
  Filled 2018-06-14 (×3): qty 2

## 2018-06-14 MED ORDER — PROPOFOL 10 MG/ML IV BOLUS
INTRAVENOUS | Status: AC
  Filled 2018-06-14: qty 40

## 2018-06-14 MED ORDER — PROTAMINE SULFATE 10 MG/ML IV SOLN
INTRAVENOUS | Status: DC | PRN
  Administered 2018-06-14 (×4): 20 mg via INTRAVENOUS
  Administered 2018-06-14: 10 mg via INTRAVENOUS
  Administered 2018-06-14: 30 mg via INTRAVENOUS

## 2018-06-14 MED ORDER — SODIUM CHLORIDE 0.9% FLUSH: 10.0000 mL | INTRAVENOUS | Status: DC | PRN

## 2018-06-14 MED ORDER — FENTANYL CITRATE (PF) 100 MCG/2ML IJ SOLN
50.0000 ug | Freq: Once | INTRAMUSCULAR | Status: AC
  Administered 2018-06-14: 50 ug via INTRAVENOUS

## 2018-06-14 MED ORDER — CHLORHEXIDINE GLUCONATE 0.12 % MT SOLN
15.0000 mL | OROMUCOSAL | Status: AC
  Administered 2018-06-14: 15 mL via OROMUCOSAL

## 2018-06-14 MED ORDER — PANTOPRAZOLE SODIUM 40 MG PO TBEC
40.0000 mg | DELAYED_RELEASE_TABLET | Freq: Every day | ORAL | Status: DC
  Administered 2018-06-15 – 2018-06-21 (×7): 40 mg via ORAL
  Filled 2018-06-14 (×7): qty 1

## 2018-06-14 MED ORDER — ONDANSETRON HCL 4 MG/2ML IJ SOLN
4.0000 mg | Freq: Four times a day (QID) | INTRAMUSCULAR | Status: DC | PRN
  Administered 2018-06-15 (×2): 4 mg via INTRAVENOUS
  Filled 2018-06-14 (×2): qty 2

## 2018-06-14 MED ORDER — SODIUM CHLORIDE (PF) 0.9 % IJ SOLN
OROMUCOSAL | Status: DC | PRN
  Administered 2018-06-14 (×3): 4 mL via TOPICAL

## 2018-06-14 MED ORDER — TRAMADOL HCL 50 MG PO TABS: 50.0000 mg | ORAL_TABLET | ORAL | Status: DC | PRN

## 2018-06-14 MED ORDER — TRANEXAMIC ACID 1000 MG/10ML IV SOLN
INTRAVENOUS | Status: DC | PRN
  Administered 2018-06-14: 1.5 mg/kg/h via INTRAVENOUS

## 2018-06-14 MED ORDER — ASPIRIN EC 325 MG PO TBEC
325.0000 mg | DELAYED_RELEASE_TABLET | Freq: Every day | ORAL | Status: DC
  Administered 2018-06-15 – 2018-06-21 (×7): 325 mg via ORAL
  Filled 2018-06-14 (×7): qty 1

## 2018-06-14 MED ORDER — SODIUM CHLORIDE 0.9% IV SOLUTION: Freq: Once | INTRAVENOUS | Status: DC

## 2018-06-14 MED ORDER — 0.9 % SODIUM CHLORIDE (POUR BTL) OPTIME
TOPICAL | Status: DC | PRN
  Administered 2018-06-14: 5000 mL

## 2018-06-14 MED ORDER — ACETAMINOPHEN 650 MG RE SUPP
650.0000 mg | Freq: Once | RECTAL | Status: AC
  Administered 2018-06-14: 650 mg via RECTAL

## 2018-06-14 MED ORDER — ALBUMIN HUMAN 5 % IV SOLN
250.0000 mL | INTRAVENOUS | Status: DC | PRN
  Administered 2018-06-14 (×2): 12.5 g via INTRAVENOUS

## 2018-06-14 MED ORDER — CHLORHEXIDINE GLUCONATE CLOTH 2 % EX PADS
6.0000 | MEDICATED_PAD | Freq: Every day | CUTANEOUS | Status: DC
  Administered 2018-06-14 – 2018-06-16 (×3): 6 via TOPICAL

## 2018-06-14 MED ORDER — POTASSIUM CHLORIDE 10 MEQ/50ML IV SOLN: 10.0000 meq | INTRAVENOUS | Status: AC

## 2018-06-14 MED ORDER — CHLORHEXIDINE GLUCONATE 4 % EX LIQD
CUTANEOUS | Status: AC
  Administered 2018-06-14: 06:00:00
  Filled 2018-06-14: qty 15

## 2018-06-14 MED ORDER — SODIUM CHLORIDE 0.45 % IV SOLN
INTRAVENOUS | Status: DC | PRN
  Administered 2018-06-14: 16:00:00 via INTRAVENOUS

## 2018-06-14 MED ORDER — LACTATED RINGERS IV SOLN
INTRAVENOUS | Status: DC
  Administered 2018-06-14: 16:00:00 via INTRAVENOUS

## 2018-06-14 MED ORDER — METOPROLOL TARTRATE 12.5 MG HALF TABLET
12.5000 mg | ORAL_TABLET | Freq: Two times a day (BID) | ORAL | Status: DC
  Administered 2018-06-15: 12.5 mg via ORAL
  Filled 2018-06-14 (×2): qty 1

## 2018-06-14 MED ORDER — DOCUSATE SODIUM 100 MG PO CAPS
200.0000 mg | ORAL_CAPSULE | Freq: Every day | ORAL | Status: DC
  Administered 2018-06-15 – 2018-06-21 (×6): 200 mg via ORAL
  Filled 2018-06-14 (×7): qty 2

## 2018-06-14 MED ORDER — SODIUM CHLORIDE (PF) 0.9 % IJ SOLN
INTRAMUSCULAR | Status: AC
  Filled 2018-06-14: qty 10

## 2018-06-14 MED ORDER — PROTAMINE SULFATE 10 MG/ML IV SOLN
INTRAVENOUS | Status: AC
  Filled 2018-06-14: qty 25

## 2018-06-14 MED ORDER — ORAL CARE MOUTH RINSE: 15.0000 mL | OROMUCOSAL | Status: DC

## 2018-06-14 MED ORDER — LACTATED RINGERS IV SOLN
INTRAVENOUS | Status: DC
  Administered 2018-06-15: 22:00:00 via INTRAVENOUS

## 2018-06-14 MED ORDER — SODIUM CHLORIDE 0.9 % IV SOLN
250.0000 mL | INTRAVENOUS | Status: DC
  Administered 2018-06-15: 250 mL via INTRAVENOUS

## 2018-06-14 MED ORDER — HEPARIN SODIUM (PORCINE) 1000 UNIT/ML IJ SOLN
INTRAMUSCULAR | Status: AC
  Filled 2018-06-14: qty 1

## 2018-06-14 MED ORDER — ACETAMINOPHEN 160 MG/5ML PO SOLN: 650.0000 mg | Freq: Once | ORAL | Status: AC

## 2018-06-14 MED ORDER — ROCURONIUM BROMIDE 100 MG/10ML IV SOLN
INTRAVENOUS | Status: DC | PRN
  Administered 2018-06-14: 80 mg via INTRAVENOUS
  Administered 2018-06-14: 20 mg via INTRAVENOUS
  Administered 2018-06-14: 10 mg via INTRAVENOUS
  Administered 2018-06-14: 20 mg via INTRAVENOUS

## 2018-06-14 MED ORDER — MAGNESIUM SULFATE 4 GM/100ML IV SOLN
4.0000 g | Freq: Once | INTRAVENOUS | Status: AC
  Administered 2018-06-14: 4 g via INTRAVENOUS
  Filled 2018-06-14: qty 100

## 2018-06-14 MED ORDER — VANCOMYCIN HCL IN DEXTROSE 1-5 GM/200ML-% IV SOLN
1000.0000 mg | Freq: Once | INTRAVENOUS | Status: AC
  Administered 2018-06-14: 1000 mg via INTRAVENOUS
  Filled 2018-06-14: qty 200

## 2018-06-14 MED ORDER — SODIUM CHLORIDE 0.9 % IV SOLN
INTRAVENOUS | Status: DC | PRN
  Administered 2018-06-14: 10:00:00 via INTRAVENOUS

## 2018-06-14 MED ORDER — ASPIRIN 81 MG PO CHEW: 324.0000 mg | CHEWABLE_TABLET | Freq: Every day | ORAL | Status: DC

## 2018-06-14 MED ORDER — MIDAZOLAM HCL 2 MG/2ML IJ SOLN
2.0000 mg | Freq: Once | INTRAMUSCULAR | Status: AC
  Administered 2018-06-14: 2 mg via INTRAVENOUS

## 2018-06-14 MED ORDER — PHENYLEPHRINE HCL-NACL 20-0.9 MG/250ML-% IV SOLN: 0.0000 ug/min | INTRAVENOUS | Status: DC

## 2018-06-14 MED ORDER — DEXMEDETOMIDINE HCL IN NACL 200 MCG/50ML IV SOLN
0.0000 ug/kg/h | INTRAVENOUS | Status: DC
  Administered 2018-06-14: 0.3 ug/kg/h via INTRAVENOUS
  Filled 2018-06-14: qty 50

## 2018-06-14 MED ORDER — FENTANYL CITRATE (PF) 250 MCG/5ML IJ SOLN
INTRAMUSCULAR | Status: DC | PRN
  Administered 2018-06-14 (×4): 100 ug via INTRAVENOUS
  Administered 2018-06-14 (×3): 50 ug via INTRAVENOUS
  Administered 2018-06-14: 150 ug via INTRAVENOUS
  Administered 2018-06-14 (×2): 250 ug via INTRAVENOUS
  Administered 2018-06-14 (×2): 50 ug via INTRAVENOUS
  Administered 2018-06-14 (×2): 100 ug via INTRAVENOUS

## 2018-06-14 MED ORDER — FAMOTIDINE IN NACL 20-0.9 MG/50ML-% IV SOLN
20.0000 mg | Freq: Two times a day (BID) | INTRAVENOUS | Status: AC
  Administered 2018-06-14: 20 mg via INTRAVENOUS
  Filled 2018-06-14: qty 50

## 2018-06-14 MED ORDER — MIDAZOLAM HCL 2 MG/2ML IJ SOLN
INTRAMUSCULAR | Status: AC
  Administered 2018-06-14: 2 mg via INTRAVENOUS
  Filled 2018-06-14: qty 2

## 2018-06-14 MED ORDER — DOPAMINE-DEXTROSE 3.2-5 MG/ML-% IV SOLN: 3.0000 ug/kg/min | INTRAVENOUS | Status: DC

## 2018-06-14 MED ORDER — CHLORHEXIDINE GLUCONATE 0.12% ORAL RINSE (MEDLINE KIT)
15.0000 mL | Freq: Two times a day (BID) | OROMUCOSAL | Status: DC
  Administered 2018-06-14: 15 mL via OROMUCOSAL

## 2018-06-14 MED ORDER — SODIUM CHLORIDE 0.9% FLUSH: 3.0000 mL | INTRAVENOUS | Status: DC | PRN

## 2018-06-14 MED ORDER — MORPHINE SULFATE (PF) 2 MG/ML IV SOLN
1.0000 mg | INTRAVENOUS | Status: DC | PRN
  Administered 2018-06-14: 4 mg via INTRAVENOUS
  Administered 2018-06-14: 2 mg via INTRAVENOUS
  Administered 2018-06-15 (×4): 4 mg via INTRAVENOUS
  Filled 2018-06-14 (×4): qty 2
  Filled 2018-06-14: qty 1
  Filled 2018-06-14: qty 2

## 2018-06-14 MED ORDER — SODIUM BICARBONATE 8.4 % IV SOLN
100.0000 meq | Freq: Once | INTRAVENOUS | Status: AC
  Administered 2018-06-14: 100 meq via INTRAVENOUS

## 2018-06-14 MED ORDER — HEPARIN SODIUM (PORCINE) 1000 UNIT/ML IJ SOLN
INTRAMUSCULAR | Status: DC | PRN
  Administered 2018-06-14: 2000 [IU] via INTRAVENOUS
  Administered 2018-06-14: 20000 [IU] via INTRAVENOUS

## 2018-06-14 MED ORDER — INSULIN REGULAR BOLUS VIA INFUSION
0.0000 [IU] | Freq: Three times a day (TID) | INTRAVENOUS | Status: DC
  Filled 2018-06-14: qty 10

## 2018-06-14 SURGICAL SUPPLY — 102 items
ATTRACTOMAT 16X20 MAGNETIC DRP (DRAPES) ×2 IMPLANT
BAG DECANTER FOR FLEXI CONT (MISCELLANEOUS) ×4 IMPLANT
BANDAGE ACE 4X5 VEL STRL LF (GAUZE/BANDAGES/DRESSINGS) ×4 IMPLANT
BANDAGE ACE 6X5 VEL STRL LF (GAUZE/BANDAGES/DRESSINGS) ×4 IMPLANT
BANDAGE ELASTIC 4 VELCRO ST LF (GAUZE/BANDAGES/DRESSINGS) ×2 IMPLANT
BANDAGE ELASTIC 6 VELCRO ST LF (GAUZE/BANDAGES/DRESSINGS) ×2 IMPLANT
BASKET HEART  (ORDER IN 25'S) (MISCELLANEOUS) ×1
BASKET HEART (ORDER IN 25'S) (MISCELLANEOUS) ×1
BASKET HEART (ORDER IN 25S) (MISCELLANEOUS) ×2 IMPLANT
BATTERY MAXDRIVER (MISCELLANEOUS) ×2 IMPLANT
BLADE STERNUM SYSTEM 6 (BLADE) ×4 IMPLANT
BLADE SURG 11 STRL SS (BLADE) ×2 IMPLANT
BNDG GAUZE ELAST 4 BULKY (GAUZE/BANDAGES/DRESSINGS) ×4 IMPLANT
CANISTER SUCT 3000ML PPV (MISCELLANEOUS) ×4 IMPLANT
CANNULA EZ GLIDE AORTIC 21FR (CANNULA) ×4 IMPLANT
CATH CPB KIT HENDRICKSON (MISCELLANEOUS) ×4 IMPLANT
CATH ROBINSON RED A/P 18FR (CATHETERS) ×4 IMPLANT
CATH THORACIC 36FR (CATHETERS) ×4 IMPLANT
CATH THORACIC 36FR RT ANG (CATHETERS) ×4 IMPLANT
CLIP VESOCCLUDE MED 24/CT (CLIP) IMPLANT
CLIP VESOCCLUDE SM WIDE 24/CT (CLIP) ×6 IMPLANT
COVER WAND RF STERILE (DRAPES) ×2 IMPLANT
CRADLE DONUT ADULT HEAD (MISCELLANEOUS) ×4 IMPLANT
DERMABOND ADVANCED (GAUZE/BANDAGES/DRESSINGS) ×2
DERMABOND ADVANCED .7 DNX12 (GAUZE/BANDAGES/DRESSINGS) IMPLANT
DRAPE CARDIOVASCULAR INCISE (DRAPES) ×2
DRAPE SLUSH/WARMER DISC (DRAPES) ×4 IMPLANT
DRAPE SRG 135X102X78XABS (DRAPES) ×2 IMPLANT
DRSG COVADERM 4X14 (GAUZE/BANDAGES/DRESSINGS) ×4 IMPLANT
ELECT REM PT RETURN 9FT ADLT (ELECTROSURGICAL) ×8
ELECTRODE REM PT RTRN 9FT ADLT (ELECTROSURGICAL) ×4 IMPLANT
FELT TEFLON 1X6 (MISCELLANEOUS) ×6 IMPLANT
GAUZE SPONGE 4X4 12PLY STRL (GAUZE/BANDAGES/DRESSINGS) ×8 IMPLANT
GAUZE SPONGE 4X4 12PLY STRL LF (GAUZE/BANDAGES/DRESSINGS) ×4 IMPLANT
GLOVE BIO SURGEON STRL SZ 6 (GLOVE) ×4 IMPLANT
GLOVE BIO SURGEON STRL SZ 6.5 (GLOVE) ×2 IMPLANT
GLOVE BIO SURGEONS STRL SZ 6.5 (GLOVE) ×2
GLOVE BIOGEL PI IND STRL 6 (GLOVE) IMPLANT
GLOVE BIOGEL PI IND STRL 7.0 (GLOVE) IMPLANT
GLOVE BIOGEL PI IND STRL 8 (GLOVE) IMPLANT
GLOVE BIOGEL PI INDICATOR 6 (GLOVE) ×8
GLOVE BIOGEL PI INDICATOR 7.0 (GLOVE) ×2
GLOVE BIOGEL PI INDICATOR 8 (GLOVE) ×2
GLOVE SURG SIGNA 7.5 PF LTX (GLOVE) ×12 IMPLANT
GOWN STRL REUS W/ TWL LRG LVL3 (GOWN DISPOSABLE) ×8 IMPLANT
GOWN STRL REUS W/ TWL XL LVL3 (GOWN DISPOSABLE) ×4 IMPLANT
GOWN STRL REUS W/TWL LRG LVL3 (GOWN DISPOSABLE) ×16
GOWN STRL REUS W/TWL XL LVL3 (GOWN DISPOSABLE) ×4
HEMOSTAT POWDER SURGIFOAM 1G (HEMOSTASIS) ×12 IMPLANT
HEMOSTAT SURGICEL 2X14 (HEMOSTASIS) ×4 IMPLANT
INSERT FOGARTY XLG (MISCELLANEOUS) IMPLANT
KIT BASIN OR (CUSTOM PROCEDURE TRAY) ×4 IMPLANT
KIT SUCTION CATH 14FR (SUCTIONS) ×8 IMPLANT
KIT TURNOVER KIT B (KITS) ×4 IMPLANT
KIT VASOVIEW HEMOPRO 2 VH 4000 (KITS) ×4 IMPLANT
MARKER GRAFT CORONARY BYPASS (MISCELLANEOUS) ×12 IMPLANT
NS IRRIG 1000ML POUR BTL (IV SOLUTION) ×22 IMPLANT
PACK E OPEN HEART (SUTURE) ×4 IMPLANT
PACK OPEN HEART (CUSTOM PROCEDURE TRAY) ×4 IMPLANT
PAD ARMBOARD 7.5X6 YLW CONV (MISCELLANEOUS) ×8 IMPLANT
PAD ELECT DEFIB RADIOL ZOLL (MISCELLANEOUS) ×4 IMPLANT
PENCIL BUTTON HOLSTER BLD 10FT (ELECTRODE) ×4 IMPLANT
PLATE STERNAL 2.3X208 14H 2-PK (Plate) ×2 IMPLANT
PUNCH AORTIC ROTATE  4.5MM 8IN (MISCELLANEOUS) ×2 IMPLANT
PUNCH AORTIC ROTATE 4.0MM (MISCELLANEOUS) IMPLANT
PUNCH AORTIC ROTATE 4.5MM 8IN (MISCELLANEOUS) IMPLANT
PUNCH AORTIC ROTATE 5MM 8IN (MISCELLANEOUS) IMPLANT
SCREW BONE LOCKING 2.3X9 (Screw) ×10 IMPLANT
SCREW LOCKING TI 2.3X11MM (Screw) ×24 IMPLANT
SET CARDIOPLEGIA MPS 5001102 (MISCELLANEOUS) ×2 IMPLANT
SOLUTION ANTI FOG 6CC (MISCELLANEOUS) ×2 IMPLANT
SUT BONE WAX W31G (SUTURE) ×4 IMPLANT
SUT MNCRL AB 4-0 PS2 18 (SUTURE) ×2 IMPLANT
SUT PROLENE 3 0 SH DA (SUTURE) ×4 IMPLANT
SUT PROLENE 4 0 RB 1 (SUTURE) ×2
SUT PROLENE 4 0 SH DA (SUTURE) IMPLANT
SUT PROLENE 4-0 RB1 .5 CRCL 36 (SUTURE) IMPLANT
SUT PROLENE 6 0 C 1 30 (SUTURE) ×8 IMPLANT
SUT PROLENE 7 0 BV 1 (SUTURE) ×6 IMPLANT
SUT PROLENE 7 0 BV1 MDA (SUTURE) ×6 IMPLANT
SUT PROLENE 8 0 BV175 6 (SUTURE) IMPLANT
SUT STEEL 6MS V (SUTURE) ×4 IMPLANT
SUT STEEL STERNAL CCS#1 18IN (SUTURE) IMPLANT
SUT STEEL SZ 6 DBL 3X14 BALL (SUTURE) ×4 IMPLANT
SUT VIC AB 1 CTX 36 (SUTURE) ×4
SUT VIC AB 1 CTX36XBRD ANBCTR (SUTURE) ×4 IMPLANT
SUT VIC AB 2-0 CT1 27 (SUTURE) ×2
SUT VIC AB 2-0 CT1 TAPERPNT 27 (SUTURE) IMPLANT
SUT VIC AB 2-0 CTX 27 (SUTURE) IMPLANT
SUT VIC AB 3-0 SH 27 (SUTURE)
SUT VIC AB 3-0 SH 27X BRD (SUTURE) IMPLANT
SUT VIC AB 3-0 X1 27 (SUTURE) IMPLANT
SUT VICRYL 4-0 PS2 18IN ABS (SUTURE) IMPLANT
SYSTEM SAHARA CHEST DRAIN ATS (WOUND CARE) ×4 IMPLANT
TAPE CLOTH SURG 4X10 WHT LF (GAUZE/BANDAGES/DRESSINGS) ×2 IMPLANT
TAPE PAPER 2X10 WHT MICROPORE (GAUZE/BANDAGES/DRESSINGS) ×2 IMPLANT
TOWEL GREEN STERILE (TOWEL DISPOSABLE) ×4 IMPLANT
TOWEL GREEN STERILE FF (TOWEL DISPOSABLE) ×4 IMPLANT
TRAY FOLEY SLVR 16FR TEMP STAT (SET/KITS/TRAYS/PACK) ×2 IMPLANT
TUBING INSUFFLATION (TUBING) ×4 IMPLANT
UNDERPAD 30X30 (UNDERPADS AND DIAPERS) ×4 IMPLANT
WATER STERILE IRR 1000ML POUR (IV SOLUTION) ×8 IMPLANT

## 2018-06-14 NOTE — Procedures (Signed)
Extubation Procedure Note  Patient Details:   Name: Cassidy Green DOB: June 17, 1955 MRN: [id number]   Airway Documentation:    Vent end date: 06/14/18 Vent end time: 2135   Evaluation  O2 sats: stable throughout Complications: No apparent complications Patient did tolerate procedure well. Bilateral Breath Sounds: Rhonchi, Diminished   Yes   VC 800. NIF -20. Pt extubated to 4l [name] Pt tolerating well at this time. SPO2 94%  [name] 06/14/2018, 9:43 PM

## 2018-06-14 NOTE — Interval H&P Note (Signed)
History and Physical Interval Note:  06/14/2018 9:41 AM  Cassidy Green  has presented today for surgery, with the diagnosis of CAD  The various methods of treatment have been discussed with the patient and family. After consideration of risks, benefits and other options for treatment, the patient has consented to  Procedure(s): CORONARY ARTERY BYPASS GRAFTING (CABG) (N/A) TRANSESOPHAGEAL ECHOCARDIOGRAM (TEE) (N/A) as a surgical intervention .  The patient's history has been reviewed, patient examined, no change in status, stable for surgery.  I have reviewed the patient's chart and labs.  Questions were answered to the patient's satisfaction.     [name]

## 2018-06-14 NOTE — Anesthesia Preprocedure Evaluation (Addendum)
Anesthesia Evaluation  Patient identified by MRN, date of birth, ID band Patient awake    Reviewed: Allergy & Precautions, H&P , NPO status , Patient's Chart, lab work & pertinent test results  Airway Mallampati: II  TM Distance: >3 FB Neck ROM: Full    Dental no notable dental hx. (+) Teeth Intact, Dental Advisory Given   Pulmonary neg pulmonary ROS, former smoker,    Pulmonary exam normal breath sounds clear to auscultation       Cardiovascular hypertension, Pt. on medications + CAD   Rhythm:Regular Rate:Normal     Neuro/Psych  Headaches, Anxiety    GI/Hepatic Neg liver ROS, GERD  Medicated and Controlled,  Endo/Other  diabetes, Type 2, Oral Hypoglycemic Agents  Renal/GU negative Renal ROS  negative genitourinary   Musculoskeletal  (+) Arthritis , Osteoarthritis,    Abdominal   Peds  Hematology  (+) Blood dyscrasia, anemia ,   Anesthesia Other Findings   Reproductive/Obstetrics negative OB ROS                            Anesthesia Physical Anesthesia Plan  ASA: IV  Anesthesia Plan: General   Post-op Pain Management:    Induction: Intravenous  PONV Risk Score and Plan: 3 and Midazolam and Treatment may vary due to age or medical condition  Airway Management Planned: Oral ETT  Additional Equipment: Arterial line, CVP, PA Cath, TEE and Ultrasound Guidance Line Placement  Intra-op Plan:   Post-operative Plan: Post-operative intubation/ventilation  Informed Consent: I have reviewed the patients History and Physical, chart, labs and discussed the procedure including the risks, benefits and alternatives for the proposed anesthesia with the patient or authorized representative who has indicated his/her understanding and acceptance.     Dental advisory given  Plan Discussed with: CRNA  Anesthesia Plan Comments:         Anesthesia Quick Evaluation

## 2018-06-14 NOTE — Progress Notes (Signed)
  Echocardiogram Echocardiogram Transesophageal has been performed.  Cassidy Green, Cassidy Green 06/14/2018, 11:38 AM

## 2018-06-14 NOTE — Anesthesia Procedure Notes (Signed)
Central Venous Catheter Insertion Performed by: Gaynelle Adu, MD, anesthesiologist Start/End1/17/2020 9:45 AM, 06/14/2018 10:00 AM Patient location: Pre-op. Preanesthetic checklist: patient identified, IV checked, site marked, risks and benefits discussed, surgical consent, monitors and equipment checked, pre-op evaluation, timeout performed and anesthesia consent Hand hygiene performed  and maximum sterile barriers used  PA cath was placed.Swan type:thermodilution PA Cath depth:45 Procedure performed without using ultrasound guided technique. Attempts: 1 Patient tolerated the procedure well with no immediate complications.

## 2018-06-14 NOTE — Op Note (Signed)
NAME: Cassidy Green, Cassidy S. MEDICAL RECORD [id number] ACCOUNT [id number] DATE OF BIRTH:May 02, 1956 FACILITY: [facility] LOCATION: [facility] PHYSICIAN:Ryenne Lynam C. Dax Murguia, MD  OPERATIVE REPORT  DATE OF PROCEDURE:  06/14/2018  PREOPERATIVE DIAGNOSIS:  Severe 3-vessel coronary disease with unstable angina.  POSTOPERATIVE DIAGNOSIS:  Severe 3-vessel coronary disease with unstable angina.  PROCEDURE:  Median sternotomy, extracorporeal circulation Coronary artery bypass grafting x 4   Left internal mammary artery to mid left anterior descending,  Saphenous vein graft to distal descending,  Sequential saphenous vein graft to first diagonal and  obtuse marginal Endoscopic vein harvest right leg, Sternal plating with KLS Martin plates.  SURGEON:  Charlett LangoSteven Yurika Pereda, MD  ASSISTANT:  Lowella DandyErin Barrett, PA-C.  ANESTHESIA:  General.  FINDINGS:   1.Transesophageal echocardiography revealed preserved systolic function, left ventricular hypertrophy, mild mitral regurgitation. 2.Distal LAD- fair quality target.  Remaining targets good quality. 3.Posterior descending ungraftable.   4.Vein and mammary artery both good quality.  CLINICAL NOTE:  Cassidy Green is a 63 year old woman with multiple cardiac risk factors, but no previous history of coronary artery disease. She presented with accelerating anginal symptoms.  She had a positive nuclear stress test.  Cardiac catheterization  revealed severe 3-vessel coronary disease and she was referred for coronary artery bypass grafting.  The indications, risks, benefits, and alternatives were discussed in detail with the patient.  She understood and accepted the risks and agreed to  proceed.  DESCRIPTION OF PROCEDURE:  The patient was brought to the preoperative holding area on 06/14/2018.  Dr. Autumn PattyEdmond Fitzgerald of anesthesia placed an arterial blood pressure monitoring line and a Swan-Ganz catheter.  She was taken to the Operating Room,  anesthetized and intubated.  A  Foley catheter was placed.  Transesophageal echocardiography was performed.  Intravenous antibiotics were administered.  The chest, abdomen and legs were prepped and draped in the usual sterile fashion.  Transesophageal  echocardiography showed mild mitral regurgitation.  There was preserved left ventricular function.  A timeout was performed.  An incision was made in the medial aspect of the right leg at the level of the knee.  The greater saphenous vein was harvested from the upper calf to the groin endoscopically.  The vein was of good quality.  Simultaneously, a  median sternotomy was performed.  The sternum was noted to be very thin.  The left internal mammary artery was harvested using standard technique.  It was a relatively small vessel, but had excellent flow when divided distally.  Two thousand units of  heparin was administered during the vessel harvest.  The remainder of a full heparin dose was given prior to opening the pericardium.  The pericardium was opened.  The ascending aorta was inspected.  It was small but free of any evidence of atherosclerotic disease.  After confirming adequate anticoagulation with ACT measurement, the aorta was cannulated via concentric 2-0 Ethibond  pledgeted pursestring sutures.  A dual stage venous cannula was placed via pursestring suture in the right atrial appendage.  Cardiopulmonary bypass was initiated.  Flows were maintained per protocol.  The patient was cooled to 32 degrees Celsius.  The  coronary arteries were inspected and anastomotic sites were chosen.  The posterior descending was a small, diffusely diseased vessel.  It was not suitable for grafting.  It did appear that the distal LAD would accept a graft.  The conduits were inspected  and cut to length.  A foam pad was placed in the pericardium to insulate the heart.  A temperature probe was placed in the myocardial  septum and a cardioplegia cannula was placed in the ascending aorta.  The aorta  was cross clamped.  The left ventricle was emptied via the aortic root vent.  Cardiac arrest was achieved with a combination of cold antegrade blood cardioplegia and topical iced saline.  After achieving a complete diastolic arrest and septal  cooling to 10 degrees Celsius with 1 liter of cardioplegia, the following distal anastomoses were performed.  A reversed saphenous vein graft was placed sequentially to the first diagonal and first obtuse marginal.  The first diagonal was a large anterolateral branch the equivalent of a ramus intermedius.  It was a 2 mm good quality vessel at the site of  anastomosis.  A side-to-side anastomosis was performed off of a side branch of the vein graft to the first diagonal.  The distal end of the vein then was bevelled.  An end-to-side anastomosis was performed to OM1.  This was a 1.5 mm good quality target  vessel.  Both anastomoses were done with running 7-0 Prolene sutures.  A probe passed easily proximally and distally in both anastomoses at their completion.  Cardioplegia was administered down the graft and there was good flow and good hemostasis.  Additional cardioplegia was administered down the aortic root.  The heart was elevated, exposing the anterior wall.  The site selected for anastomosis in the mid LAD was identified and an arteriotomy was made.  A 1.5 mm probe passed proximally, but only  for a very short distance distally and there was a severe stenosis consistent with the findings on catheterization.  The distal LAD then was opened.  An arteriotomy was made and a 1.5 mm probe did pass to the apex.  It only passed for a short distance  proximally.  A reversed saphenous vein graft was placed end-to-side to the distal LAD with a running 7-0 Prolene suture.  The probe did pass distally at the completion of the anastomosis, cardioplegia was administered down the graft and there was good  flow and good hemostasis.  The left internal mammary artery was  brought through a window in the pericardium.  It was then anastomosed end-to-side to the arteriotomy in the mid LAD.  The mammary was relatively small caliber, but did accept a 1.5 mm probe and had excellent flow.   The end-to-side anastomosis was performed with a running 8-0 Prolene suture.  At the completion of the anastomosis, the bulldog clamp was briefly removed.  Septal rewarming was noted.  The bulldog clamp was replaced.  The mammary pedicle was sutured to  the epicardial surface of the heart with 6-0 Prolene sutures.  Additional cardioplegia was administered.  The vein grafts were cut to length.  Cardioplegia cannula was removed from the ascending aorta.  The proximal vein graft anastomoses were performed to 4.5 mm punch aortotomies with running 6-0 Prolene sutures.   After completion of the final proximal anastomosis, the patient was placed in Trendelenburg position.  Lidocaine was administered.  The aortic root was de-aired and the aortic crossclamp was removed.  The total crossclamp time was 58 minutes.  The  patient spontaneously resumed sinus rhythm and did not require defibrillation.  While rewarming was completed, all proximal and distal anastomoses were inspected for hemostasis.  Epicardial pacing wires were placed on the right ventricle and right  atrium.  When the patient had rewarmed to a core temperature of 37 degrees Celsius, she was weaned from cardiopulmonary bypass on the first attempt.  Total bypass time was 90 minutes.  Blood pressure and PA pressures were good.  Transesophageal  echocardiography showed good left ventricular systolic function.  The left ventricle was relatively underfilled.  Volume administration was begun.  The initial cardiac index was less than 2 liters per minute per meter sq.  The patient was started on a  dopamine infusion at 3 mcg per kilogram per minute while the resuscitation was undertaken.  The chest was copiously irrigated with warm saline.  Hemostasis  was achieved.  The pericardium was reapproximated over the ascending aorta with interrupted 3-0  silk sutures. It came together easily without tension or kinking the underlying graft.  Left pleural and mediastinal chest tubes were placed through separate subcostal incisions.  The sternum was relatively narrow and relatively thin.  I was concerned that  she would be at risk for wire pull-through, so reinforcing KLS martin sternal plates were placed on either side using 11 mm and 9 mm screws.  Eight screws were used on the left side and 9 on the right side.  The single and double heavy gauge stainless steel wires  then were placed around the sternum and plates.  The sternum came together and was closed without difficulty.  The pectoralis fascia, subcutaneous tissue and skin were closed in standard fashion.  All sponge, needle and instrument counts were correct at  the end of the procedure.  The patient was taken from the operating room to the surgical Intensive Care Unit, intubated and in good condition.  TN/NUANCE  D:06/14/2018 T:06/14/2018 JOB:[id number]

## 2018-06-14 NOTE — Anesthesia Procedure Notes (Signed)
Central Venous Catheter Insertion Performed by: Roderic Palau, MD, anesthesiologist Start/End1/17/2020 9:45 AM, 06/14/2018 10:00 AM Patient location: Pre-op. Preanesthetic checklist: patient identified, IV checked, site marked, risks and benefits discussed, surgical consent, monitors and equipment checked, pre-op evaluation, timeout performed and anesthesia consent Position: Trendelenburg Lidocaine 1% used for infiltration and patient sedated Hand hygiene performed , maximum sterile barriers used  and Seldinger technique used Catheter size: 9 Fr Total catheter length 10. Central line was placed.MAC introducer Procedure performed using ultrasound guided technique. Ultrasound Notes:anatomy identified, needle tip was noted to be adjacent to the nerve/plexus identified, no ultrasound evidence of intravascular and/or intraneural injection and image(s) printed for medical record Attempts: 1 Following insertion, line sutured, dressing applied and Biopatch. Post procedure assessment: blood return through all ports, free fluid flow and no air  Patient tolerated the procedure well with no immediate complications.

## 2018-06-14 NOTE — Progress Notes (Signed)
   CABG today Will follow  Donato Schultz, MD

## 2018-06-14 NOTE — Progress Notes (Signed)
Patient ID: Cassidy Green, female   DOB: [birth year], 63 y.o.   MRN: [id number]  TCTS Evening Rounds:   Hemodynamically stable  CI = 1.7 on dop 3  Weaned on vent and ready for extubation  Urine output good  CT output low  CBC    Component Value Date/Time   WBC 13.6 (H) 06/14/2018 1615   RBC 4.45 06/14/2018 1615   HGB 11.9 (L) 06/14/2018 1622   HCT 35.0 (L) 06/14/2018 1622   PLT 150 06/14/2018 1615   MCV 86.1 06/14/2018 1615   MCH 27.9 06/14/2018 1615   MCHC 32.4 06/14/2018 1615   RDW 14.9 06/14/2018 1615     BMET    Component Value Date/Time   NA 144 06/14/2018 1622   K 4.3 06/14/2018 1622   CL 110 06/14/2018 1525   CO2 20 (L) 06/14/2018 0659   GLUCOSE 144 (H) 06/14/2018 1622   BUN 11 06/14/2018 1525   CREATININE 0.80 06/14/2018 1525   CALCIUM 9.2 06/14/2018 0659   GFRNONAA 39 (L) 06/14/2018 0659   GFRAA 45 (L) 06/14/2018 0659     A/P:  Stable postop course. Continue current plans

## 2018-06-14 NOTE — Brief Op Note (Signed)
06/12/2018 - 06/14/2018  4:49 PM  PATIENT:  Cassidy Green  63 y.o. female  PRE-OPERATIVE DIAGNOSIS:  CAD  POST-OPERATIVE DIAGNOSIS:  CAD  PROCEDURE:  Procedure(s):  CORONARY ARTERY BYPASS GRAFTING x 4 -[name] to MID LAD -SVG to DISTAL LAD -SEQ SVG to DIAGONAL 1 and OM 1  ENDOSCOPIC HARVEST GREATER SAPHENOUS VEIN -Right Leg  TRANSESOPHAGEAL ECHOCARDIOGRAM (TEE) (N/A)  SURGEON:  Surgeon(s) and Role:    * [name], MD - Primary  PHYSICIAN ASSISTANT: Lowella Dandy PA-C  ANESTHESIA:   general  EBL:  -  BLOOD ADMINISTERED: 2  PRBC  DRAINS: Left Pleural Chest Tubes, Mediastinal Chest Drains   LOCAL MEDICATIONS USED:  NONE  SPECIMEN:  No Specimen  DISPOSITION OF SPECIMEN:  N/A  COUNTS:  YES  TOURNIQUET:  * No tourniquets in log *  DICTATION: .Dragon Dictation  PLAN OF CARE: Admit to inpatient   PATIENT DISPOSITION:  ICU - intubated and hemodynamically stable.   Delay start of Pharmacological VTE agent (>24hrs) due to surgical blood loss or risk of bleeding: yes

## 2018-06-14 NOTE — Anesthesia Procedure Notes (Signed)
Procedure Name: Intubation Date/Time: 06/14/2018 10:46 AM Performed by: Eligha Bridegroom, CRNA Pre-anesthesia Checklist: Patient identified, Emergency Drugs available, Suction available and Patient being monitored Patient Re-evaluated:Patient Re-evaluated prior to induction Oxygen Delivery Method: Circle system utilized Preoxygenation: Pre-oxygenation with 100% oxygen Induction Type: IV induction Ventilation: Mask ventilation without difficulty Laryngoscope Size: Mac and 3 Tube type: Oral Tube size: 7.0 mm Number of attempts: 1 Airway Equipment and Method: Stylet and Oral airway Placement Confirmation: ETT inserted through vocal cords under direct vision,  positive ETCO2 and breath sounds checked- equal and bilateral Secured at: 22 cm Tube secured with: Tape Dental Injury: Teeth and Oropharynx as per pre-operative assessment

## 2018-06-14 NOTE — Transfer of Care (Signed)
Immediate Anesthesia Transfer of Care Note  Patient: Cassidy Green  Procedure(s) Performed: CORONARY ARTERY BYPASS GRAFTING (CABG) x4  using left internal mammary artery and right greater saphenous vein harvested endoscopically. (N/A Chest) TRANSESOPHAGEAL ECHOCARDIOGRAM (TEE) (N/A )  Patient Location: ICU  Anesthesia Type:General  Level of Consciousness: Patient remains intubated per anesthesia plan  Airway & Oxygen Therapy: Patient remains intubated per anesthesia plan  Post-op Assessment: Report given to RN and Post -op Vital signs reviewed and stable  Post vital signs: Reviewed and stable  Last Vitals:  Vitals Value Taken Time  BP 109/84 06/14/2018  4:15 PM  Temp    Pulse 89 06/14/2018  4:16 PM  Resp 12 06/14/2018  4:16 PM  SpO2 98 % 06/14/2018  4:16 PM  Vitals shown include unvalidated device data.  Last Pain:  Vitals:   06/14/18 0821  TempSrc: Oral  PainSc: 0-No pain      Patients Stated Pain Goal: 0 (06/13/18 2000)  Complications: No apparent anesthesia complications

## 2018-06-14 NOTE — Anesthesia Procedure Notes (Signed)
Arterial Line Insertion Start/End1/17/2020 10:26 AM, 06/14/2018 10:30 AM Performed by: Marcene Duos, MD, Gwenyth Allegra, CRNA, CRNA  Patient location: Pre-op. Preanesthetic checklist: patient identified, IV checked, site marked, risks and benefits discussed, surgical consent, monitors and equipment checked, pre-op evaluation and timeout performed Lidocaine 1% used for infiltration and patient sedated Left, radial was placed Catheter size: 20 G Hand hygiene performed , maximum sterile barriers used  and Seldinger technique used Allen's test indicative of satisfactory collateral circulation Attempts: 1 Procedure performed without using ultrasound guided technique.

## 2018-06-14 NOTE — Research (Signed)
[facility] Research study: Followed up about patient participating in study, and patient declines to participate.

## 2018-06-15 ENCOUNTER — Inpatient Hospital Stay (HOSPITAL_COMMUNITY): Payer: Managed Care, Other (non HMO)

## 2018-06-15 LAB — GLUCOSE, CAPILLARY
GLUCOSE-CAPILLARY: 157 mg/dL — AB (ref 70–99)
GLUCOSE-CAPILLARY: 96 mg/dL (ref 70–99)
Glucose-Capillary: 104 mg/dL — ABNORMAL HIGH (ref 70–99)
Glucose-Capillary: 110 mg/dL — ABNORMAL HIGH (ref 70–99)
Glucose-Capillary: 111 mg/dL — ABNORMAL HIGH (ref 70–99)
Glucose-Capillary: 111 mg/dL — ABNORMAL HIGH (ref 70–99)
Glucose-Capillary: 112 mg/dL — ABNORMAL HIGH (ref 70–99)
Glucose-Capillary: 120 mg/dL — ABNORMAL HIGH (ref 70–99)
Glucose-Capillary: 122 mg/dL — ABNORMAL HIGH (ref 70–99)
Glucose-Capillary: 129 mg/dL — ABNORMAL HIGH (ref 70–99)
Glucose-Capillary: 143 mg/dL — ABNORMAL HIGH (ref 70–99)
Glucose-Capillary: 146 mg/dL — ABNORMAL HIGH (ref 70–99)
Glucose-Capillary: 153 mg/dL — ABNORMAL HIGH (ref 70–99)
Glucose-Capillary: 196 mg/dL — ABNORMAL HIGH (ref 70–99)
Glucose-Capillary: 79 mg/dL (ref 70–99)
Glucose-Capillary: 91 mg/dL (ref 70–99)
Glucose-Capillary: 93 mg/dL (ref 70–99)

## 2018-06-15 LAB — CBC
HCT: 34.8 % — ABNORMAL LOW (ref 36.0–46.0)
HCT: 36.7 % (ref 36.0–46.0)
Hemoglobin: 11.5 g/dL — ABNORMAL LOW (ref 12.0–15.0)
Hemoglobin: 11.3 g/dL — ABNORMAL LOW (ref 12.0–15.0)
MCH: 28 pg (ref 26.0–34.0)
MCH: 26.8 pg (ref 26.0–34.0)
MCHC: 33 g/dL (ref 30.0–36.0)
MCHC: 30.8 g/dL (ref 30.0–36.0)
MCV: 84.9 fL (ref 80.0–100.0)
MCV: 87 fL (ref 80.0–100.0)
Platelets: 165 10*3/uL (ref 150–400)
Platelets: 127 10*3/uL — ABNORMAL LOW (ref 150–400)
RBC: 4.1 MIL/uL (ref 3.87–5.11)
RBC: 4.22 MIL/uL (ref 3.87–5.11)
RDW: 14.8 % (ref 11.5–15.5)
RDW: 15.5 % (ref 11.5–15.5)
WBC: 12.7 10*3/uL — ABNORMAL HIGH (ref 4.0–10.5)
WBC: 10 10*3/uL (ref 4.0–10.5)
nRBC: 0 % (ref 0.0–0.2)
nRBC: 0 % (ref 0.0–0.2)

## 2018-06-15 LAB — CREATININE, SERUM
Creatinine, Ser: 1.33 mg/dL — ABNORMAL HIGH (ref 0.44–1.00)
GFR calc Af Amer: 49 mL/min — ABNORMAL LOW (ref >60)
GFR calc non Af Amer: 42 mL/min — ABNORMAL LOW (ref >60)

## 2018-06-15 LAB — MAGNESIUM
MAGNESIUM: 2.1 mg/dL (ref 1.7–2.4)
Magnesium: 2.3 mg/dL (ref 1.7–2.4)

## 2018-06-15 LAB — POCT I-STAT 3, ART BLOOD GAS (G3+)
Acid-base deficit: 2 mmol/L (ref 0.0–2.0)
Bicarbonate: 22.9 mmol/L (ref 20.0–28.0)
O2 Saturation: 92 %
TCO2: 24 mmol/L (ref 22–32)
pCO2 arterial: 40.4 mmHg (ref 32.0–48.0)
pH, Arterial: 7.362 (ref 7.350–7.450)
pO2, Arterial: 67 mmHg — ABNORMAL LOW (ref 83.0–108.0)

## 2018-06-15 LAB — POCT I-STAT, CHEM 8
BUN: 11 mg/dL (ref 8–23)
Calcium, Ion: 1.1 mmol/L — ABNORMAL LOW (ref 1.15–1.40)
Chloride: 106 mmol/L (ref 98–111)
Creatinine, Ser: 1.3 mg/dL — ABNORMAL HIGH (ref 0.44–1.00)
Glucose, Bld: 154 mg/dL — ABNORMAL HIGH (ref 70–99)
HCT: 35 % — ABNORMAL LOW (ref 36.0–46.0)
Hemoglobin: 11.9 g/dL — ABNORMAL LOW (ref 12.0–15.0)
Potassium: 4.7 mmol/L (ref 3.5–5.1)
Sodium: 138 mmol/L (ref 135–145)
TCO2: 23 mmol/L (ref 22–32)

## 2018-06-15 LAB — BASIC METABOLIC PANEL
Anion gap: 10 (ref 5–15)
BUN: 9 mg/dL (ref 8–23)
CALCIUM: 7.7 mg/dL — AB (ref 8.9–10.3)
CO2: 22 mmol/L (ref 22–32)
Chloride: 106 mmol/L (ref 98–111)
Creatinine, Ser: 1.04 mg/dL — ABNORMAL HIGH (ref 0.44–1.00)
GFR calc Af Amer: >60 mL/min (ref >60)
GFR calc non Af Amer: 57 mL/min — ABNORMAL LOW (ref >60)
GLUCOSE: 99 mg/dL (ref 70–99)
Potassium: 3.7 mmol/L (ref 3.5–5.1)
Sodium: 138 mmol/L (ref 135–145)

## 2018-06-15 MED ORDER — INSULIN DETEMIR 100 UNIT/ML ~~LOC~~ SOLN
10.0000 [IU] | Freq: Every day | SUBCUTANEOUS | Status: DC
  Filled 2018-06-15 (×2): qty 0.1

## 2018-06-15 MED ORDER — POTASSIUM CHLORIDE CRYS ER 20 MEQ PO TBCR
20.0000 meq | EXTENDED_RELEASE_TABLET | Freq: Two times a day (BID) | ORAL | Status: AC
  Administered 2018-06-15 (×2): 20 meq via ORAL
  Filled 2018-06-15 (×2): qty 1

## 2018-06-15 MED ORDER — POTASSIUM CHLORIDE 10 MEQ/50ML IV SOLN
10.0000 meq | INTRAVENOUS | Status: AC | PRN
  Administered 2018-06-15 (×3): 10 meq via INTRAVENOUS

## 2018-06-15 MED ORDER — INSULIN DETEMIR 100 UNIT/ML ~~LOC~~ SOLN
10.0000 [IU] | Freq: Every day | SUBCUTANEOUS | Status: DC
  Administered 2018-06-15 – 2018-06-16 (×2): 10 [IU] via SUBCUTANEOUS
  Filled 2018-06-15 (×3): qty 0.1

## 2018-06-15 MED ORDER — METOPROLOL TARTRATE 25 MG PO TABS
25.0000 mg | ORAL_TABLET | Freq: Two times a day (BID) | ORAL | Status: DC
  Administered 2018-06-15: 25 mg via ORAL
  Filled 2018-06-15: qty 1

## 2018-06-15 MED ORDER — FUROSEMIDE 10 MG/ML IJ SOLN
40.0000 mg | Freq: Two times a day (BID) | INTRAMUSCULAR | Status: AC
  Administered 2018-06-15 (×2): 40 mg via INTRAVENOUS
  Filled 2018-06-15 (×2): qty 4

## 2018-06-15 MED ORDER — ENOXAPARIN SODIUM 40 MG/0.4ML ~~LOC~~ SOLN
40.0000 mg | Freq: Every day | SUBCUTANEOUS | Status: DC
  Administered 2018-06-15 – 2018-06-20 (×6): 40 mg via SUBCUTANEOUS
  Filled 2018-06-15 (×6): qty 0.4

## 2018-06-15 MED ORDER — METOPROLOL TARTRATE 25 MG/10 ML ORAL SUSPENSION: 25.0000 mg | Freq: Two times a day (BID) | ORAL | Status: DC

## 2018-06-15 MED ORDER — INSULIN ASPART 100 UNIT/ML ~~LOC~~ SOLN
0.0000 [IU] | SUBCUTANEOUS | Status: DC
  Administered 2018-06-15: 2 [IU] via SUBCUTANEOUS
  Administered 2018-06-15: 4 [IU] via SUBCUTANEOUS
  Administered 2018-06-15 – 2018-06-16 (×3): 2 [IU] via SUBCUTANEOUS

## 2018-06-15 MED ORDER — TRAMADOL HCL 50 MG PO TABS
50.0000 mg | ORAL_TABLET | ORAL | Status: DC | PRN
  Administered 2018-06-15 – 2018-06-16 (×2): 50 mg via ORAL
  Filled 2018-06-15 (×2): qty 1

## 2018-06-15 NOTE — Progress Notes (Signed)
1 Day Post-Op Procedure(s) (LRB): CORONARY ARTERY BYPASS GRAFTING (CABG) x4  using left internal mammary artery and right greater saphenous vein harvested endoscopically. (N/A) TRANSESOPHAGEAL ECHOCARDIOGRAM (TEE) (N/A) Subjective: No complaints  Objective: Vital signs in last 24 hours: Temp:  [94.1 F (34.5 C)-99 F (37.2 C)] 98.8 F (37.1 C) (01/18 0500) Pulse Rate:  [76-105] 102 (01/18 0500) Cardiac Rhythm: Normal sinus rhythm (01/18 0400) Resp:  [12-33] 15 (01/18 0500) BP: (95-144)/(70-84) 117/82 (01/18 0500) SpO2:  [93 %-100 %] 97 % (01/18 0500) Arterial Line BP: (109-155)/(61-88) 149/75 (01/18 0500) FiO2 (%):  [40 %-60 %] 40 % (01/17 2107) Weight:  [71.3 kg] 71.3 kg (01/18 0500)  Hemodynamic parameters for last 24 hours: PAP: (22-46)/(11-35) 38/24 CO:  [2.2 L/min-3.5 L/min] 3.5 L/min CI:  [1.3 L/min/m2-2.1 L/min/m2] 2.1 L/min/m2  Intake/Output from previous day: 01/17 0701 - 01/18 0700 In: 5970.4 [P.O.:240; I.V.:4226.5; Blood:315; IV Piggyback:1188.8] Out: 7017 [BLTJQ:3009; Chest Tube:360] Intake/Output this shift: No intake/output data recorded.  General appearance: alert and cooperative Neurologic: intact Heart: regular rate and rhythm, S1, S2 normal, no murmur, click, rub or gallop Lungs: clear to auscultation bilaterally Extremities: edema moderate Wound: dressings dry  Lab Results: Recent Labs    06/14/18 2200 06/14/18 2242 06/15/18 0402  WBC 14.4*  --  12.7*  HGB 11.4* 11.6* 11.5*  HCT 36.1 34.0* 34.8*  PLT 157  --  165   BMET:  Recent Labs    06/14/18 0659  06/14/18 2242 06/15/18 0402  NA 142   < > 141 138  K 4.6   < > 3.8 3.7  CL 113*   < > 107 106  CO2 20*  --   --  22  GLUCOSE 136*   < > 147* 99  BUN 15   < > 9 9  CREATININE 1.43*   < > 0.80 1.04*  CALCIUM 9.2  --   --  7.7*   < > = values in this interval not displayed.    PT/INR:  Recent Labs    06/14/18 1615  LABPROT 15.9*  INR 1.29   ABG    Component Value Date/Time   PHART 7.362 06/15/2018 0006   HCO3 22.9 06/15/2018 0006   TCO2 24 06/15/2018 0006   ACIDBASEDEF 2.0 06/15/2018 0006   O2SAT 92.0 06/15/2018 0006   CBG (last 3)  Recent Labs    06/15/18 0517 06/15/18 0624 06/15/18 0659  GLUCAP 104* 111* 111*   CLINICAL DATA:  Status postextubation  EXAM: PORTABLE CHEST 1 VIEW  COMPARISON:  June 14, 2018  FINDINGS: Endotracheal tube and nasogastric tube have been removed. There remains a chest tube on the left as well as a mediastinal drain. Swan-Ganz catheter tip is in the main right pulmonary artery. Temporary pacemaker wires are attached to the right heart. There is no appreciable pneumothorax.  There is a right pleural effusion, new. There is bibasilar atelectasis. Heart is mildly enlarged with pulmonary vascularity normal, stable. Status post coronary artery bypass grafting.  IMPRESSION: Tube and catheter positions as described without pneumothorax. New right pleural effusion. Bibasilar atelectasis. No frank consolidation. Stable cardiac silhouette.   Electronically Signed   By: Bretta Bang III M.D.   On: 06/15/2018 07:24   ECG: sinus 93, no acute changes  Assessment/Plan: S/P Procedure(s) (LRB): CORONARY ARTERY BYPASS GRAFTING (CABG) x4  using left internal mammary artery and right greater saphenous vein harvested endoscopically. (N/A) TRANSESOPHAGEAL ECHOCARDIOGRAM (TEE) (N/A)  POD 1 Hemodynamically stable. Wean off dopamine.  Volume excess: start  diuresis.  DM: preop Hgb A1c was 7.6. glucose is under good control on low dose insulin drip. Will start Levemir and SSI.  DC chest tubes, swan, arterial line.  OOB, IS.   LOS: 3 days    Alleen Borne 06/15/2018

## 2018-06-15 NOTE — Progress Notes (Signed)
Patient ID: Cassidy Green, female   DOB: 1955-06-13, 63 y.o.   MRN: [id number] [facility] Evening Rounds:  Hemodynamically stable in sinus rhythm off drips  Urine output ok  BMET    Component Value Date/Time   NA 138 06/15/2018 1647   K 4.7 06/15/2018 1647   CL 106 06/15/2018 1647   CO2 22 06/15/2018 0402   GLUCOSE 154 (H) 06/15/2018 1647   BUN 11 06/15/2018 1647   CREATININE 1.33 (H) 06/15/2018 1649   CALCIUM 7.7 (L) 06/15/2018 0402   GFRNONAA 42 (L) 06/15/2018 1649   GFRAA 49 (L) 06/15/2018 1649   CBC    Component Value Date/Time   WBC 10.0 06/15/2018 1649   RBC 4.22 06/15/2018 1649   HGB 11.3 (L) 06/15/2018 1649   HCT 36.7 06/15/2018 1649   PLT 127 (L) 06/15/2018 1649   MCV 87.0 06/15/2018 1649   MCH 26.8 06/15/2018 1649   MCHC 30.8 06/15/2018 1649   RDW 15.5 06/15/2018 1649

## 2018-06-16 ENCOUNTER — Inpatient Hospital Stay (HOSPITAL_COMMUNITY): Payer: Managed Care, Other (non HMO)

## 2018-06-16 DIAGNOSIS — N183 Chronic kidney disease, stage 3 (moderate): Secondary | ICD-10-CM

## 2018-06-16 DIAGNOSIS — Z951 Presence of aortocoronary bypass graft: Secondary | ICD-10-CM

## 2018-06-16 DIAGNOSIS — E1122 Type 2 diabetes mellitus with diabetic chronic kidney disease: Secondary | ICD-10-CM

## 2018-06-16 DIAGNOSIS — E785 Hyperlipidemia, unspecified: Secondary | ICD-10-CM

## 2018-06-16 LAB — GLUCOSE, CAPILLARY
Glucose-Capillary: 125 mg/dL — ABNORMAL HIGH (ref 70–99)
Glucose-Capillary: 137 mg/dL — ABNORMAL HIGH (ref 70–99)
Glucose-Capillary: 172 mg/dL — ABNORMAL HIGH (ref 70–99)
Glucose-Capillary: 254 mg/dL — ABNORMAL HIGH (ref 70–99)
Glucose-Capillary: 158 mg/dL — ABNORMAL HIGH (ref 70–99)
Glucose-Capillary: 197 mg/dL — ABNORMAL HIGH (ref 70–99)

## 2018-06-16 LAB — CBC
HCT: 37.1 % (ref 36.0–46.0)
Hemoglobin: 11.5 g/dL — ABNORMAL LOW (ref 12.0–15.0)
MCH: 27.1 pg (ref 26.0–34.0)
MCHC: 31 g/dL (ref 30.0–36.0)
MCV: 87.3 fL (ref 80.0–100.0)
NRBC: 0 % (ref 0.0–0.2)
Platelets: 121 10*3/uL — ABNORMAL LOW (ref 150–400)
RBC: 4.25 MIL/uL (ref 3.87–5.11)
RDW: 15.8 % — ABNORMAL HIGH (ref 11.5–15.5)
WBC: 10.6 10*3/uL — ABNORMAL HIGH (ref 4.0–10.5)

## 2018-06-16 LAB — BASIC METABOLIC PANEL
Anion gap: 10 (ref 5–15)
BUN: 10 mg/dL (ref 8–23)
CO2: 24 mmol/L (ref 22–32)
Calcium: 8.5 mg/dL — ABNORMAL LOW (ref 8.9–10.3)
Chloride: 107 mmol/L (ref 98–111)
Creatinine, Ser: 1.38 mg/dL — ABNORMAL HIGH (ref 0.44–1.00)
GFR calc Af Amer: 47 mL/min — ABNORMAL LOW (ref >60)
GFR calc non Af Amer: 41 mL/min — ABNORMAL LOW (ref >60)
Glucose, Bld: 127 mg/dL — ABNORMAL HIGH (ref 70–99)
Potassium: 4.8 mmol/L (ref 3.5–5.1)
Sodium: 141 mmol/L (ref 135–145)

## 2018-06-16 MED ORDER — AMIODARONE HCL 200 MG PO TABS
200.0000 mg | ORAL_TABLET | Freq: Two times a day (BID) | ORAL | Status: DC
  Administered 2018-06-16: 200 mg via ORAL
  Filled 2018-06-16: qty 1

## 2018-06-16 MED ORDER — INSULIN ASPART 100 UNIT/ML ~~LOC~~ SOLN
0.0000 [IU] | Freq: Three times a day (TID) | SUBCUTANEOUS | Status: DC
  Administered 2018-06-16: 2 [IU] via SUBCUTANEOUS
  Administered 2018-06-16: 12 [IU] via SUBCUTANEOUS
  Administered 2018-06-17: 2 [IU] via SUBCUTANEOUS

## 2018-06-16 MED ORDER — AMIODARONE IV BOLUS ONLY 150 MG/100ML
150.0000 mg | Freq: Once | INTRAVENOUS | Status: AC
  Administered 2018-06-16: 150 mg via INTRAVENOUS
  Filled 2018-06-16: qty 100

## 2018-06-16 MED ORDER — METOPROLOL TARTRATE 25 MG/10 ML ORAL SUSPENSION: 50.0000 mg | Freq: Two times a day (BID) | ORAL | Status: DC

## 2018-06-16 MED ORDER — METOPROLOL TARTRATE 50 MG PO TABS
50.0000 mg | ORAL_TABLET | Freq: Two times a day (BID) | ORAL | Status: DC
  Administered 2018-06-16: 50 mg via ORAL
  Filled 2018-06-16: qty 1

## 2018-06-16 MED ORDER — AMLODIPINE BESYLATE 5 MG PO TABS
5.0000 mg | ORAL_TABLET | Freq: Every day | ORAL | Status: DC
  Administered 2018-06-16 – 2018-06-17 (×2): 5 mg via ORAL
  Filled 2018-06-16 (×2): qty 1

## 2018-06-16 MED ORDER — FENOFIBRATE 160 MG PO TABS
160.0000 mg | ORAL_TABLET | Freq: Every day | ORAL | Status: DC
  Administered 2018-06-16 – 2018-06-21 (×6): 160 mg via ORAL
  Filled 2018-06-16 (×6): qty 1

## 2018-06-16 MED ORDER — FUROSEMIDE 10 MG/ML IJ SOLN
40.0000 mg | Freq: Two times a day (BID) | INTRAMUSCULAR | Status: AC
  Administered 2018-06-16 (×2): 40 mg via INTRAVENOUS
  Filled 2018-06-16 (×2): qty 4

## 2018-06-16 MED ORDER — METOPROLOL TARTRATE 50 MG PO TABS
75.0000 mg | ORAL_TABLET | Freq: Two times a day (BID) | ORAL | Status: DC
  Administered 2018-06-16 – 2018-06-18 (×4): 75 mg via ORAL
  Filled 2018-06-16 (×4): qty 1

## 2018-06-16 NOTE — Progress Notes (Signed)
Patient ID: Cassidy Green, female   DOB: August 07, 1955, 63 y.o.   MRN: [id number] [facility] Evening Rounds:  Hypertensive and tachycardic today so Lopressor increased to 75 bid. Tonight she is having bursts of atrial fib 120-130's. Will give her a bolus of amio and start po.  She feels fine and has been ambulating.

## 2018-06-16 NOTE — Progress Notes (Signed)
2 Days Post-Op Procedure(s) (LRB): CORONARY ARTERY BYPASS GRAFTING (CABG) x4  using left internal mammary artery and right greater saphenous vein harvested endoscopically. (N/A) TRANSESOPHAGEAL ECHOCARDIOGRAM (TEE) (N/A) Subjective: No complaints. Walked this am. Pain under control  Objective: Vital signs in last 24 hours: Temp:  [97.6 F (36.4 C)-99 F (37.2 C)] 98.3 F (36.8 C) (01/19 0801) Pulse Rate:  [82-112] 99 (01/19 0700) Cardiac Rhythm: Sinus tachycardia (01/19 0400) Resp:  [13-26] 21 (01/19 0700) BP: (116-184)/(76-110) 145/85 (01/19 0700) SpO2:  [95 %-100 %] 96 % (01/19 0700) Arterial Line BP: (144-158)/(70-76) 158/76 (01/18 1000) Weight:  [68.9 kg] 68.9 kg (01/19 0500)  Hemodynamic parameters for last 24 hours: PAP: (42)/(22) 42/22  Intake/Output from previous day: 01/18 0701 - 01/19 0700 In: 1279 [P.O.:670; I.V.:409.2; IV Piggyback:199.9] Out: 2870 [Urine:2750; Chest Tube:120] Intake/Output this shift: No intake/output data recorded.  General appearance: alert and cooperative Neurologic: intact Heart: regular rate and rhythm, S1, S2 normal, no murmur, click, rub or gallop Lungs: diminished breath sounds bibasilar Extremities: edema mild Wound: dressing dry  Lab Results: Recent Labs    06/15/18 1649 06/16/18 0417  WBC 10.0 10.6*  HGB 11.3* 11.5*  HCT 36.7 37.1  PLT 127* 121*   BMET:  Recent Labs    06/15/18 0402 06/15/18 1647 06/15/18 1649 06/16/18 0417  NA 138 138  --  141  K 3.7 4.7  --  4.8  CL 106 106  --  107  CO2 22  --   --  24  GLUCOSE 99 154*  --  127*  BUN 9 11  --  10  CREATININE 1.04* 1.30* 1.33* 1.38*  CALCIUM 7.7*  --   --  8.5*    PT/INR:  Recent Labs    06/14/18 1615  LABPROT 15.9*  INR 1.29   ABG    Component Value Date/Time   PHART 7.362 06/15/2018 0006   HCO3 22.9 06/15/2018 0006   TCO2 23 06/15/2018 1647   ACIDBASEDEF 2.0 06/15/2018 0006   O2SAT 92.0 06/15/2018 0006   CBG (last 3)  Recent Labs     06/15/18 2336 06/16/18 0409 06/16/18 0803  GLUCAP 157* 125* 137*   CLINICAL DATA:  CABG  EXAM: PORTABLE CHEST 1 VIEW  COMPARISON:  06/15/2018  FINDINGS: Swan-Ganz catheter removed. Sheath remains in place. Left chest tube removed. Mediastinal drain removed.  Slight increase in bibasilar atelectasis. Small effusions. No pneumothorax or edema.  IMPRESSION: Progressive bibasilar atelectasis/effusion.  Negative for edema  Negative for pneumothorax.   Electronically Signed   By: Marlan Palau M.D.   On: 06/16/2018 07:36  Assessment/Plan: S/P Procedure(s) (LRB): CORONARY ARTERY BYPASS GRAFTING (CABG) x4  using left internal mammary artery and right greater saphenous vein harvested endoscopically. (N/A) TRANSESOPHAGEAL ECHOCARDIOGRAM (TEE) (N/A)  POD 2 Hemodynamically stable but tends to be hypertensive and baseline HR 90's with increase on mobilization. Flirting with AF. Will increase Lopressor to 50 bid.  Volume excess: diuresed well yesterday and wt down 5 lbs but still about 8 lbs over preop and has some pleural effusion and mild edema. Creat up slightly but will diurese further today. K+ level ok and will continue supplement.  Keep sleeve and foley in today since diuresing and may need for amio if she goes into AF.  Continue IS and ambulation   Repeat labs and CXR in am.     LOS: 4 days    Alleen Borne 06/16/2018

## 2018-06-16 NOTE — Progress Notes (Addendum)
Progress Note  Patient Name: Cassidy CandySusan S Green Date of Encounter: 06/16/2018  Primary Cardiologist: Donato SchultzMark Skains, MD   Subjective   She is doing great, no chest pain, minimal pain in the sternotomy would, mild SOB while walking.   Inpatient Medications    Scheduled Meds: . acetaminophen  1,000 mg Oral Q6H   Or  . acetaminophen (TYLENOL) oral liquid 160 mg/5 mL  1,000 mg Per Tube Q6H  . aspirin EC  325 mg Oral Daily   Or  . aspirin  324 mg Per Tube Daily  . bisacodyl  10 mg Oral Daily   Or  . bisacodyl  10 mg Rectal Daily  . Chlorhexidine Gluconate Cloth  6 each Topical Daily  . docusate sodium  200 mg Oral Daily  . enoxaparin (LOVENOX) injection  40 mg Subcutaneous QHS  . ezetimibe  10 mg Oral Daily  . fluticasone  1 spray Each Nare Daily  . folic acid  1 mg Oral Daily  . furosemide  40 mg Intravenous BID  . insulin aspart  0-24 Units Subcutaneous TID WC  . insulin detemir  10 Units Subcutaneous Daily  . insulin detemir  10 Units Subcutaneous Daily  . loratadine  10 mg Oral Daily  . mouth rinse  15 mL Mouth Rinse BID  . metoprolol tartrate  50 mg Oral BID   Or  . metoprolol tartrate  50 mg Per Tube BID  . omega-3 acid ethyl esters  2 g Oral BID  . pantoprazole  40 mg Oral Daily  . sodium chloride flush  10-40 mL Intracatheter Q12H  . sodium chloride flush  3 mL Intravenous Q12H   Continuous Infusions: . sodium chloride Stopped (06/14/18 2225)  . sodium chloride 250 mL (06/15/18 0518)  . sodium chloride 10 mL/hr at 06/14/18 1615  . cefUROXime (ZINACEF)  IV Stopped (06/16/18 0259)  . lactated ringers Stopped (06/14/18 1851)  . lactated ringers 20 mL/hr at 06/16/18 0400  . nitroGLYCERIN Stopped (06/15/18 1016)   PRN Meds: sodium chloride, methocarbamol, metoprolol tartrate, morphine injection, ondansetron (ZOFRAN) IV, oxyCODONE, sodium chloride flush, sodium chloride flush, traMADol   Vital Signs    Vitals:   06/16/18 0600 06/16/18 0700 06/16/18 0801 06/16/18 1014   BP: (!) 164/95 (!) 145/85  (!) 155/97  Pulse: 97 99  (!) 110  Resp: (!) 23 (!) 21    Temp:   98.3 F (36.8 C)   TempSrc:   Oral   SpO2: 96% 96%    Weight:      Height:        Intake/Output Summary (Last 24 hours) at 06/16/2018 1110 Last data filed at 06/16/2018 0600 Gross per 24 hour  Intake 960.87 ml  Output 2545 ml  Net -1584.13 ml   Last 3 Weights 06/16/2018 06/15/2018 06/14/2018  Weight (lbs) 151 lb 14.4 oz 157 lb 3 oz 143 lb 11.2 oz  Weight (kg) 68.9 kg 71.3 kg 65.182 kg      Telemetry    Sinus rhythm, short runs of a-fib, PACs- Personally Reviewed  ECG    No new ECG- Personally Reviewed  Physical Exam   GEN: No acute distress.   Neck: No JVD Cardiac: RRR, tachy, no murmurs, rubs, or gallops.  Strenotomy wound Respiratory: decreased breathing sounds at bases bilaterally. GI: Soft, nontender, non-distended  MS: No edema; No deformity. Neuro:  Nonfocal  Psych: Normal affect   Labs    Chemistry Recent Labs  Lab 06/14/18 0659  06/15/18 0402 06/15/18  1647 06/15/18 1649 06/16/18 0417  NA 142   < > 138 138  --  141  K 4.6   < > 3.7 4.7  --  4.8  CL 113*   < > 106 106  --  107  CO2 20*  --  22  --   --  24  GLUCOSE 136*   < > 99 154*  --  127*  BUN 15   < > 9 11  --  10  CREATININE 1.43*   < > 1.04* 1.30* 1.33* 1.38*  CALCIUM 9.2  --  7.7*  --   --  8.5*  GFRNONAA 39*   < > 57*  --  42* 41*  GFRAA 45*   < > >60  --  49* 47*  ANIONGAP 9  --  10  --   --  10   < > = values in this interval not displayed.     Hematology Recent Labs  Lab 06/15/18 0402 06/15/18 1647 06/15/18 1649 06/16/18 0417  WBC 12.7*  --  10.0 10.6*  RBC 4.10  --  4.22 4.25  HGB 11.5* 11.9* 11.3* 11.5*  HCT 34.8* 35.0* 36.7 37.1  MCV 84.9  --  87.0 87.3  MCH 28.0  --  26.8 27.1  MCHC 33.0  --  30.8 31.0  RDW 14.8  --  15.5 15.8*  PLT 165  --  127* 121*    Cardiac EnzymesNo results for input(s): TROPONINI in the last 168 hours. No results for input(s): TROPIPOC in the last  168 hours.   BNPNo results for input(s): BNP, PROBNP in the last 168 hours.   DDimer No results for input(s): DDIMER in the last 168 hours.   Radiology    Dg Chest Port 1 View  Result Date: 06/16/2018 CLINICAL DATA:  CABG EXAM: PORTABLE CHEST 1 VIEW COMPARISON:  06/15/2018 FINDINGS: Swan-Ganz catheter removed. Sheath remains in place. Left chest tube removed. Mediastinal drain removed. Slight increase in bibasilar atelectasis. Small effusions. No pneumothorax or edema. IMPRESSION: Progressive bibasilar atelectasis/effusion.  Negative for edema Negative for pneumothorax. Electronically Signed   By: Marlan Palau M.D.   On: 06/16/2018 07:36   Dg Chest Port 1 View  Result Date: 06/15/2018 CLINICAL DATA:  Status postextubation EXAM: PORTABLE CHEST 1 VIEW COMPARISON:  June 14, 2018 FINDINGS: Endotracheal tube and nasogastric tube have been removed. There remains a chest tube on the left as well as a mediastinal drain. Swan-Ganz catheter tip is in the main right pulmonary artery. Temporary pacemaker wires are attached to the right heart. There is no appreciable pneumothorax. There is a right pleural effusion, new. There is bibasilar atelectasis. Heart is mildly enlarged with pulmonary vascularity normal, stable. Status post coronary artery bypass grafting. IMPRESSION: Tube and catheter positions as described without pneumothorax. New right pleural effusion. Bibasilar atelectasis. No frank consolidation. Stable cardiac silhouette. Electronically Signed   By: Bretta Bang III M.D.   On: 06/15/2018 07:24   Dg Chest Port 1 View  Result Date: 06/14/2018 CLINICAL DATA:  Status post coronary bypass graft. EXAM: PORTABLE CHEST 1 VIEW COMPARISON:  Radiographs of June 13, 2018. FINDINGS: Stable cardiomediastinal silhouette. Endotracheal and nasogastric tubes are in grossly good position. Left-sided chest tube is noted without pneumothorax. Right internal jugular Swan-Ganz catheter is noted with tip  directed into right pulmonary artery. Right lung is clear. Mild left basilar atelectasis is noted. Bony thorax is unremarkable. IMPRESSION: Endotracheal and nasogastric tubes in grossly good position. Left-sided chest  tube is noted without pneumothorax. Mild left basilar atelectasis is noted. Electronically Signed   By: Lupita Raider, M.D.   On: 06/14/2018 16:34    Cardiac Studies   Nuclear stress test at [facility] showed LVEF of 31% and reversible large defect including the inferior wall and apex  Echocardiogram 06/12/2018  - Left ventricle: The cavity size was normal. Wall thickness was   normal. Systolic function was normal. The estimated ejection   fraction was in the range of 50% to 55%. Moderate hypokinesis of   the basalinferior myocardium. Features are consistent with a   pseudonormal left ventricular filling pattern, with concomitant   abnormal relaxation and increased filling pressure (grade 2   diastolic dysfunction).  Cardiac catheterization 06/12/2018  Ost RPDA to RPDA lesion is 99% stenosed.  Mid RCA lesion is 50% stenosed.  Mid RCA to Dist RCA lesion is 80% stenosed.  Dist LM to Prox LAD lesion is 90% stenosed.  Prox LAD lesion is 80% stenosed.  Mid LAD to Dist LAD lesion is 100% stenosed.  Ost Cx to Prox Cx lesion is 70% stenosed.   Severe multivessel CAD with 90% near ostial LAD stenosis followed by 80% stenosis after the first diagonal vessel which had 40% stenoses in the diagonal vessel.  The mid LAD is subtotal/totally occluded and there is collateralization from septal perforators supplying the apical portion of the LAD which wraps around the apex.  There is also collateralization to the distal RCA via the left coronary injection ;  70% proximal eccentric circumflex stenosis; right coronary artery with diffuse mid 50% narrowing followed by diffuse 80% stenosis beyond the acute margin and 99% PDA stenosis with evidence for both antegrade and retrograde filling of  the distal PDA.  Mild LV dysfunction with EF at 50% with focal mild basal inferior hypocontractility.  LVEDP 17 mm.  RECOMMENDATION: Surgical consultation for consideration of CABG revascularization.  In light of the patient's significant disease, she will be restarted on heparin 8 hours post sheath discontinuance.  If patient is intolerant to statins, consider alternative such as Zetia and possible PCSK9 inhibition.  Diagnostic  Dominance: Right        Patient Profile     63 y.o. female with multivessel coronary artery disease awaiting bypass.  Assessment & Plan    Coronary artery disease -s/p CABG x 4 on 06/14/18, the patient is doing great, started to walk, minimal pain in the sternotomy wound  Hypertension - agree with increasing metoprolol, she got 50 mg this morning and her heart rate is still around 100 and she has very short runs of a-fib, blood pressure 146 mmHg, I would further increase metoprolol to 75 mg p.o. twice daily -Begin at [facility]B if her creatinine improves, otherwise low-dose Imdur or amlodipine would be good options.  Bilateral pleural effusions -Agree with IV Lasix, monitor creatinine closely.  CKD stage III -Creatinine 1.3.  Hyperlipidemia  -Severely elevated triglycerides, direct LDL 198, see you thank you so much agree with starting Lovaza, zetia, I would add fenofibrate, and refer to our lipid clinic after discharge for PCSK9 inhibitors..  For questions or updates, please contact [facility] Please consult [email or web address] for contact info under     Signed, Tobias Alexander, MD  06/16/2018, 11:10 AM

## 2018-06-17 ENCOUNTER — Inpatient Hospital Stay (HOSPITAL_COMMUNITY): Payer: Managed Care, Other (non HMO)

## 2018-06-17 LAB — BASIC METABOLIC PANEL
Anion gap: 12 (ref 5–15)
BUN: 16 mg/dL (ref 8–23)
CALCIUM: 8.4 mg/dL — AB (ref 8.9–10.3)
CO2: 24 mmol/L (ref 22–32)
Chloride: 103 mmol/L (ref 98–111)
Creatinine, Ser: 1.48 mg/dL — ABNORMAL HIGH (ref 0.44–1.00)
GFR calc Af Amer: 43 mL/min — ABNORMAL LOW (ref >60)
GFR, EST NON AFRICAN AMERICAN: 37 mL/min — AB (ref >60)
Glucose, Bld: 129 mg/dL — ABNORMAL HIGH (ref 70–99)
Potassium: 4.1 mmol/L (ref 3.5–5.1)
Sodium: 139 mmol/L (ref 135–145)

## 2018-06-17 LAB — BPAM RBC
Blood Product Expiration Date: 202002112359
Blood Product Expiration Date: 202002122359
Blood Product Expiration Date: 202002132359
Blood Product Expiration Date: 202002132359
Blood Product Expiration Date: 202002132359
Blood Product Expiration Date: 202002132359
ISSUE DATE / TIME: 202001171114
ISSUE DATE / TIME: 202001171306
ISSUE DATE / TIME: 202001190121
ISSUE DATE / TIME: 202001190537
ISSUE DATE / TIME: 202001171114
Unit Type and Rh: 7300
Unit Type and Rh: 7300
Unit Type and Rh: 7300
Unit Type and Rh: 7300
Unit Type and Rh: 7300
Unit Type and Rh: 7300

## 2018-06-17 LAB — TYPE AND SCREEN
ABO/RH(D): B POS
Antibody Screen: NEGATIVE
Unit division: 0
Unit division: 0
Unit division: 0
Unit division: 0
Unit division: 0
Unit division: 0

## 2018-06-17 LAB — CBC
HCT: 36.3 % (ref 36.0–46.0)
Hemoglobin: 11.3 g/dL — ABNORMAL LOW (ref 12.0–15.0)
MCH: 27.6 pg (ref 26.0–34.0)
MCHC: 31.1 g/dL (ref 30.0–36.0)
MCV: 88.8 fL (ref 80.0–100.0)
PLATELETS: 147 10*3/uL — AB (ref 150–400)
RBC: 4.09 MIL/uL (ref 3.87–5.11)
RDW: 15.6 % — ABNORMAL HIGH (ref 11.5–15.5)
WBC: 10.3 10*3/uL (ref 4.0–10.5)
nRBC: 0 % (ref 0.0–0.2)

## 2018-06-17 LAB — GLUCOSE, CAPILLARY
Glucose-Capillary: 129 mg/dL — ABNORMAL HIGH (ref 70–99)
Glucose-Capillary: 177 mg/dL — ABNORMAL HIGH (ref 70–99)
Glucose-Capillary: 208 mg/dL — ABNORMAL HIGH (ref 70–99)
Glucose-Capillary: 145 mg/dL — ABNORMAL HIGH (ref 70–99)

## 2018-06-17 MED ORDER — INSULIN ASPART 100 UNIT/ML ~~LOC~~ SOLN
0.0000 [IU] | Freq: Three times a day (TID) | SUBCUTANEOUS | Status: DC
  Administered 2018-06-18: 2 [IU] via SUBCUTANEOUS
  Administered 2018-06-18: 3 [IU] via SUBCUTANEOUS
  Administered 2018-06-18: 2 [IU] via SUBCUTANEOUS
  Administered 2018-06-19 (×2): 5 [IU] via SUBCUTANEOUS
  Administered 2018-06-19: 2 [IU] via SUBCUTANEOUS
  Administered 2018-06-20 (×2): 3 [IU] via SUBCUTANEOUS
  Administered 2018-06-21: 2 [IU] via SUBCUTANEOUS

## 2018-06-17 MED ORDER — SODIUM CHLORIDE 0.9 % IV SOLN: 250.0000 mL | INTRAVENOUS | Status: DC | PRN

## 2018-06-17 MED ORDER — SODIUM CHLORIDE 0.9% FLUSH
3.0000 mL | Freq: Two times a day (BID) | INTRAVENOUS | Status: DC
  Administered 2018-06-17 – 2018-06-20 (×7): 3 mL via INTRAVENOUS

## 2018-06-17 MED ORDER — SODIUM CHLORIDE 0.9% FLUSH: 3.0000 mL | INTRAVENOUS | Status: DC | PRN

## 2018-06-17 MED ORDER — ZOLPIDEM TARTRATE 5 MG PO TABS
5.0000 mg | ORAL_TABLET | Freq: Every evening | ORAL | Status: DC | PRN
  Administered 2018-06-19 – 2018-06-20 (×2): 5 mg via ORAL
  Filled 2018-06-17 (×2): qty 1

## 2018-06-17 MED ORDER — MOVING RIGHT ALONG BOOK
Freq: Once | Status: AC
  Administered 2018-06-17: 10:00:00
  Filled 2018-06-17: qty 1

## 2018-06-17 MED ORDER — AMIODARONE HCL 200 MG PO TABS
400.0000 mg | ORAL_TABLET | Freq: Two times a day (BID) | ORAL | Status: DC
  Administered 2018-06-17 – 2018-06-21 (×9): 400 mg via ORAL
  Filled 2018-06-17 (×9): qty 2

## 2018-06-17 MED ORDER — FUROSEMIDE 40 MG PO TABS
40.0000 mg | ORAL_TABLET | Freq: Every day | ORAL | Status: DC
  Administered 2018-06-17 – 2018-06-18 (×2): 40 mg via ORAL
  Filled 2018-06-17 (×2): qty 1

## 2018-06-17 MED ORDER — INSULIN DETEMIR 100 UNIT/ML ~~LOC~~ SOLN
20.0000 [IU] | Freq: Every day | SUBCUTANEOUS | Status: DC
  Administered 2018-06-17 – 2018-06-21 (×5): 20 [IU] via SUBCUTANEOUS
  Filled 2018-06-17 (×5): qty 0.2

## 2018-06-17 NOTE — Progress Notes (Signed)
3 Days Post-Op Procedure(s) (LRB): CORONARY ARTERY BYPASS GRAFTING (CABG) x4  using left internal mammary artery and right greater saphenous vein harvested endoscopically. (N/A) TRANSESOPHAGEAL ECHOCARDIOGRAM (TEE) (N/A) Subjective: No complaints this morning  Objective: Vital signs in last 24 hours: Temp:  [98.3 F (36.8 C)-98.9 F (37.2 C)] 98.9 F (37.2 C) (01/20 0400) Pulse Rate:  [52-113] 94 (01/20 0700) Cardiac Rhythm: Atrial fibrillation;Sinus tachycardia (01/20 0400) Resp:  [16-34] 21 (01/20 0700) BP: (72-164)/(42-142) 72/42 (01/20 0700) SpO2:  [81 %-99 %] 98 % (01/20 0700) Weight:  [65.7 kg] 65.7 kg (01/20 0500)  Hemodynamic parameters for last 24 hours:    Intake/Output from previous day: 01/19 0701 - 01/20 0700 In: 1257 [P.O.:1080; I.V.:177] Out: 3485 [Urine:3485] Intake/Output this shift: No intake/output data recorded.  General appearance: alert, cooperative and no distress Neurologic: intact Heart: regular rate and rhythm Lungs: diminished breath sounds bibasilar Abdomen: normal findings: soft, non-tender  Lab Results: Recent Labs    06/16/18 0417 06/17/18 0346  WBC 10.6* 10.3  HGB 11.5* 11.3*  HCT 37.1 36.3  PLT 121* 147*   BMET:  Recent Labs    06/16/18 0417 06/17/18 0346  NA 141 139  K 4.8 4.1  CL 107 103  CO2 24 24  GLUCOSE 127* 129*  BUN 10 16  CREATININE 1.38* 1.48*  CALCIUM 8.5* 8.4*    PT/INR:  Recent Labs    06/14/18 1615  LABPROT 15.9*  INR 1.29   ABG    Component Value Date/Time   PHART 7.362 06/15/2018 0006   HCO3 22.9 06/15/2018 0006   TCO2 23 06/15/2018 1647   ACIDBASEDEF 2.0 06/15/2018 0006   O2SAT 92.0 06/15/2018 0006   CBG (last 3)  Recent Labs    06/16/18 1540 06/16/18 2143 06/17/18 0652  GLUCAP 158* 197* 129*    Assessment/Plan: S/P Procedure(s) (LRB): CORONARY ARTERY BYPASS GRAFTING (CABG) x4  using left internal mammary artery and right greater saphenous vein harvested endoscopically.  (N/A) TRANSESOPHAGEAL ECHOCARDIOGRAM (TEE) (N/A) -Doing well overall CV- still in and out of atrial fib on metoprolol and amiodarone  Will increase amiodarone to 400 BID  Hold ACE-I until renal function back to baseline RESP- is for atelectasis, diuresis for small B effusions RENAL- creatinine up slightly with aggressive diuresis yesterday  Near baseline weight. Lasix 40 PO daily  Dc Foley, follow creatinine ENDO- CBG elevated- will increase levemir  Janumet on hold until renal function improves SCD + enoxaparin for DVT prophylaxis Continue cardiac rehab   LOS: 5 days    [name] 06/17/2018

## 2018-06-17 NOTE — Progress Notes (Signed)
Patient ID: Cassidy Green, female   DOB: Sep 27, 1955, 63 y.o.   MRN: [id number] EVENING ROUNDS NOTE :     [address]       [name] [address]             [phone]                 3 Days Post-Op Procedure(s) (LRB): CORONARY ARTERY BYPASS GRAFTING (CABG) x4  using left internal mammary artery and right greater saphenous vein harvested endoscopically. (N/A) TRANSESOPHAGEAL ECHOCARDIOGRAM (TEE) (N/A)  Total Length of Stay:  LOS: 5 days  BP 135/87   Pulse 99   Temp 98.7 F (37.1 C) (Oral)   Resp (!) 24   Ht 5\' 3"  (1.6 m)   Wt 65.7 kg   SpO2 94%   BMI 25.66 kg/m   .Intake/Output      01/19 0701 - 01/20 0700 01/20 0701 - 01/21 0700   P.O. 1080    I.V. (mL/kg) 177 (2.7)    IV Piggyback     Total Intake(mL/kg) 1257 (19.1)    Urine (mL/kg/hr) 3485 (2.2) 100 (0.1)   Chest Tube     Total Output 3485 100   Net -2228 -100        Urine Occurrence  4 x     . sodium chloride       Lab Results  Component Value Date   WBC 10.3 06/17/2018   HGB 11.3 (L) 06/17/2018   HCT 36.3 06/17/2018   PLT 147 (L) 06/17/2018   GLUCOSE 129 (H) 06/17/2018   CHOL 351 (H) 06/12/2018   TRIG 656 (H) 06/12/2018   HDL 27 (L) 06/12/2018   LDLDIRECT 198 (H) 06/12/2018   LDLCALC UNABLE TO CALCULATE IF TRIGLYCERIDE OVER 400 mg/dL 08/67/6195   ALT 12 09/32/6712   AST 27 07/21/2014   NA 139 06/17/2018   K 4.1 06/17/2018   CL 103 06/17/2018   CREATININE 1.48 (H) 06/17/2018   BUN 16 06/17/2018   CO2 24 06/17/2018   INR 1.29 06/14/2018   HGBA1C 7.6 (H) 06/12/2018   Walking , in and out afib this am, cordrone increased to 400 bid Ambulating around the unit  Glucose still elevated    Delight Ovens MD  Beeper [phone] Office [phone] 06/17/2018 5:34 PM

## 2018-06-17 NOTE — Anesthesia Postprocedure Evaluation (Signed)
Anesthesia Post Note  Patient: Cassidy Green  Procedure(s) Performed: CORONARY ARTERY BYPASS GRAFTING (CABG) x4  using left internal mammary artery and right greater saphenous vein harvested endoscopically. (N/A Chest) TRANSESOPHAGEAL ECHOCARDIOGRAM (TEE) (N/A )     Patient location during evaluation: SICU Anesthesia Type: General Level of consciousness: awake and alert Pain management: pain level controlled Vital Signs Assessment: post-procedure vital signs reviewed and stable Respiratory status: spontaneous breathing and patient connected to nasal cannula oxygen Cardiovascular status: stable Postop Assessment: no apparent nausea or vomiting Anesthetic complications: no    Last Vitals:  Vitals:   06/17/18 0500 06/17/18 0600  BP: 128/80 (!) 142/76  Pulse: 94 95  Resp: 16 (!) 23  Temp:    SpO2: 93% 98%    Last Pain:  Vitals:   06/17/18 0400  TempSrc: Oral  PainSc: Asleep                 Zanobia Griebel,W. EDMOND

## 2018-06-18 ENCOUNTER — Inpatient Hospital Stay (HOSPITAL_COMMUNITY): Payer: Managed Care, Other (non HMO)

## 2018-06-18 ENCOUNTER — Encounter (HOSPITAL_COMMUNITY): Payer: Self-pay | Admitting: Thoracic Surgery (Cardiothoracic Vascular Surgery)

## 2018-06-18 LAB — COMPREHENSIVE METABOLIC PANEL
ALBUMIN: 3.7 g/dL (ref 3.5–5.0)
ALT: 18 U/L (ref 0–44)
AST: 23 U/L (ref 15–41)
Alkaline Phosphatase: 55 U/L (ref 38–126)
Anion gap: 13 (ref 5–15)
BUN: 30 mg/dL — ABNORMAL HIGH (ref 8–23)
CO2: 24 mmol/L (ref 22–32)
Calcium: 9.3 mg/dL (ref 8.9–10.3)
Chloride: 102 mmol/L (ref 98–111)
Creatinine, Ser: 2.03 mg/dL — ABNORMAL HIGH (ref 0.44–1.00)
GFR calc Af Amer: 29 mL/min — ABNORMAL LOW (ref >60)
GFR calc non Af Amer: 25 mL/min — ABNORMAL LOW (ref >60)
GLUCOSE: 146 mg/dL — AB (ref 70–99)
Potassium: 4.1 mmol/L (ref 3.5–5.1)
SODIUM: 139 mmol/L (ref 135–145)
Total Bilirubin: 0.7 mg/dL (ref 0.3–1.2)
Total Protein: 7.1 g/dL (ref 6.5–8.1)

## 2018-06-18 LAB — BASIC METABOLIC PANEL
Anion gap: 10 (ref 5–15)
BUN: 22 mg/dL (ref 8–23)
CO2: 26 mmol/L (ref 22–32)
Calcium: 9.2 mg/dL (ref 8.9–10.3)
Chloride: 104 mmol/L (ref 98–111)
Creatinine, Ser: 1.6 mg/dL — ABNORMAL HIGH (ref 0.44–1.00)
GFR calc Af Amer: 39 mL/min — ABNORMAL LOW (ref >60)
GFR calc non Af Amer: 34 mL/min — ABNORMAL LOW (ref >60)
Glucose, Bld: 115 mg/dL — ABNORMAL HIGH (ref 70–99)
Potassium: 4 mmol/L (ref 3.5–5.1)
Sodium: 140 mmol/L (ref 135–145)

## 2018-06-18 LAB — CBC
HCT: 38.3 % (ref 36.0–46.0)
HCT: 38.2 % (ref 36.0–46.0)
Hemoglobin: 11.8 g/dL — ABNORMAL LOW (ref 12.0–15.0)
Hemoglobin: 12.2 g/dL (ref 12.0–15.0)
MCH: 26.8 pg (ref 26.0–34.0)
MCH: 27.6 pg (ref 26.0–34.0)
MCHC: 30.8 g/dL (ref 30.0–36.0)
MCHC: 31.9 g/dL (ref 30.0–36.0)
MCV: 86.8 fL (ref 80.0–100.0)
MCV: 86.4 fL (ref 80.0–100.0)
Platelets: 184 10*3/uL (ref 150–400)
Platelets: 213 10*3/uL (ref 150–400)
RBC: 4.41 MIL/uL (ref 3.87–5.11)
RBC: 4.42 MIL/uL (ref 3.87–5.11)
RDW: 15.3 % (ref 11.5–15.5)
RDW: 15.2 % (ref 11.5–15.5)
WBC: 9.3 10*3/uL (ref 4.0–10.5)
WBC: 10.9 10*3/uL — ABNORMAL HIGH (ref 4.0–10.5)
nRBC: 0 % (ref 0.0–0.2)
nRBC: 0 % (ref 0.0–0.2)

## 2018-06-18 LAB — POCT I-STAT 7, (LYTES, BLD GAS, ICA,H+H)
ACID-BASE EXCESS: 1 mmol/L (ref 0.0–2.0)
Bicarbonate: 25.6 mmol/L (ref 20.0–28.0)
Calcium, Ion: 1.17 mmol/L (ref 1.15–1.40)
HCT: 35 % — ABNORMAL LOW (ref 36.0–46.0)
Hemoglobin: 11.9 g/dL — ABNORMAL LOW (ref 12.0–15.0)
O2 Saturation: 95 %
Patient temperature: 98
Potassium: 3.7 mmol/L (ref 3.5–5.1)
SODIUM: 139 mmol/L (ref 135–145)
TCO2: 27 mmol/L (ref 22–32)
pCO2 arterial: 39 mmHg (ref 32.0–48.0)
pH, Arterial: 7.424 (ref 7.350–7.450)
pO2, Arterial: 74 mmHg — ABNORMAL LOW (ref 83.0–108.0)

## 2018-06-18 LAB — GLUCOSE, CAPILLARY
GLUCOSE-CAPILLARY: 143 mg/dL — AB (ref 70–99)
Glucose-Capillary: 131 mg/dL — ABNORMAL HIGH (ref 70–99)
Glucose-Capillary: 165 mg/dL — ABNORMAL HIGH (ref 70–99)

## 2018-06-18 MED ORDER — LINAGLIPTIN 5 MG PO TABS
5.0000 mg | ORAL_TABLET | Freq: Every day | ORAL | Status: DC
  Administered 2018-06-18 – 2018-06-21 (×4): 5 mg via ORAL
  Filled 2018-06-18 (×4): qty 1

## 2018-06-18 MED ORDER — DILTIAZEM HCL ER COATED BEADS 120 MG PO CP24
120.0000 mg | ORAL_CAPSULE | Freq: Every day | ORAL | Status: DC
  Administered 2018-06-18: 120 mg via ORAL
  Filled 2018-06-18: qty 1

## 2018-06-18 MED FILL — Lidocaine HCl(Cardiac) IV PF Soln Pref Syr 100 MG/5ML (2%): INTRAVENOUS | Qty: 5 | Status: AC

## 2018-06-18 MED FILL — Electrolyte-R (PH 7.4) Solution: INTRAVENOUS | Qty: 3000 | Status: AC

## 2018-06-18 MED FILL — Sodium Bicarbonate IV Soln 8.4%: INTRAVENOUS | Qty: 50 | Status: AC

## 2018-06-18 MED FILL — Heparin Sodium (Porcine) Inj 1000 Unit/ML: INTRAMUSCULAR | Qty: 10 | Status: AC

## 2018-06-18 MED FILL — Sodium Chloride IV Soln 0.9%: INTRAVENOUS | Qty: 2000 | Status: AC

## 2018-06-18 MED FILL — Magnesium Sulfate Inj 50%: INTRAMUSCULAR | Qty: 10 | Status: AC

## 2018-06-18 MED FILL — Heparin Sodium (Porcine) Inj 1000 Unit/ML: INTRAMUSCULAR | Qty: 30 | Status: AC

## 2018-06-18 MED FILL — Potassium Chloride Inj 2 mEq/ML: INTRAVENOUS | Qty: 40 | Status: AC

## 2018-06-18 MED FILL — Mannitol IV Soln 20%: INTRAVENOUS | Qty: 500 | Status: AC

## 2018-06-18 NOTE — Progress Notes (Signed)
4 Days Post-Op Procedure(s) (LRB): CORONARY ARTERY BYPASS GRAFTING (CABG) x4  using left internal mammary artery and right greater saphenous vein harvested endoscopically. (N/A) TRANSESOPHAGEAL ECHOCARDIOGRAM (TEE) (N/A) Subjective: No complaints this AM, appetite improving, denies pain  Objective: Vital signs in last 24 hours: Temp:  [98.2 F (36.8 C)-99.4 F (37.4 C)] 98.6 F (37 C) (01/21 0400) Pulse Rate:  [82-111] 96 (01/21 0700) Cardiac Rhythm: Normal sinus rhythm (01/21 0400) Resp:  [15-28] 19 (01/21 0620) BP: (111-137)/(72-96) 126/76 (01/21 0700) SpO2:  [86 %-100 %] 92 % (01/21 0700) Weight:  [64.7 kg] 64.7 kg (01/21 0617)  Hemodynamic parameters for last 24 hours:    Intake/Output from previous day: 01/20 0701 - 01/21 0700 In: 480 [P.O.:480] Out: 1150 [Urine:1150] Intake/Output this shift: No intake/output data recorded.  General appearance: alert, cooperative and no distress Neurologic: intact Heart: regular, mild tachycardia Lungs: diminished breath sounds bibasilar Wound: clean and dry  Lab Results: Recent Labs    06/17/18 0346 06/18/18 0345  WBC 10.3 9.3  HGB 11.3* 11.8*  HCT 36.3 38.3  PLT 147* 184   BMET:  Recent Labs    06/17/18 0346 06/18/18 0345  NA 139 140  K 4.1 4.0  CL 103 104  CO2 24 26  GLUCOSE 129* 115*  BUN 16 22  CREATININE 1.48* 1.60*  CALCIUM 8.4* 9.2    PT/INR: No results for input(s): LABPROT, INR in the last 72 hours. ABG    Component Value Date/Time   PHART 7.362 06/15/2018 0006   HCO3 22.9 06/15/2018 0006   TCO2 23 06/15/2018 1647   ACIDBASEDEF 2.0 06/15/2018 0006   O2SAT 92.0 06/15/2018 0006   CBG (last 3)  Recent Labs    06/17/18 1544 06/17/18 2132 06/18/18 0657  GLUCAP 208* 145* 131*    Assessment/Plan: S/P Procedure(s) (LRB): CORONARY ARTERY BYPASS GRAFTING (CABG) x4  using left internal mammary artery and right greater saphenous vein harvested endoscopically. (N/A) TRANSESOPHAGEAL ECHOCARDIOGRAM  (TEE) (N/A) Plan for transfer to step-down: see transfer orders  CV- in sinus rhythm, still mildly tachycardic  Continue amiodarone and lopressor  Will dc norvasc and use diltiazem to help with BP and HR  RESP- still with small effusions and bibasilar atelectasis- continue IS  RENAL- mild diuresis yesterday, creatinine still mildly elevated from baseline stage III CKD  ENDO- CBG still elevated, continue levemir + SSI  Was on Janumet at home. On hold due to creatinine. Will start linagliptin (substitute for sitagliptin)   Continue cardiac rehab   LOS: 6 days    [name] 06/18/2018

## 2018-06-18 NOTE — Discharge Summary (Addendum)
Physician Discharge Summary       [address]       [name] [address]             [phone]     Patient ID: JAHNVI MODLIN MRN: [id number] DOB/AGE: 02/05/1956 63 y.o.  Admit date: 06/12/2018 Discharge date: 06/21/2018  Admission Diagnoses: Unstable angina pectoris  Discharge Diagnoses:  Multivessel coronary artery disease Unstable angina Abnormal nuclear stress test Type 2 diabetes mellitus Dyslipidemia Hypertension Stage III CKD History of pancreatitis Postoperative atrial fibrillation  Discharged Condition: stable  Hospital Course:  63 yo woman presents with a cc/o of subscapular pain with shortness of breath and nausea.  Mrs. Pello is a 63 yo woman with a past history of type 2 diabetes, hypertension, hyperlipidemia, stage III CKD, reflux, arthritis, spinal stenosis, remote tobacco abuse (quit 18 years ago), anemia and anxiety. She has no prior cardiac history. In August 2019 she began experiencing subscapular pain associated with shortness of breath and nausea and sometimes with vomiting. She saw her primary and nothing serious was noted. Her pain resolved until about 2 weeks prior to this admission when she began having the same symptoms that initially occurred with exertion, then could come on at any time. Symptoms became more frequent, lasted longer and became more intense.  She went back to her doctor. A nuclear stress test showed an EF of 31% and a large reversible defect in the apex and inferior wall. An echo showed an EF of 50% with inferior basal hypokinesis. No significant valve pathology.   He was referred for left heart catheterization which was carried out on 06/13/2018 and demonstrated severe three-vessel coronary artery disease.  She was then referred to Dr. Dorris Fetch for consideration of coronary bypass grafting.  After review of her clinical findings, coronary bypass grafting was offered and she elected to proceed.  She was taken to the  operating room on 64/17/2019 where four-vessel coronary bypass grafting was carried out without complication.  Following the procedure, she was transferred to the surgical ICU in stable condition.  He remained hemodynamically stable.  She was extubated routinely.  He was weaned from vasoactive drips without difficulty.  She developed atrial fibrillation that was managed with IV amiodarone resulting in conversion back to sinus rhythm.  She was converted to oral amiodarone. She continued to have some hild sinus tachycardia with concurrent BP in 130-158mmHg range so her metoprolol was advanced to 75mg  po BID.   Her glucose was managed with a combination of sliding scale insulin and long-acting insulin.  Control was adequate.  She otherwise admitted progressive and uneventful recovery.  By the time of discharge, she was tolerating  A consistent carbohydrate cardiac diet, ambulating independently and was having appropriate bowel and bladder function.  She was maintaining normal O2 saturations on RA. Her incision sere healing with no sign of complication. Arrangements were made for visits by a home health RN through [facility] prior to discharge.  Consults: Cardiology consultation by Dr. Rollene Rotunda for rhythm management.  Significant Diagnostic Studies:  Left Heart CATH  06/12/18  Ost RPDA to RPDA lesion is 99% stenosed.  Mid RCA lesion is 50% stenosed.  Mid RCA to Dist RCA lesion is 80% stenosed.  Dist LM to Prox LAD lesion is 90% stenosed.  Prox LAD lesion is 80% stenosed.  Mid LAD to Dist LAD lesion is 100% stenosed.  Ost Cx to Prox Cx lesion is 70% stenosed.   Severe multivessel CAD with 90%  near ostial LAD stenosis followed by 80% stenosis after the first diagonal vessel which had 40% stenoses in the diagonal vessel.  The mid LAD is subtotal/totally occluded and there is collateralization from septal perforators supplying the apical portion of the LAD which wraps around the apex.   There is also collateralization to the distal RCA via the left coronary injection ;  70% proximal eccentric circumflex stenosis; right coronary artery with diffuse mid 50% narrowing followed by diffuse 80% stenosis beyond the acute margin and 99% PDA stenosis with evidence for both antegrade and retrograde filling of the distal PDA.  Mild LV dysfunction with EF at 50% with focal mild basal inferior hypocontractility.  LVEDP 17 mm.  RECOMMENDATION: Surgical consultation for consideration of CABG revascularization.  In light of the patient's significant disease, she will be restarted on heparin 8 hours post sheath discontinuance.  If patient is intolerant to statins, consider alternative such as Zetia and possible PCSK9 inhibition.   ECHOCARDIOGRAM *[place]*                   *[facility]*                         [address]                        [place], [place] [address]                            [phone]  ------------------------------------------------------------------- Transthoracic Echocardiography  Patient:    Ercel, Normoyle MR #:       [id number] Study Date: 06/12/2018 Gender:     F Age:        63 Height:     160 cm Weight:     65.2 kg BSA:        1.72 m^2 Pt. Status: Room:       6E03C   PERFORMING   [facility], Inpatient  ADMITTING    Almon Hercules  ATTENDING    Almon Hercules  SONOGRAPHER  Dance, Tiffany  ORDERING     Wilmot, Kobina A  REFERRING    [place], [place] A  cc:  ------------------------------------------------------------------- LV EF: 50% -   55%  ------------------------------------------------------------------- Indications:      Chest pain 786.51.  ------------------------------------------------------------------- History:   Risk factors:  GERD. Hypertension. Diabetes mellitus. Dyslipidemia.  ------------------------------------------------------------------- Study Conclusions  - Left ventricle: The cavity size  was normal. Wall thickness was   normal. Systolic function was normal. The estimated ejection   fraction was in the range of 50% to 55%. Moderate hypokinesis of   the basalinferior myocardium. Features are consistent with a   pseudonormal left ventricular filling pattern, with concomitant   abnormal relaxation and increased filling pressure (grade 2   diastolic dysfunction).  ------------------------------------------------------------------- Study data:  Comparison was made to the study of 02/12/2018.  Study status:  Routine.  Procedure:  The patient reported no pain pre or post test. Transthoracic echocardiography. Image quality was adequate.  Study completion:  There were no complications. Transthoracic echocardiography.  M-mode, complete 2D, spectral Doppler, and color Doppler.  Birthdate:  Patient birthdate: 31-Jan-1956.  Age:  Patient is 63 yr old.  Sex:  Gender: female. BMI: 25.5 kg/m^2.  Blood pressure:     141/79  Patient status: Inpatient.  Study date:  Study date: 06/12/2018. Study time: 12:40 PM.  Location:  Bedside.  -------------------------------------------------------------------  ------------------------------------------------------------------- Left ventricle:  The cavity size was normal. Wall thickness was normal. Systolic function was normal. The estimated ejection fraction was in the range of 50% to 55%.  Regional wall motion abnormalities:   Moderate hypokinesis of the basalinferior myocardium. Features are consistent with a pseudonormal left ventricular filling pattern, with concomitant abnormal relaxation and increased filling pressure (grade 2 diastolic dysfunction).  ------------------------------------------------------------------- Aortic valve:   Structurally normal valve.   Cusp separation was normal.  Doppler:  Transvalvular velocity was within the normal range. There was no stenosis. There was no  regurgitation.  ------------------------------------------------------------------- Aorta:  Aortic root: The aortic root was normal in size. Ascending aorta: The ascending aorta was normal in size.  ------------------------------------------------------------------- Mitral valve:   Structurally normal valve.   Leaflet separation was normal.  Doppler:  Transvalvular velocity was within the normal range. There was no evidence for stenosis. There was trivial regurgitation.    Peak gradient (D): 3 mm Hg.  ------------------------------------------------------------------- Left atrium:  The atrium was normal in size.  ------------------------------------------------------------------- Right ventricle:  The cavity size was normal. Systolic function was normal.  ------------------------------------------------------------------- Pulmonic valve:    The valve appears to be grossly normal. Doppler:  There was no significant regurgitation.  ------------------------------------------------------------------- Tricuspid valve:   The valve appears to be grossly normal. Doppler:  There was trivial regurgitation.  ------------------------------------------------------------------- Right atrium:  The atrium was normal in size.  ------------------------------------------------------------------- Pericardium:  There was no pericardial effusion.  ------------------------------------------------------------------- Systemic veins: Inferior vena cava: The vessel was normal in size. The respirophasic diameter changes were in the normal range (>= 50%), consistent with normal central venous pressure.  Treatments:  OPERATIVE REPORT  DATE OF PROCEDURE:  06/14/2018  PREOPERATIVE DIAGNOSIS:  Severe 3-vessel coronary disease with unstable angina.  POSTOPERATIVE DIAGNOSIS:  Severe 3-vessel coronary disease with unstable angina.  PROCEDURE:  Median sternotomy, extracorporeal circulation  coronary artery bypass grafting x4 (left internal mammary artery to mid left anterior descending, saphenous vein graft to distal descending, sequential saphenous vein graft to first diagonal and  obtuse marginal), endoscopic vein harvest right leg, sternal plating with KLS Martin plates.  SURGEON:  Charlett LangoSteven Hendrickson, MD  ASSISTANT:  Lowella DandyErin Barrett.  ANESTHESIA:  General.  FINDINGS:  Transesophageal echocardiography revealed preserved systolic function, left ventricular hypertrophy, mild mitral regurgitation, distal LAD fair quality target.  Remaining targets good quality.  Posterior descending ungraftable.  Vein and  mammary artery both good quality.  Discharge Exam: Blood pressure 129/74, pulse (!) 101, temperature 98.7 F (37.1 C), temperature source Oral, resp. rate 18, height 5\' 3"  (1.6 m), weight 63.7 kg, SpO2 96 %.   General appearance: alert, cooperative and no distress Resp: clear to auscultation bilaterally Cardio: regular rate and rhythm Incision/Wound: all incisions are well approximated and healing with no sing of complication.  The sternum is satble.  Disposition: Discharge disposition: 01-Home or Self Care       Discharge Instructions    Amb Referral to Cardiac Rehabilitation   Complete by:  As directed    Referring to [place] CRP 2   Diagnosis:  CABG   CABG X ___:  4     Allergies as of 06/21/2018      Reactions   Statins Other (See Comments)    " I cant recall the name of the statin, but I had joint pain with one of the statins "      Medication List    STOP taking these medications   BC  HEADACHE POWDER PO   cetirizine-pseudoephedrine 5-120 MG tablet Commonly known as:  ZYRTEC-D Replaced by:  loratadine 10 MG tablet   diclofenac 75 MG EC tablet Commonly known as:  VOLTAREN   docusate sodium 100 MG capsule Commonly known as:  COLACE   lisinopril 20 MG tablet Commonly known as:  PRINIVIL,ZESTRIL   ondansetron 4 MG tablet Commonly known  as:  ZOFRAN   oxyCODONE-acetaminophen 10-325 MG tablet Commonly known as:  PERCOCET     TAKE these medications   albuterol 108 (90 Base) MCG/ACT inhaler Commonly known as:  PROVENTIL HFA;VENTOLIN HFA Inhale 2 puffs into the lungs every 4 (four) hours as needed for wheezing.   amiodarone 200 MG tablet Commonly known as:  PACERONE Take 1 tablet (200 mg total) by mouth 2 (two) times daily.   aspirin 325 MG EC tablet Take 1 tablet (325 mg total) by mouth daily.   buPROPion 100 MG tablet Commonly known as:  WELLBUTRIN Take 100 mg by mouth daily.   ezetimibe 10 MG tablet Commonly known as:  ZETIA Take 1 tablet (10 mg total) by mouth at bedtime.   fenofibrate 160 MG tablet Take 1 tablet (160 mg total) by mouth daily.   Fish Oil 1200 MG Caps Take 1,200 mg by mouth daily.   fluticasone 50 MCG/ACT nasal spray Commonly known as:  FLONASE Place 1 spray into both nostrils daily.   folic acid 1 MG tablet Commonly known as:  FOLVITE Take 1 mg by mouth at bedtime.   HEMOCYTE PO Take 1 tablet by mouth daily.   loratadine 10 MG tablet Commonly known as:  CLARITIN Take 1 tablet (10 mg total) by mouth daily. Replaces:  cetirizine-pseudoephedrine 5-120 MG tablet   magnesium gluconate 500 MG tablet Commonly known as:  MAGONATE Take 500 mg by mouth daily.   methocarbamol 500 MG tablet Commonly known as:  ROBAXIN Take 1 tablet (500 mg total) by mouth 3 (three) times daily as needed for muscle spasms.   metoprolol tartrate 50 MG tablet Commonly known as:  LOPRESSOR Take 1.5 tablets (75 mg total) by mouth 2 (two) times daily.   omeprazole 40 MG capsule Commonly known as:  PRILOSEC Take 40 mg by mouth at bedtime.   sitaGLIPtin-metformin 50-1000 MG tablet Commonly known as:  JANUMET Take 1 tablet by mouth at bedtime.   traMADol 50 MG tablet Commonly known as:  ULTRAM Take 1 tablet (50 mg total) by mouth every 6 (six) hours as needed for up to 7 days for moderate pain.       Follow-up Information    [name], MD. Go to.   Specialty:  Cardiothoracic Surgery Why:  Follow up appointment scheduled for 07/23/18 at 12:45pm.  Please arrive prior to appointment for chest X-ray on the first floor of the same building.  Contact information: [address] [place] [address] [phone]        Rosalio Macadamia, NP. Go to.   Specialties:  Nurse Practitioner, Interventional Cardiology, Cardiology, Radiology Why:  Appointment scheduled for Wednesday 07/03/18 at 11am. Contact information: [address] [place] [address] [phone]        [facility], [facility]-Home Follow up.   Specialty:  Home Health Services Why:  Home Health Registered Nurse and Physical Therapy Contact information: [address] [place] [address] [phone]          The patient has been discharged on:   1.Beta Blocker:  Yes [  x ]                              No   [   ]                              If No, reason:  2.Ace Inhibitor/ARB: Yes [   ]                                     No  [ x   ]                                     If No, reason:  Acute on chronic renal insufficiency  3.Statin:   Yes [   ]                  No  [   ]                  If No, reason: Allergy  4.Marlowe KaysValentino Hue  [ x  ]                  No   [   ]                  If No, reason:    Signed: Leary Roca, PA-C 06/21/2018, 10:52 AM

## 2018-06-18 NOTE — Progress Notes (Signed)
   06/18/18 2300  Vitals  BP 115/82  MAP (mmHg) 94  BP Location Right Arm  BP Method Automatic  Patient Position (if appropriate) Lying  Pulse Rate (!) 137  Pulse Rate Source Monitor  ECG Heart Rate (!) 138  Resp (!) 27  Oxygen Therapy  SpO2 97 %  pt was sleeping, I woke her up and she stated she felt find, denied any pulsations and pain. While conducting EKG, pt HR went back into 90's and NSR. Reassessed patient neuro status and WDL. Will continue to monitor. No meds given

## 2018-06-18 NOTE — Progress Notes (Signed)
[facility] BRIEF SICU PROGRESS NOTE  4 Days Post-Op  S/P Procedure(s) (LRB): CORONARY ARTERY BYPASS GRAFTING (CABG) x4  using left internal mammary artery and right greater saphenous vein harvested endoscopically. (N/A) TRANSESOPHAGEAL ECHOCARDIOGRAM (TEE) (N/A)   During a walk this evening patient became diaphoretic, dizzy and weak w/ SBP 80's No chest pain, SOB or other symptoms No change in rhythm - she has remained in NSR Follow up labs stable w/ Hgb 12.2 Symptoms resolved and BP normal after 1 bottle albumin  Plan: Will hold metoprolol this evening and consider stopping or decreasing Cardizem in the morning.  Keep in ICU for now.  Purcell Nails, MD 06/18/2018 8:23 PM

## 2018-06-18 NOTE — Progress Notes (Signed)
[facility] BRIEF SICU PROGRESS NOTE  4 Days Post-Op  S/P Procedure(s) (LRB): CORONARY ARTERY BYPASS GRAFTING (CABG) x4  using left internal mammary artery and right greater saphenous vein harvested endoscopically. (N/A) TRANSESOPHAGEAL ECHOCARDIOGRAM (TEE) (N/A)   Stable day  Plan: Awaiting bed for transfer  Purcell Nails, MD 06/18/2018 4:57 PM

## 2018-06-18 NOTE — Discharge Instructions (Signed)

## 2018-06-19 DIAGNOSIS — I483 Typical atrial flutter: Secondary | ICD-10-CM

## 2018-06-19 LAB — BASIC METABOLIC PANEL
Anion gap: 13 (ref 5–15)
BUN: 27 mg/dL — AB (ref 8–23)
CO2: 24 mmol/L (ref 22–32)
Calcium: 9.1 mg/dL (ref 8.9–10.3)
Chloride: 102 mmol/L (ref 98–111)
Creatinine, Ser: 1.7 mg/dL — ABNORMAL HIGH (ref 0.44–1.00)
GFR calc Af Amer: 37 mL/min — ABNORMAL LOW (ref >60)
GFR calc non Af Amer: 32 mL/min — ABNORMAL LOW (ref >60)
Glucose, Bld: 139 mg/dL — ABNORMAL HIGH (ref 70–99)
POTASSIUM: 3.7 mmol/L (ref 3.5–5.1)
Sodium: 139 mmol/L (ref 135–145)

## 2018-06-19 LAB — GLUCOSE, CAPILLARY
GLUCOSE-CAPILLARY: 220 mg/dL — AB (ref 70–99)
Glucose-Capillary: 142 mg/dL — ABNORMAL HIGH (ref 70–99)
Glucose-Capillary: 130 mg/dL — ABNORMAL HIGH (ref 70–99)
Glucose-Capillary: 224 mg/dL — ABNORMAL HIGH (ref 70–99)
Glucose-Capillary: 179 mg/dL — ABNORMAL HIGH (ref 70–99)

## 2018-06-19 MED ORDER — POTASSIUM CHLORIDE CRYS ER 20 MEQ PO TBCR
20.0000 meq | EXTENDED_RELEASE_TABLET | ORAL | Status: DC | PRN
  Administered 2018-06-19: 20 meq via ORAL
  Filled 2018-06-19: qty 1

## 2018-06-19 MED ORDER — METOPROLOL TARTRATE 50 MG PO TABS
50.0000 mg | ORAL_TABLET | Freq: Two times a day (BID) | ORAL | Status: DC
  Administered 2018-06-19 – 2018-06-20 (×3): 50 mg via ORAL
  Filled 2018-06-19 (×3): qty 1

## 2018-06-19 NOTE — Progress Notes (Signed)
   06/19/18 0030  Vitals  BP (!) 121/98  MAP (mmHg) 107  Pulse Rate (!) 136  Pulse Rate Source Monitor  ECG Heart Rate (!) 136  Resp (!) 23  Oxygen Therapy  SpO2 98 %  pt asleep, woke pt up, denies any palpations or pain, grab EKG and pt returned back to NSR at rate of 90's, will leave pt hook to EKG machine to capture dysrhythmia. Will continue to monitor and have labs drawn early.

## 2018-06-19 NOTE — Progress Notes (Signed)
5 Days Post-Op Procedure(s) (LRB): CORONARY ARTERY BYPASS GRAFTING (CABG) x4  using left internal mammary artery and right greater saphenous vein harvested endoscopically. (N/A) TRANSESOPHAGEAL ECHOCARDIOGRAM (TEE) (N/A) Subjective: Events of last night noted Became light headed and hypotensive when up yesterday afternoon Some brief episodes of atrial flutter over night  Objective: Vital signs in last 24 hours: Temp:  [97.7 F (36.5 C)-98.5 F (36.9 C)] 98.4 F (36.9 C) (01/22 0700) Pulse Rate:  [78-146] 94 (01/22 0700) Cardiac Rhythm: Atrial flutter;Sinus tachycardia (01/22 0530) Resp:  [13-27] 13 (01/22 0700) BP: (77-150)/(42-107) 120/71 (01/22 0700) SpO2:  [91 %-100 %] 100 % (01/22 0700) Weight:  [64.5 kg] 64.5 kg (01/22 0500)  Hemodynamic parameters for last 24 hours:    Intake/Output from previous day: 01/21 0701 - 01/22 0700 In: 800 [P.O.:800] Out: 1500 [Urine:1500] Intake/Output this shift: No intake/output data recorded.  General appearance: alert, cooperative and no distress Neurologic: intact Heart: regular rate and rhythm Lungs: diminished breath sounds bibasilar Abdomen: normal findings: soft, non-tender Wound: clean and dry  Lab Results: Recent Labs    06/18/18 0345 06/18/18 1928 06/18/18 1938  WBC 9.3  --  10.9*  HGB 11.8* 11.9* 12.2  HCT 38.3 35.0* 38.2  PLT 184  --  213   BMET:  Recent Labs    06/18/18 1938 06/19/18 0241  NA 139 139  K 4.1 3.7  CL 102 102  CO2 24 24  GLUCOSE 146* 139*  BUN 30* 27*  CREATININE 2.03* 1.70*  CALCIUM 9.3 9.1    PT/INR: No results for input(s): LABPROT, INR in the last 72 hours. ABG    Component Value Date/Time   PHART 7.424 06/18/2018 1928   HCO3 25.6 06/18/2018 1928   TCO2 27 06/18/2018 1928   ACIDBASEDEF 2.0 06/15/2018 0006   O2SAT 95.0 06/18/2018 1928   CBG (last 3)  Recent Labs    06/18/18 1654 06/18/18 1909 06/19/18 0701  GLUCAP 143* 142* 130*    Assessment/Plan: S/P Procedure(s)  (LRB): CORONARY ARTERY BYPASS GRAFTING (CABG) x4  using left internal mammary artery and right greater saphenous vein harvested endoscopically. (N/A) TRANSESOPHAGEAL ECHOCARDIOGRAM (TEE) (N/A) Plan for transfer to step-down: see transfer orders  CV- BP down significantly, still intermittent atrial flutter  Will decrease metoprolol dose and stop diltiazem  Continue amiodarone  ECG changes due to flutter, no evidence of acute MI  RESP- continue IS  RENAL- creatinine elevated at 1.7, down from 2.0 last night  Below preop weight, likely over diuresed- stop lasix for now  ENDO- CBG better controlled with restarting PO med  Continue cardiac rehab  SCD + enoxaparin for DVT prophylaxis   LOS: 7 days    Cassidy Green 06/19/2018

## 2018-06-19 NOTE — Progress Notes (Signed)
Progress Note  Patient Name: Cassidy Green Date of Encounter: 06/19/2018  Primary Cardiologist:   Donato Schultz, MD   Subjective   Continued issues with atrial flutter and hypotension.  Feels OK.  Says she does not feel the flutter.    Inpatient Medications    Scheduled Meds: . acetaminophen  1,000 mg Oral Q6H   Or  . acetaminophen (TYLENOL) oral liquid 160 mg/5 mL  1,000 mg Per Tube Q6H  . amiodarone  400 mg Oral BID  . aspirin EC  325 mg Oral Daily   Or  . aspirin  324 mg Per Tube Daily  . bisacodyl  10 mg Oral Daily   Or  . bisacodyl  10 mg Rectal Daily  . docusate sodium  200 mg Oral Daily  . enoxaparin (LOVENOX) injection  40 mg Subcutaneous QHS  . ezetimibe  10 mg Oral Daily  . fenofibrate  160 mg Oral Daily  . fluticasone  1 spray Each Nare Daily  . folic acid  1 mg Oral Daily  . insulin aspart  0-15 Units Subcutaneous TID WC  . insulin detemir  20 Units Subcutaneous Daily  . linagliptin  5 mg Oral Daily  . loratadine  10 mg Oral Daily  . mouth rinse  15 mL Mouth Rinse BID  . metoprolol tartrate  50 mg Oral BID  . omega-3 acid ethyl esters  2 g Oral BID  . pantoprazole  40 mg Oral Daily  . sodium chloride flush  3 mL Intravenous Q12H   Continuous Infusions: . sodium chloride     PRN Meds: sodium chloride, methocarbamol, metoprolol tartrate, ondansetron (ZOFRAN) IV, oxyCODONE, potassium chloride, sodium chloride flush, traMADol, zolpidem   Vital Signs    Vitals:   06/19/18 0900 06/19/18 1000 06/19/18 1100 06/19/18 1133  BP: 99/72 108/69 117/68   Pulse: (!) 107     Resp: 19 20 19    Temp:    98.7 F (37.1 C)  TempSrc:    Oral  SpO2: 100%     Weight:      Height:        Intake/Output Summary (Last 24 hours) at 06/19/2018 1200 Last data filed at 06/19/2018 0911 Gross per 24 hour  Intake 360 ml  Output 1250 ml  Net -890 ml   Filed Weights   06/17/18 0500 06/18/18 0617 06/19/18 0500  Weight: 65.7 kg 64.7 kg 64.5 kg    Telemetry    Paroxysmal  atrial flutter.  Otherwise NSR - Personally Reviewed  ECG    NA - Personally Reviewed  Physical Exam   GEN: No acute distress.   Neck: No  JVD Cardiac: RRR, n murmurs, rubs, or gallops.  Respiratory: Clear  to auscultation bilaterally. GI: Soft, nontender, non-distended  MS: No  edema; No deformity. Neuro:  Nonfocal  Psych: Normal affect   Labs    Chemistry Recent Labs  Lab 06/18/18 0345 06/18/18 1928 06/18/18 1938 06/19/18 0241  NA 140 139 139 139  K 4.0 3.7 4.1 3.7  CL 104  --  102 102  CO2 26  --  24 24  GLUCOSE 115*  --  146* 139*  BUN 22  --  30* 27*  CREATININE 1.60*  --  2.03* 1.70*  CALCIUM 9.2  --  9.3 9.1  PROT  --   --  7.1  --   ALBUMIN  --   --  3.7  --   AST  --   --  23  --  ALT  --   --  18  --   ALKPHOS  --   --  55  --   BILITOT  --   --  0.7  --   GFRNONAA 34*  --  25* 32*  GFRAA 39*  --  29* 37*  ANIONGAP 10  --  13 13     Hematology Recent Labs  Lab 06/17/18 0346 06/18/18 0345 06/18/18 1928 06/18/18 1938  WBC 10.3 9.3  --  10.9*  RBC 4.09 4.41  --  4.42  HGB 11.3* 11.8* 11.9* 12.2  HCT 36.3 38.3 35.0* 38.2  MCV 88.8 86.8  --  86.4  MCH 27.6 26.8  --  27.6  MCHC 31.1 30.8  --  31.9  RDW 15.6* 15.3  --  15.2  PLT 147* 184  --  213    Cardiac EnzymesNo results for input(s): TROPONINI in the last 168 hours. No results for input(s): TROPIPOC in the last 168 hours.   BNPNo results for input(s): BNP, PROBNP in the last 168 hours.   DDimer No results for input(s): DDIMER in the last 168 hours.   Radiology    Dg Chest 2 View  Result Date: 06/18/2018 CLINICAL DATA:  63 year old female postoperative day 4 status post CABG. EXAM: CHEST - 2 VIEW COMPARISON:  06/17/2018 and earlier. FINDINGS: PA and lateral views. Right IJ introducer sheath removed. Epicardial pacer wires remain. Pulmonary vascularity appears normal. No pneumothorax. There are small bilateral pleural effusions with streaky and patchy lung base opacity, likely  atelectasis. Stable mediastinal contours with sequelae of CABG. Stable visualized osseous structures. Negative visible bowel gas pattern. IMPRESSION: 1. Right IJ sheath removed.  Epicardial leads remain. 2. Small bilateral pleural effusions with atelectasis. Electronically Signed   By: Odessa FlemingH  Hall M.D.   On: 06/18/2018 06:55    Cardiac Studies   TEE:  Septum: No Patent Foramen Ovale present.  Left atrium: Patent foramen ovale not present.  Aortic valve: No AV vegetation.  Mitral valve: No leaflet thickening and calcification present. Trace regurgitation.  Right ventricle: Normal cavity size, wall thickness and ejection fraction. No thrombus present. No mass present.  Pulmonic valve: Trace regurgitation.   Diagnostic  Dominance: Right      Patient Profile     63 y.o. female with hypertension, hyperlipidemia, poorly controlled type 2 diabetes mellitus, and CKD stage III who initially presented to [facility] with unstable angina. [facility] was high risk with LVEF of 31% and a mostly reversible large defect involving the apex and inferior wall. Patient was [facility] for cardiac catheterization.   Now post CABG.    Assessment & Plan    ATRIAL FLUTTER:  Flutter earlier this AM.  Continue amiodarone. Agree with beta blocker. Off Cardizem.    DYSLIPIDEMIA:  She reports allergy to statin.  As an out patient we will arrange follow up in Lipid clinic and [id number]..  Continue current meds.   For questions or updates, please contact [facility] Please consult [email or web address] for contact info under Cardiology/STEMI.   Signed, Rollene RotundaJames Anglea Gordner, MD  06/19/2018, 12:00 PM

## 2018-06-19 NOTE — Progress Notes (Signed)
Pt went back into aflutter with HR 140's, she didn't sustained and return to NSR with HR 90's, blood pressure 108/73, K 3.7, replace with TCTS protocol, contacted Cornelius Moras and inform him of increase in HR and will continue to monitor.

## 2018-06-19 NOTE — Progress Notes (Signed)
CT surgery p.m. Rounds  Patient had a much better day maintaining sinus rhythm without atrial arrhythmia Walked around the ICU hallway Blood pressure 130/80 after medication changes Continue current care

## 2018-06-20 ENCOUNTER — Inpatient Hospital Stay (HOSPITAL_COMMUNITY): Payer: Managed Care, Other (non HMO)

## 2018-06-20 LAB — BASIC METABOLIC PANEL
Anion gap: 10 (ref 5–15)
BUN: 28 mg/dL — ABNORMAL HIGH (ref 8–23)
CO2: 25 mmol/L (ref 22–32)
Calcium: 9.3 mg/dL (ref 8.9–10.3)
Chloride: 104 mmol/L (ref 98–111)
Creatinine, Ser: 1.92 mg/dL — ABNORMAL HIGH (ref 0.44–1.00)
GFR calc Af Amer: 32 mL/min — ABNORMAL LOW (ref >60)
GFR calc non Af Amer: 27 mL/min — ABNORMAL LOW (ref >60)
Glucose, Bld: 116 mg/dL — ABNORMAL HIGH (ref 70–99)
Potassium: 4 mmol/L (ref 3.5–5.1)
Sodium: 139 mmol/L (ref 135–145)

## 2018-06-20 LAB — GLUCOSE, CAPILLARY
Glucose-Capillary: 168 mg/dL — ABNORMAL HIGH (ref 70–99)
Glucose-Capillary: 169 mg/dL — ABNORMAL HIGH (ref 70–99)
Glucose-Capillary: 69 mg/dL — ABNORMAL LOW (ref 70–99)
Glucose-Capillary: 76 mg/dL (ref 70–99)
Glucose-Capillary: 144 mg/dL — ABNORMAL HIGH (ref 70–99)

## 2018-06-20 MED ORDER — SODIUM CHLORIDE 0.9% FLUSH
3.0000 mL | Freq: Two times a day (BID) | INTRAVENOUS | Status: DC
  Administered 2018-06-20 – 2018-06-21 (×3): 3 mL via INTRAVENOUS

## 2018-06-20 MED ORDER — METOPROLOL TARTRATE 50 MG PO TABS
75.0000 mg | ORAL_TABLET | Freq: Two times a day (BID) | ORAL | Status: DC
  Administered 2018-06-20 – 2018-06-21 (×2): 75 mg via ORAL
  Filled 2018-06-20 (×2): qty 1

## 2018-06-20 MED ORDER — SODIUM CHLORIDE 0.9 % IV SOLN: 250.0000 mL | INTRAVENOUS | Status: DC | PRN

## 2018-06-20 MED ORDER — MOVING RIGHT ALONG BOOK
Freq: Once | Status: AC
  Administered 2018-06-20: 16:00:00
  Filled 2018-06-20: qty 1

## 2018-06-20 MED ORDER — SODIUM CHLORIDE 0.9% FLUSH: 3.0000 mL | INTRAVENOUS | Status: DC | PRN

## 2018-06-20 MED ORDER — INSULIN ASPART 100 UNIT/ML ~~LOC~~ SOLN
5.0000 [IU] | Freq: Three times a day (TID) | SUBCUTANEOUS | Status: DC
  Administered 2018-06-20 – 2018-06-21 (×2): 5 [IU] via SUBCUTANEOUS

## 2018-06-20 NOTE — Progress Notes (Addendum)
6 Days Post-Op Procedure(s) (LRB): CORONARY ARTERY BYPASS GRAFTING (CABG) x4  using left internal mammary artery and right greater saphenous vein harvested endoscopically. (N/A) TRANSESOPHAGEAL ECHOCARDIOGRAM (TEE) (N/A) Subjective: Awake and alert, OOB earlier this AM and just returned to bed.  Says she had a good day yesterday.  Walked in the unit. Appetite improving.  Objective: Vital signs in last 24 hours: Temp:  [98.3 F (36.8 C)-98.7 F (37.1 C)] 98.3 F (36.8 C) (01/23 0310) Pulse Rate:  [80-110] 90 (01/23 0500) Cardiac Rhythm: Normal sinus rhythm (01/23 0310) Resp:  [15-42] 20 (01/23 0700) BP: (74-147)/(30-103) 139/77 (01/23 0310) SpO2:  [93 %-100 %] 95 % (01/23 0500) Weight:  [64.2 kg] 64.2 kg (01/23 0500)    Intake/Output from previous day: 01/22 0701 - 01/23 0700 In: 1680 [P.O.:1680] Out: 350 [Urine:350]  General appearance: alert, cooperative and no distress Heart: Sinus rhythm with no further afib / flutter since yesterday AM. S. Tach now at 103/min. Lungs: Breath sounds clear. Good sats on RA. Extremities: No edema. Right LE EVH incisions dry, well approximated.  Wound: Sternotomy incision is dry and healing with no sign of complication.  Lab Results: Recent Labs    06/18/18 0345 06/18/18 1928 06/18/18 1938  WBC 9.3  --  10.9*  HGB 11.8* 11.9* 12.2  HCT 38.3 35.0* 38.2  PLT 184  --  213   BMET:  Recent Labs    06/19/18 0241 06/20/18 0247  NA 139 139  K 3.7 4.0  CL 102 104  CO2 24 25  GLUCOSE 139* 116*  BUN 27* 28*  CREATININE 1.70* 1.92*  CALCIUM 9.1 9.3    PT/INR: No results for input(s): LABPROT, INR in the last 72 hours. ABG    Component Value Date/Time   PHART 7.424 06/18/2018 1928   HCO3 25.6 06/18/2018 1928   TCO2 27 06/18/2018 1928   ACIDBASEDEF 2.0 06/15/2018 0006   O2SAT 95.0 06/18/2018 1928   CBG (last 3)  Recent Labs    06/19/18 1522 06/19/18 2117 06/20/18 0635  GLUCAP 220* 179* 168*    Assessment/Plan: S/P  Procedure(s) (LRB): CORONARY ARTERY BYPASS GRAFTING (CABG) x4  using left internal mammary artery and right greater saphenous vein harvested endoscopically. (N/A) TRANSESOPHAGEAL ECHOCARDIOGRAM (TEE) (N/A)  1. POD6 CABGx4. Making good progress, independent with mobility. OK to transfer to intermediate care.  2. Post-op afib / flutter. On oral amiodarone load and metoprolol 50mg  po BID. Still mildly tachycardic and appears to have enough BP reserve to increase BB. Will defer to cardiology. 3. Dyslipidemia, statin intolerance.  Continue Zetia, fenofibrate. 4. Mild post-op renal insufficiency. Creat trending down. Recheck lab in AM 5. Type II DM-Control adquate on levemir, SSI, and linagliptin. No change in mgt.   Disposition: Ready for discharge soon.  Pt lives a good distance away in [place] so it may be wise to monitor rhythm one more day.   LOS: 8 days    Leary Roca, [place] [phone] 06/20/2018 Patient seen and examined. Looks great Acute kidney injury in setting of stage III CKD- creatinine up slightly to 1.9 today Continue to hold diuretics, recheck in AM CBG still elevated after meals- will add meal coverage with novolog  Viviann Spare C. Dorris Fetch, MD [facility] [phone]

## 2018-06-20 NOTE — Progress Notes (Signed)
Progress Note  Patient Name: Cassidy Green Date of Encounter: 06/20/2018  Primary Cardiologist:   Donato Schultz, MD   Subjective   Mild chest soreness.  No SOB.   Inpatient Medications    Scheduled Meds: . amiodarone  400 mg Oral BID  . aspirin EC  325 mg Oral Daily   Or  . aspirin  324 mg Per Tube Daily  . bisacodyl  10 mg Oral Daily   Or  . bisacodyl  10 mg Rectal Daily  . docusate sodium  200 mg Oral Daily  . enoxaparin (LOVENOX) injection  40 mg Subcutaneous QHS  . ezetimibe  10 mg Oral Daily  . fenofibrate  160 mg Oral Daily  . fluticasone  1 spray Each Nare Daily  . folic acid  1 mg Oral Daily  . insulin aspart  0-15 Units Subcutaneous TID WC  . insulin aspart  5 Units Subcutaneous TID WC  . insulin detemir  20 Units Subcutaneous Daily  . linagliptin  5 mg Oral Daily  . loratadine  10 mg Oral Daily  . mouth rinse  15 mL Mouth Rinse BID  . metoprolol tartrate  50 mg Oral BID  . omega-3 acid ethyl esters  2 g Oral BID  . pantoprazole  40 mg Oral Daily  . sodium chloride flush  3 mL Intravenous Q12H   Continuous Infusions: . sodium chloride     PRN Meds: sodium chloride, methocarbamol, metoprolol tartrate, ondansetron (ZOFRAN) IV, oxyCODONE, potassium chloride, sodium chloride flush, traMADol, zolpidem   Vital Signs    Vitals:   06/20/18 1130 06/20/18 1140 06/20/18 1145 06/20/18 1200  BP: 128/78  (!) 117/98 136/80  Pulse:      Resp: 20  20 13   Temp:  98.3 F (36.8 C)    TempSrc:  Oral    SpO2:      Weight:      Height:        Intake/Output Summary (Last 24 hours) at 06/20/2018 1309 Last data filed at 06/20/2018 0841 Gross per 24 hour  Intake 1680 ml  Output -  Net 1680 ml   Filed Weights   06/18/18 0617 06/19/18 0500 06/20/18 0500  Weight: 64.7 kg 64.5 kg 64.2 kg    Telemetry    NSR - Personally Reviewed  ECG    NA - Personally Reviewed  Physical Exam   GEN: No acute distress.   Neck: No  JVD Cardiac: RRR, no murmurs, rubs, or  gallops.  Respiratory: Clear  to auscultation bilaterally. GI: Soft, nontender, non-distended  MS: No  edema; No deformity. Neuro:  Nonfocal  Psych: Normal affect  Labs    Chemistry Recent Labs  Lab 06/18/18 1938 06/19/18 0241 06/20/18 0247  NA 139 139 139  K 4.1 3.7 4.0  CL 102 102 104  CO2 24 24 25   GLUCOSE 146* 139* 116*  BUN 30* 27* 28*  CREATININE 2.03* 1.70* 1.92*  CALCIUM 9.3 9.1 9.3  PROT 7.1  --   --   ALBUMIN 3.7  --   --   AST 23  --   --   ALT 18  --   --   ALKPHOS 55  --   --   BILITOT 0.7  --   --   GFRNONAA 25* 32* 27*  GFRAA 29* 37* 32*  ANIONGAP 13 13 10      Hematology Recent Labs  Lab 06/17/18 0346 06/18/18 0345 06/18/18 1928 06/18/18 1938  WBC 10.3 9.3  --  10.9*  RBC 4.09 4.41  --  4.42  HGB 11.3* 11.8* 11.9* 12.2  HCT 36.3 38.3 35.0* 38.2  MCV 88.8 86.8  --  86.4  MCH 27.6 26.8  --  27.6  MCHC 31.1 30.8  --  31.9  RDW 15.6* 15.3  --  15.2  PLT 147* 184  --  213    Cardiac EnzymesNo results for input(s): TROPONINI in the last 168 hours. No results for input(s): TROPIPOC in the last 168 hours.   BNPNo results for input(s): BNP, PROBNP in the last 168 hours.   DDimer No results for input(s): DDIMER in the last 168 hours.   Radiology    Dg Chest 2 View  Result Date: 06/20/2018 CLINICAL DATA:  Soreness from surgery EXAM: CHEST - 2 VIEW COMPARISON:  Two days ago FINDINGS: Lower lobe atelectasis, improving. Trace pleural effusions. Stable postoperative heart size. No pneumothorax. IMPRESSION: Atelectasis and small pleural effusions. Aeration has improved since 2 days ago. Electronically Signed   By: Marnee SpringJonathon  Watts M.D.   On: 06/20/2018 07:27    Cardiac Studies   TEE:  Septum: No Patent Foramen Ovale present.  Left atrium: Patent foramen ovale not present.  Aortic valve: No AV vegetation.  Mitral valve: No leaflet thickening and calcification present. Trace regurgitation.  Right ventricle: Normal cavity size, wall thickness  and ejection fraction. No thrombus present. No mass present.  Pulmonic valve: Trace regurgitation.   Diagnostic  Dominance: Right     Patient Profile     63 y.o. female with hypertension, hyperlipidemia, poorly controlled type 2 diabetes mellitus, and CKD stage III who initially presented to [facility] with unstable angina. [facility] at [facility] was high risk with LVEF of 31% and a mostly reversible large defect involving the apex and inferior wall. Patient was transferred [facility] for cardiac catheter[facility].  Now post CABG.   Assessment & Plan    ATRIAL FLUTTER:  No further episodes with beta blocker and amiodarone.  HR still increased.   Increase beta blocker.   CKD III:    Creat up slightly.  Follow.   For questions or updates, please contact [facility] Please consult [email or web address] for contact info under Cardiology/STEMI.   Signed, Rollene RotundaJames Alline Pio, MD  06/20/2018, 1:09 PM

## 2018-06-20 NOTE — Progress Notes (Signed)
CARDIAC REHAB PHASE I   PRE:  Rate/Rhythm: 101 ST  BP:  Supine: 132/61  Sitting:   Standing:    SaO2: 96%RA  MODE:  Ambulation: 740 ft   POST:  Rate/Rhythm: 126 ST  BP:  Supine:   Sitting: 153/81  Standing:    SaO2: 95%RA 0930-0950 Assisted pt to bathroom and then walked 740 ft on RA with rolling walker. Has walker at home if needed. No DOE noted and tolerated well. To recliner after walk. Pt stated that she does not have anyone to stay with her next week as her husband and daughter work. She asked about Home Services. Needs to see case manager to help finalize discharge plan.   Luetta Nutting, RN BSN  06/20/2018 9:44 AM

## 2018-06-20 NOTE — Care Management Note (Addendum)
Case Management Note  Patient Details  Name: Cassidy Green MRN: [id number] Date of Birth: 1956/01/21  Subjective/Objective:  63 yo female presented with CP, SOB and nausea; s/p cath which showed MVD; s/p CABG  1/17.              Action/Plan: CM consult acknowledged for DCP needs. CM met with patient to discuss dispositional needs. Patient reports living at home with her spouse and being independent with her ADLs PTA; employed at [employer]. PCP verified as: Dr. Celedonio Miyamoto; pharmacy of choice: [facility]. CM discussed post-surgical home needs; per patient, her spouse/daughter are unable to stay with her next week d/t their jobs, but patient indicated her great granddaughter, aged 73 can sit with her and provide PRN assistance until 1600 each day next week, patient also agreeable to HHRN/PT. CMS HH compare list provided with no preference as long as in-network with her insurance Psychologist, counselling). [place] referral sent to Adonis Brook, [facility] liaison; AVS updated. Patients family will provide transportation home. CM will provide patient with a [employer] on [employer] for private pay resources as requested. No further needs from CM.  Expected Discharge Date:                  Expected Discharge Plan:  Home/Self Care  In-House Referral:  NA  Discharge planning Services  CM Consult  Post Acute Care Choice:  Home Health Choice offered to:  Patient  DME Arranged:  N/A DME Agency:  NA  HH Arranged:  RN, Disease Management, [place] Agency:  [place]  Status of Service:  In process, will continue to follow  If discussed at Long Length of Stay Meetings, dates discussed:    Additional Comments:  Midge Minium RN, BSN, NCM-BC, ACM-RN [phone] 06/20/2018, 9:39 AM

## 2018-06-21 LAB — CBC
HCT: 36 % (ref 36.0–46.0)
Hemoglobin: 11.1 g/dL — ABNORMAL LOW (ref 12.0–15.0)
MCH: 26.5 pg (ref 26.0–34.0)
MCHC: 30.8 g/dL (ref 30.0–36.0)
MCV: 85.9 fL (ref 80.0–100.0)
Platelets: 294 10*3/uL (ref 150–400)
RBC: 4.19 MIL/uL (ref 3.87–5.11)
RDW: 14.9 % (ref 11.5–15.5)
WBC: 8.6 10*3/uL (ref 4.0–10.5)
nRBC: 0 % (ref 0.0–0.2)

## 2018-06-21 LAB — BASIC METABOLIC PANEL
ANION GAP: 9 (ref 5–15)
BUN: 27 mg/dL — ABNORMAL HIGH (ref 8–23)
CO2: 24 mmol/L (ref 22–32)
Calcium: 9.8 mg/dL (ref 8.9–10.3)
Chloride: 106 mmol/L (ref 98–111)
Creatinine, Ser: 1.72 mg/dL — ABNORMAL HIGH (ref 0.44–1.00)
GFR calc Af Amer: 36 mL/min — ABNORMAL LOW (ref >60)
GFR calc non Af Amer: 31 mL/min — ABNORMAL LOW (ref >60)
Glucose, Bld: 125 mg/dL — ABNORMAL HIGH (ref 70–99)
Potassium: 4.4 mmol/L (ref 3.5–5.1)
Sodium: 139 mmol/L (ref 135–145)

## 2018-06-21 LAB — GLUCOSE, CAPILLARY
Glucose-Capillary: 126 mg/dL — ABNORMAL HIGH (ref 70–99)
Glucose-Capillary: 120 mg/dL — ABNORMAL HIGH (ref 70–99)

## 2018-06-21 MED ORDER — INSULIN ASPART 100 UNIT/ML ~~LOC~~ SOLN: 3.0000 [IU] | Freq: Three times a day (TID) | SUBCUTANEOUS | Status: DC

## 2018-06-21 MED ORDER — AMIODARONE HCL 400 MG PO TABS: 200.0000 mg | ORAL_TABLET | Freq: Two times a day (BID) | ORAL | 1 refills | Status: DC

## 2018-06-21 MED ORDER — METOPROLOL TARTRATE 50 MG PO TABS: 75.0000 mg | ORAL_TABLET | Freq: Two times a day (BID) | ORAL | 2 refills | Status: DC

## 2018-06-21 MED ORDER — TRAMADOL HCL 50 MG PO TABS: 50.0000 mg | ORAL_TABLET | Freq: Four times a day (QID) | ORAL | 0 refills | Status: AC | PRN

## 2018-06-21 MED ORDER — FENOFIBRATE 160 MG PO TABS: 160.0000 mg | ORAL_TABLET | Freq: Every day | ORAL | 2 refills | Status: DC

## 2018-06-21 MED ORDER — LORATADINE 10 MG PO TABS: 10.0000 mg | ORAL_TABLET | Freq: Every day | ORAL | Status: AC

## 2018-06-21 MED ORDER — ASPIRIN 325 MG PO TBEC: 325.0000 mg | DELAYED_RELEASE_TABLET | Freq: Every day | ORAL | 0 refills | Status: DC

## 2018-06-21 MED ORDER — AMIODARONE HCL 200 MG PO TABS: 200.0000 mg | ORAL_TABLET | Freq: Two times a day (BID) | ORAL | 2 refills | Status: DC

## 2018-06-21 MED ORDER — EZETIMIBE 10 MG PO TABS: 10.0000 mg | ORAL_TABLET | Freq: Every day | ORAL | 2 refills | Status: DC

## 2018-06-21 MED ORDER — METOPROLOL TARTRATE 75 MG PO TABS: 75.0000 mg | ORAL_TABLET | Freq: Two times a day (BID) | ORAL | 2 refills | Status: DC

## 2018-06-21 NOTE — Progress Notes (Signed)
[id number] Education completed with pt who voiced understanding. Reviewed staying in the tube and sternal precautions. Handout given. Encouraged IS. Reviewed walking instructions for ex. Gave low sodium, heart healthy and diabetic diets. Discussed CRP 2 and referred to [facility] program. Pt stated she felt the low sodium would be the diet she would need to work on the most. Luetta Nutting RN BSN 06/21/2018 9:42 AM

## 2018-06-21 NOTE — Progress Notes (Signed)
7 Days Post-Op Procedure(s) (LRB): CORONARY ARTERY BYPASS GRAFTING (CABG) x4  using left internal mammary artery and right greater saphenous vein harvested endoscopically. (N/A) TRANSESOPHAGEAL ECHOCARDIOGRAM (TEE) (N/A) Subjective: No complaints this AM  Objective: Vital signs in last 24 hours: Temp:  [98.2 F (36.8 C)-99.9 F (37.7 C)] 99.2 F (37.3 C) (01/24 0400) Pulse Rate:  [96-101] 96 (01/23 1930) Cardiac Rhythm: Normal sinus rhythm (01/24 0400) Resp:  [13-25] 17 (01/24 0700) BP: (115-142)/(66-98) 135/80 (01/24 0400) SpO2:  [96 %] 96 % (01/24 0400) Weight:  [63.7 kg] 63.7 kg (01/24 0500)  Hemodynamic parameters for last 24 hours:    Intake/Output from previous day: 01/23 0701 - 01/24 0700 In: 1155 [P.O.:1155] Out: 1750 [Urine:1750] Intake/Output this shift: No intake/output data recorded.  General appearance: alert, cooperative and no distress Neurologic: intact Heart: regular rate and rhythm Lungs: clear to auscultation bilaterally Wound: clean and dry, sternum stable  Lab Results: Recent Labs    06/18/18 1938 06/21/18 0235  WBC 10.9* 8.6  HGB 12.2 11.1*  HCT 38.2 36.0  PLT 213 294   BMET:  Recent Labs    06/20/18 0247 06/21/18 0235  NA 139 139  K 4.0 4.4  CL 104 106  CO2 25 24  GLUCOSE 116* 125*  BUN 28* 27*  CREATININE 1.92* 1.72*  CALCIUM 9.3 9.8    PT/INR: No results for input(s): LABPROT, INR in the last 72 hours. ABG    Component Value Date/Time   PHART 7.424 06/18/2018 1928   HCO3 25.6 06/18/2018 1928   TCO2 27 06/18/2018 1928   ACIDBASEDEF 2.0 06/15/2018 0006   O2SAT 95.0 06/18/2018 1928   CBG (last 3)  Recent Labs    06/20/18 1725 06/20/18 2134 06/21/18 0633  GLUCAP 76 144* 126*    Assessment/Plan: S/P Procedure(s) (LRB): CORONARY ARTERY BYPASS GRAFTING (CABG) x4  using left internal mammary artery and right greater saphenous vein harvested endoscopically. (N/A) TRANSESOPHAGEAL ECHOCARDIOGRAM (TEE) (N/A) Plan for  discharge: see discharge orders  Doing well Maintaining SR for 48 hours- continue amiodarone and lopressor Creatinine improved- GFR 36. Will hold off on restarting janumet for now.  Will need a BMET drawn early next week to recheck creatinine   LOS: 9 days    [name] 06/21/2018

## 2018-06-25 ENCOUNTER — Telehealth: Payer: Self-pay

## 2018-06-25 NOTE — Telephone Encounter (Signed)
-----   Message from [name], MD sent at 06/25/2018 10:29 AM EST ----- I would just stop the fenofibrate. If it doesn't resolve after that would have her get seen by primary  [facility] ----- Message ----- From: Steve Rattler, RN Sent: 06/25/2018   9:53 AM EST To: [name], MD  Patient called stating she is vomiting only at night before bed.  No nausea.  Went through her medication list with her and the only new medication that is only taken at night is the fenofibrate.  Interestingly she stated that she has taken cholesterol medication in the past which caused vomiting/ diarrhea.  I advised that you may want to have her discuss this with Norma Fredrickson at her post op appointment next week, but that I would ask you your thoughts.  Thanks,  Morrie Sheldon

## 2018-06-25 NOTE — Telephone Encounter (Signed)
Patient contacted and made aware of medication changes.  Also advised to contact her PCP if the vomiting does not stop.  She acknowledged receipt.

## 2018-06-27 ENCOUNTER — Telehealth: Payer: Self-pay

## 2018-06-27 NOTE — Telephone Encounter (Signed)
Lawson Fiscal, RN with [facility] contacted the office [phone] requesting verbal orders for nursing to see patient for 1 time a week for 4 weeks for post- CABG, HTN, and Diabetes management.  Verbal orders given.  Will await faxed copy for physician signature.

## 2018-07-02 ENCOUNTER — Encounter: Payer: Self-pay | Admitting: Nurse Practitioner

## 2018-07-03 ENCOUNTER — Ambulatory Visit: Payer: Managed Care, Other (non HMO) | Admitting: Nurse Practitioner

## 2018-07-03 ENCOUNTER — Encounter: Payer: Self-pay | Admitting: Nurse Practitioner

## 2018-07-03 VITALS — BP 142/88 | HR 65 | Ht 63.0 in | Wt 139.0 lb

## 2018-07-03 DIAGNOSIS — Z951 Presence of aortocoronary bypass graft: Secondary | ICD-10-CM

## 2018-07-03 MED ORDER — ATORVASTATIN CALCIUM 10 MG PO TABS: 10.0000 mg | ORAL_TABLET | Freq: Every day | ORAL | 6 refills | Status: DC

## 2018-07-03 MED ORDER — AMIODARONE HCL 200 MG PO TABS: 200.0000 mg | ORAL_TABLET | Freq: Every day | ORAL | 2 refills | Status: DC

## 2018-07-03 NOTE — Progress Notes (Signed)
CARDIOLOGY OFFICE NOTE  Date:  07/03/2018    Cassidy CandySusan S Matuszak Date of Birth: 1956-01-06 Medical Record #[id number]  PCP:  Shelbie AmmonsHaque, Imran P, MD  Cardiologist:  [facility] ([place])    Chief Complaint  Patient presents with  . Coronary Artery Disease    Post CABG - seen for Dr. Anne FuSkains    History of Present Illness: Cassidy Green is a 63 y.o. female who presents today for a post hospital visit. Seen for Dr. Anne FuSkains ([place]).   She has a history of DM, HTN, HLD, CKD stage III, GERD, OA, spinal stenosis, remote tobacco use, anemia and anxiety. No prior cardiac history.   Began aving subscapular pain with SOB back in August - sometimes with N/V. These symptoms could come on with exertion. She ended up having a nuclear stress test showing EF of 31% and large reversible defect in the apex and inferior wall. Echo with EF of 50% with inferior basal hypokinesis.   She was referred for left heart catheterization which was carried out on 06/13/2018 and demonstrated severe three-vessel coronary artery disease. TCTS was consulted.  She underwent CABG x 4 per Dr. Dorris FetchHendrickson with [name] to mid LAD, SVG to distal LAD, SVG to 1st DX and OM on 06/14/2017.  Post op course was uneventful. She did have AF - placed on amiodarone. Metoprolol was increased for tachycardia.   Comes in today. Here alone. She feels like she is doing ok - actually wants to do more. She is asking about going on ACE in light of her diabetes. Asking about resuming Voltaren. Asking about taking a shower, doing laundry etc. Using some Ultram. Appetite good. Bowels working. [facility] [place]. She is not able to take Crestor or what sounds like Vascepa. She does not feel like she has ever tried Lipitor. She would like to consider PCSK9 therapy if able.   Past Medical History:  Diagnosis Date  . Anemia   . Anxiety   . Arthritis   . Diabetes mellitus without complication (HCC)    type II  . Female bladder prolapse   .  GERD (gastroesophageal reflux disease)   . Headache   . History of bronchitis   . History of pneumonia   . Hyperlipidemia   . Hypertension   . Joint inflammation     Past Surgical History:  Procedure Laterality Date  . ABDOMINAL HYSTERECTOMY  1992  . COLONOSCOPY    . CORONARY ARTERY BYPASS GRAFT N/A 06/14/2018   Procedure: CORONARY ARTERY BYPASS GRAFTING (CABG) x4  using left internal mammary artery and right greater saphenous vein harvested endoscopically.;  Surgeon: Loreli SlotHendrickson, Steven C, MD;  Location: [facility] OR;  Service: Open Heart Surgery;  Laterality: N/A;  . INCISION AND DRAINAGE HIP Left 09/04/2014   Procedure: IRRIGATION AND DEBRIDEMENT  LEFT HIP WOUND WITH CLOSURE ;  Surgeon: Samson FredericBrian Swinteck, MD;  Location: [facility] OR;  Service: Orthopedics;  Laterality: Left;  . LEFT HEART CATH AND CORONARY ANGIOGRAPHY N/A 06/12/2018   Procedure: LEFT HEART CATH AND CORONARY ANGIOGRAPHY;  Surgeon: Lennette BihariKelly, Thomas A, MD;  Location: [facility] INVASIVE CV LAB;  Service: Cardiovascular;  Laterality: N/A;  . LUMBAR LAMINECTOMY/DECOMPRESSION MICRODISCECTOMY  03/11/2015   Procedure: Lumbar Four-Five DECOMPRESSION  AND REMOVAL SYNOVAL CYST;  Surgeon: Venita Lickahari Brooks, MD;  Location: [facility] OR;  Service: Orthopedics;;  . TEE WITHOUT CARDIOVERSION N/A 06/14/2018   Procedure: TRANSESOPHAGEAL ECHOCARDIOGRAM (TEE);  Surgeon: Loreli SlotHendrickson, Steven C, MD;  [facility];  Service: Open Heart Surgery;  Laterality: N/A;  . TOTAL HIP ARTHROPLASTY Left 07/30/2014   Procedure: LEFT TOTAL HIP ARTHROPLASTY ANTERIOR APPROACH;  Surgeon: Garnet Koyanagi, MD;  Location: [facility] ORS;  Service: Orthopedics;  Laterality: Left;  . TUBAL LIGATION    . ULTRASOUND GUIDANCE FOR VASCULAR ACCESS  06/12/2018   Procedure: Ultrasound Guidance For Vascular Access;  Surgeon: Lennette Bihari, MD;  Location: [facility] INV[facility] CV LAB;  Service: Cardiovascular;;     Medications: Current Meds  Medication Sig  . albuterol (PROVENTIL HFA;VENTOLIN HFA) 108 (90 Base) MCG/ACT  inhaler Inhale 2 puffs into the lungs every 4 (four) hours as needed for wheezing.  Marland Kitchen amiodarone (PACERONE) 200 MG tablet Take 1 tablet (200 mg total) by mouth 2 (two) times daily.  Marland Kitchen aspirin EC 325 MG EC tablet Take 1 tablet (325 mg total) by mouth daily.  Marland Kitchen buPROPion (WELLBUTRIN) 100 MG tablet Take 100 mg by mouth daily.  Marland Kitchen ezetimibe (ZETIA) 10 MG tablet Take 1 tablet (10 mg total) by mouth at bedtime.  . Ferrous Fumarate (HEMOCYTE PO) Take 1 tablet by mouth daily.   . fluticasone (FLONASE) 50 MCG/ACT nasal spray Place 1 spray into both nostrils daily.  . folic acid (FOLVITE) 1 MG tablet Take 1 mg by mouth at bedtime.  Marland Kitchen loratadine (CLARITIN) 10 MG tablet Take 1 tablet (10 mg total) by mouth daily.  . magnesium gluconate (MAGONATE) 500 MG tablet Take 500 mg by mouth daily.  . methocarbamol (ROBAXIN) 500 MG tablet Take 1 tablet (500 mg total) by mouth 3 (three) times daily as needed for muscle spasms.  . metoprolol tartrate (LOPRESSOR) 50 MG tablet Take 1.5 tablets (75 mg total) by mouth 2 (two) times daily.  . Omega-3 Fatty Acids (FISH OIL) 1200 MG CAPS Take 1,200 mg by mouth daily.  Marland Kitchen omeprazole (PRILOSEC) 40 MG capsule Take 40 mg by mouth at bedtime.  . sitaGLIPtin-metformin (JANUMET) 50-1000 MG per tablet Take 1 tablet by mouth at bedtime.  . [DISCONTINUED] fenofibrate 160 MG tablet Take 1 tablet (160 mg total) by mouth daily.     Allergies: Allergies  Allergen Reactions  . Statins Other (See Comments)     " I cant recall the name of the statin, but I had joint pain with one of the statins "    Social History: The patient  reports that she quit smoking about 17 years ago. She has never used smokeless tobacco. She reports current alcohol use. She reports that she does not use drugs.   Family History: The patient's family history is not on file.   Review of Systems: Please see the history of present illness.   Otherwise, the review of systems is positive for none.   All other  systems are reviewed and negative.   Physical Exam: VS:  BP (!) 142/88 (BP Location: Left Arm, Patient Position: Sitting, Cuff Size: Normal)   Pulse 65   Ht 5\' 3"  (1.6 m)   Wt 139 lb (63 kg)   BMI 24.62 kg/m  .  BMI Body mass index is 24.62 kg/m.  Wt Readings from Last 3 Encounters:  07/03/18 139 lb (63 kg)  06/21/18 140 lb 6.9 oz (63.7 kg)  03/11/15 135 lb (61.2 kg)    General: Pleasant. Well developed, well nourished and in no acute distress. She looks really good.   HEENT: Normal.  Neck: Supple, no JVD, carotid bruits, or masses noted.  Cardiac: Regular rate and rhythm. No murmurs, rubs, or gallops. Her  sternum looks good - her chest tube sutures are removed by me. She has very little bruising over the vein harvesting site.  No edema.  Respiratory:  Lungs are clear to auscultation bilaterally with normal work of breathing.  GI: Soft and nontender.  MS: No deformity or atrophy. Gait and ROM intact.  Skin: Warm and dry. Color is normal.  Neuro:  Strength and sensation are intact and no gross focal deficits noted.  Psych: Alert, appropriate and with normal affect.   LABORATORY DATA:  EKG:  EKG is ordered today. This demonstrates NSR with lateral T wave changes.  Lab Results  Component Value Date   WBC 8.6 06/21/2018   HGB 11.1 (L) 06/21/2018   HCT 36.0 06/21/2018   PLT 294 06/21/2018   GLUCOSE 125 (H) 06/21/2018   CHOL 351 (H) 06/12/2018   TRIG 656 (H) 06/12/2018   HDL 27 (L) 06/12/2018   LDLDIRECT 198 (H) 06/12/2018   LDLCALC UNABLE TO CALCULATE IF TRIGLYCERIDE OVER 400 mg/dL 16/02/9603   ALT 18 54/01/8118   AST 23 06/18/2018   NA 139 06/21/2018   K 4.4 06/21/2018   CL 106 06/21/2018   CREATININE 1.72 (H) 06/21/2018   BUN 27 (H) 06/21/2018   CO2 24 06/21/2018   INR 1.29 06/14/2018   HGBA1C 7.6 (H) 06/12/2018     BNP (last 3 results) No results for input(s): BNP in the last 8760 hours.  ProBNP (last 3 results) No results for input(s): PROBNP in the last  8760 hours.   Other Studies Reviewed Today:  CABG PROCEDURE 06/14/2018:  edian sternotomy, extracorporeal circulation coronary artery bypass grafting x4 (left internal mammary artery to mid left anterior descending, saphenous vein graft to distal descending, sequential saphenous vein graft to first diagonal and  obtuse marginal), endoscopic vein harvest right leg, sternal plating with KLS Martin plates.  Significant Diagnostic Studies:  Left Heart CATH  06/12/18  Ost RPDA to RPDA lesion is 99% stenosed.  Mid RCA lesion is 50% stenosed.  Mid RCA to Dist RCA lesion is 80% stenosed.  Dist LM to Prox LAD lesion is 90% stenosed.  Prox LAD lesion is 80% stenosed.  Mid LAD to Dist LAD lesion is 100% stenosed.  Ost Cx to Prox Cx lesion is 70% stenosed.  Severe multivessel CAD with 90% near ostial LAD stenosis followed by 80% stenosis after the first diagonal vessel which had 40% stenoses in the diagonal vessel. The mid LAD is subtotal/totally occluded and there is collateralization from septal perforators supplying the apical portion of the LAD which wraps around the apex. There is also collateralization to the distal RCA via the left coronary injection ; 70% proximal eccentric circumflex stenosis; right coronary artery with diffuse mid 50% narrowing followed by diffuse 80% stenosis beyond the acute margin and 99% PDA stenosis with evidence for both antegrade and retrograde filling of the distal PDA.  Mild LV dysfunction with EF at 50% with focal mild basal inferior hypocontractility. LVEDP 17 mm.  RECOMMENDATION: Surgical consultation for consideration of CABG revascularization. In light of the patient's significant disease, she will be restarted on heparin 8 hours post sheath discontinuance. If patient is intolerant to statins, consider alternative such as Zetia and possible PCSK9 inhibition.  Echo Study Conclusions 05/2018  - Left ventricle: The cavity size was normal. Wall  thickness was normal. Systolic function was normal. The estimated ejection fraction was in the range of 50% to 55%. Moderate hypokinesis of the basalinferior myocardium. Features are consistent with a pseudonormal  left ventricular filling pattern, with concomitant abnormal relaxation and increased filling pressure (grade 2 diastolic dysfunction).   Assessment/Plan:  1. S/P CABG x 4 with [name] to mLAD, SVG to distal LAD, SVG to 1st DX and OM - she is doing great. Reminded about lifting restrictions. Ok to attend rehab. Lab today.   2. CAD - needs CV risk factor modification.   3. HTN - BP is fair - she was on ACE before - will recheck her lab and consider restarting Lisinopril at lower dose   4. HLD - multiple intolerances - wants to try Lipitor - if fails needs to go to Lipid clinic for PCSK9 therapy  5. DM - per PCP  6. CKD - followed by [facility] in [place]. Would not use NSAID. Try to restart Lisinopril. Lab today   Current medicines are reviewed with the patient today.  The patient does not have concerns regarding medicines other than what has been noted above.  The following changes have been made:  See above.  Labs/ tests ordered today include:    Orders Placed This Encounter  Procedures  . EKG 12-Lead     Disposition:   FU with us in 4 to 6 weeks with fasting labs.    Patient is agreeable to this plan and will call if any problems develop in the interim.   SignedNorma Fredrickson: Johnice Riebe, NP  07/03/2018 11:24 AM  [facility] [address] [place], [place]  [address] Phone: [phone]) [phone] Fax: [phone]) [phone]

## 2018-07-03 NOTE — Patient Instructions (Addendum)
We will be checking the following labs today - BMET and CBC    Medication Instructions:    Continue with your current medicines. BUT  Stay off Fenofibrate  I am cutting Amiodarone to just one pill a day - hope to stop on return.   Let's see what your lab show - we may then restart Lisinopril at lower dose  No Diclofenac  I am starting Lipitor 10 mg a day - this is at the pharmacy   If you need a refill on your cardiac medications before your next appointment, please call your pharmacy.     Testing/Procedures To Be Arranged:  N/A  Follow-Up:   See Dr. Anne Fu with fasting labs in 4 to 6 weeks.    At [facility], you and your health needs are our priority.  As part of our continuing mission to provide you with exceptional heart care, we have created designated Provider Care Teams.  These Care Teams include your primary Cardiologist (physician) and Advanced Practice Providers (APPs -  Physician Assistants and Nurse Practitioners) who all work together to provide you with the care you need, when you need it.  Special Instructions:  . If you are not able to take the Lipitor - let us know - we will refer you to the lipid clinic here.   Call the [facility] office at [phone] if you have any questions, problems or concerns.

## 2018-07-04 LAB — CBC
Hematocrit: 35.5 % (ref 34.0–46.6)
Hemoglobin: 11.5 g/dL (ref 11.1–15.9)
MCH: 27.5 pg (ref 26.6–33.0)
MCHC: 32.4 g/dL (ref 31.5–35.7)
MCV: 85 fL (ref 79–97)
Platelets: 483 10*3/uL — ABNORMAL HIGH (ref 150–450)
RBC: 4.18 x10E6/uL (ref 3.77–5.28)
RDW: 15 % (ref 11.7–15.4)
WBC: 8.6 10*3/uL (ref 3.4–10.8)

## 2018-07-04 LAB — BASIC METABOLIC PANEL
BUN/Creatinine Ratio: 13 (ref 12–28)
BUN: 25 mg/dL (ref 8–27)
CO2: 15 mmol/L — ABNORMAL LOW (ref 20–29)
Calcium: 10.2 mg/dL (ref 8.7–10.3)
Chloride: 106 mmol/L (ref 96–106)
Creatinine, Ser: 1.93 mg/dL — ABNORMAL HIGH (ref 0.57–1.00)
GFR calc Af Amer: 31 mL/min/{1.73_m2} — ABNORMAL LOW (ref >59)
GFR calc non Af Amer: 27 mL/min/{1.73_m2} — ABNORMAL LOW (ref >59)
Glucose: 85 mg/dL (ref 65–99)
Potassium: 5.5 mmol/L — ABNORMAL HIGH (ref 3.5–5.2)
Sodium: 140 mmol/L (ref 134–144)

## 2018-07-05 ENCOUNTER — Other Ambulatory Visit: Payer: Self-pay | Admitting: *Deleted

## 2018-07-05 DIAGNOSIS — Z951 Presence of aortocoronary bypass graft: Secondary | ICD-10-CM

## 2018-07-05 DIAGNOSIS — E875 Hyperkalemia: Secondary | ICD-10-CM

## 2018-07-08 ENCOUNTER — Other Ambulatory Visit: Payer: Self-pay | Admitting: *Deleted

## 2018-07-08 ENCOUNTER — Telehealth: Payer: Self-pay | Admitting: Nurse Practitioner

## 2018-07-08 DIAGNOSIS — Z888 Allergy status to other drugs, medicaments and biological substances status: Secondary | ICD-10-CM

## 2018-07-08 NOTE — Telephone Encounter (Signed)
° ° °  Pt c/o medication issue:  1. Name of Medication: atorvastatin (LIPITOR) 10 MG tablet  2. How are you currently taking this medication (dosage and times per day)?  As written 3. Are you having a reaction (difficulty breathing--STAT)? No  4. What is your medication issue? Patient states med is causing nausea

## 2018-07-08 NOTE — Telephone Encounter (Signed)
S/w pt, after taking atorvastatin on Friday with food started vomiting.  Denies fever and chills. Pt only vomited after taking medication on Friday.  Pt called pharmacist and was advised to stop taking medication.  Pt has had problems with statins in the past.  Last ov noted it was suggested pt will have to be referred to lipid clinic for PCSK9 therapy.  Advised pt if this is what Lawson FiscalLori recommends will call pt back to schedule appt. Will update medication list and send to Lawson FiscalLori to advise.

## 2018-07-08 NOTE — Telephone Encounter (Signed)
S/w pt is agreeable to treatment plan.  Will come in on March 28 to the lipid clinic.  Orders in and appt made.

## 2018-07-08 NOTE — Telephone Encounter (Signed)
Noted Let's refer her to the lipid clinic at [facility].

## 2018-07-18 ENCOUNTER — Other Ambulatory Visit: Payer: Managed Care, Other (non HMO) | Admitting: *Deleted

## 2018-07-18 ENCOUNTER — Other Ambulatory Visit: Payer: Self-pay | Admitting: *Deleted

## 2018-07-18 DIAGNOSIS — E875 Hyperkalemia: Secondary | ICD-10-CM

## 2018-07-18 DIAGNOSIS — Z951 Presence of aortocoronary bypass graft: Secondary | ICD-10-CM

## 2018-07-18 LAB — BASIC METABOLIC PANEL
BUN/Creatinine Ratio: 13 (ref 12–28)
BUN: 19 mg/dL (ref 8–27)
CO2: 16 mmol/L — ABNORMAL LOW (ref 20–29)
Calcium: 9.2 mg/dL (ref 8.7–10.3)
Chloride: 105 mmol/L (ref 96–106)
Creatinine, Ser: 1.49 mg/dL — ABNORMAL HIGH (ref 0.57–1.00)
GFR calc Af Amer: 43 mL/min/{1.73_m2} — ABNORMAL LOW (ref >59)
GFR calc non Af Amer: 37 mL/min/{1.73_m2} — ABNORMAL LOW (ref >59)
Glucose: 140 mg/dL — ABNORMAL HIGH (ref 65–99)
Potassium: 5 mmol/L (ref 3.5–5.2)
Sodium: 141 mmol/L (ref 134–144)

## 2018-07-23 ENCOUNTER — Encounter: Payer: Self-pay | Admitting: Thoracic Surgery (Cardiothoracic Vascular Surgery)

## 2018-07-23 ENCOUNTER — Other Ambulatory Visit: Payer: Self-pay

## 2018-07-23 ENCOUNTER — Ambulatory Visit (INDEPENDENT_AMBULATORY_CARE_PROVIDER_SITE_OTHER): Payer: Self-pay | Admitting: Thoracic Surgery (Cardiothoracic Vascular Surgery)

## 2018-07-23 ENCOUNTER — Ambulatory Visit (INDEPENDENT_AMBULATORY_CARE_PROVIDER_SITE_OTHER): Payer: Managed Care, Other (non HMO) | Admitting: Pharmacist

## 2018-07-23 ENCOUNTER — Encounter: Payer: Self-pay | Admitting: Pharmacist

## 2018-07-23 ENCOUNTER — Ambulatory Visit
Admission: RE | Admit: 2018-07-23 | Discharge: 2018-07-23 | Disposition: A | Discharge status: Home / Self Care | Payer: Managed Care, Other (non HMO) | Source: Ambulatory Visit | Attending: Thoracic Surgery (Cardiothoracic Vascular Surgery) | Admitting: Thoracic Surgery (Cardiothoracic Vascular Surgery)

## 2018-07-23 VITALS — BP 118/80 | HR 69 | Resp 16 | Ht 63.0 in | Wt 136.5 lb

## 2018-07-23 DIAGNOSIS — E785 Hyperlipidemia, unspecified: Secondary | ICD-10-CM | POA: Diagnosis not present

## 2018-07-23 DIAGNOSIS — I2510 Atherosclerotic heart disease of native coronary artery without angina pectoris: Secondary | ICD-10-CM

## 2018-07-23 DIAGNOSIS — Z951 Presence of aortocoronary bypass graft: Secondary | ICD-10-CM

## 2018-07-23 DIAGNOSIS — Z736 Limitation of activities due to disability: Secondary | ICD-10-CM

## 2018-07-23 MED ORDER — TRAMADOL HCL 50 MG PO TABS: 50.0000 mg | ORAL_TABLET | Freq: Four times a day (QID) | ORAL | 0 refills | Status: DC | PRN

## 2018-07-23 NOTE — Progress Notes (Signed)
Patient ID: LOUBERTHA BELD                 DOB: [birth year]                    MRN: [id number]     HPI: Cassidy Green is a 63 y.o. female patient of Dr. Anne Fu that presents today for lipid evaluation.  PMH includes DM, HTN, HLD, CKD stage III, GERD, OA, spinal stenosis, remote tobacco use, anemia and anxiety. She is s/p recent CABG after cardiac cath which revealed severe three-vessel disease. She has been unable to tolerate statin therapy due to joint pain.   She presents today for discussion of cholesterol. She states that she has tried several statins with varying side effects. She is not interested in taking a statin medication due to her previous side effects (see below).   Risk Factors: CAD s/p CABG LDL Goal: <70  Current Medications: ezetimibe 10mg  daily, fish oil 1200mg  daily Intolerances: atorvastatin 40mg  and 10mg  daily (joint aches, sick on stomach), fenofibrate (sick stomach), rosuvastatin daily (joint pain), vascepa (diarrhea)  Diet: Eats out and from home. She eats mostly [place], chinese, steaks, [employer] when eats out. She eats salmon, fish, chicken. She occasionally eats steak. Does eat vegetables regularly. She uses an Soil scientist and grilled mostly. She uses olive oil. She drinks tea 1/2 and 1/2, water, and lemonade. Rarely has coke (about 1 time per week). She does drink diet sprite and ginerale.   Exercise: She walks some [place] before surgery. She has been exercising with PT and is getting ready to start cardiac rehab.   Family History: Dad and mom with high cholesterol. Dad with open heart surgery and defib. All sibling with high blood pressure and one with high cholesterol.   Social History: quit smoking 17 years ago. Denies alcohol use.   Labs: 06/12/18:  TC 351, TG 656, HDL 27, LDL-d 198 (ezetimibe 10mg  daily) 03/25/18:  TC 276, TG 516, LDL (TG too high to calc) 33/35/45:  TC 311, TG 316, LDL 210 (no therapy)  Past Medical History:  Diagnosis Date  . Anemia     . Anxiety   . Arthritis   . Diabetes mellitus without complication (HCC)    type II  . Female bladder prolapse   . GERD (gastroesophageal reflux disease)   . Headache   . History of bronchitis   . History of pneumonia   . Hyperlipidemia   . Hypertension   . Joint inflammation     Current Outpatient Medications on File Prior to Visit  Medication Sig Dispense Refill  . albuterol (PROVENTIL HFA;VENTOLIN HFA) 108 (90 Base) MCG/ACT inhaler Inhale 2 puffs into the lungs every 4 (four) hours as needed for wheezing.    Marland Kitchen amiodarone (PACERONE) 200 MG tablet Take 1 tablet (200 mg total) by mouth daily. 60 tablet 2  . aspirin EC 325 MG EC tablet Take 1 tablet (325 mg total) by mouth daily. 30 tablet 0  . buPROPion (WELLBUTRIN) 100 MG tablet Take 100 mg by mouth daily.    Marland Kitchen ezetimibe (ZETIA) 10 MG tablet Take 1 tablet (10 mg total) by mouth at bedtime. 30 tablet 2  . Ferrous Fumarate (HEMOCYTE PO) Take 1 tablet by mouth daily.     . fluticasone (FLONASE) 50 MCG/ACT nasal spray Place 1 spray into both nostrils daily.    . folic acid (FOLVITE) 1 MG tablet Take 1 mg by mouth at bedtime.    Marland Kitchen  loratadine (CLARITIN) 10 MG tablet Take 1 tablet (10 mg total) by mouth daily.    . magnesium gluconate (MAGONATE) 500 MG tablet Take 500 mg by mouth daily.    . methocarbamol (ROBAXIN) 500 MG tablet Take 1 tablet (500 mg total) by mouth 3 (three) times daily as needed for muscle spasms. 60 tablet 0  . metoprolol tartrate (LOPRESSOR) 50 MG tablet Take 1.5 tablets (75 mg total) by mouth 2 (two) times daily. 90 tablet 2  . Omega-3 Fatty Acids (FISH OIL) 1200 MG CAPS Take 1,200 mg by mouth daily.    Marland Kitchen omeprazole (PRILOSEC) 40 MG capsule Take 40 mg by mouth at bedtime.    . sitaGLIPtin-metformin (JANUMET) 50-1000 MG per tablet Take 1 tablet by mouth at bedtime.     Current Facility-Administered Medications on File Prior to Visit  Medication Dose Route Frequency Provider Last Rate Last Dose  . dexamethasone  (DECADRON) injection 4 mg  4 mg Intravenous Once Samson Frederic, MD      . ondansetron (ZOFRAN) 4 mg in sodium chloride 0.9 % 50 mL IVPB  4 mg Intravenous Once Samson Frederic, MD        Allergies  Allergen Reactions  . Statins Other (See Comments)     " I cant recall the name of the statin, but I had joint pain with one of the statins "    Assessment/Plan: Hyperlipidemia: LDL and TG are not at goal. Discussed options including retrial of statin and PCSK9i therapy. She does not wish to rechallenge with a statin due to side effects. She would like to pursue PCSK9i therapy. Discussed risk vs. Benefit, injection technique, and cost obligations. She should qualify for copay card given commercial insurance. Will send for coverage of PCSK9i therapy through insurance. Discussed diet extensively as it relates to TG management. She is meeting with a dietician in the coming weeks to assist with this since she has been intolerant to therapies that treat elevated TG. Will schedule f/u labs once started PCSK9i therapy.    Thank you,  Freddie Apley. Cleatis Polka, PharmD  [facility]  07/23/2018 8:07 AM

## 2018-07-23 NOTE — Patient Instructions (Addendum)
We will send for coverage of Repatha. We will call you once it is approved.   Please call us with any questions or concerns [phone].   Cholesterol Cholesterol is a white, waxy, fat-like substance that is needed by the human body in small amounts. The liver makes all the cholesterol we need. Cholesterol is carried from the liver by the blood through the blood vessels. Deposits of cholesterol (plaques) may build up on blood vessel (artery) walls. Plaques make the arteries narrower and stiffer. Cholesterol plaques increase the risk for heart attack and stroke. You cannot feel your cholesterol level even if it is very high. The only way to know that it is high is to have a blood test. Once you know your cholesterol levels, you should keep a record of the test results. Work with your health care provider to keep your levels in the desired range. What do the results mean?  Total cholesterol is a rough measure of all the cholesterol in your blood.  LDL (low-density lipoprotein) is the "bad" cholesterol. This is the type that causes plaque to build up on the artery walls. You want this level to be low.  HDL (high-density lipoprotein) is the "good" cholesterol because it cleans the arteries and carries the LDL away. You want this level to be high.  Triglycerides are fat that the body can either burn for energy or store. High levels are closely linked to heart disease. What are the desired levels of cholesterol?  Total cholesterol below 200.  LDL below 100 for people who are at risk, below 70 for people at very high risk.  HDL above 40 is good. A level of 60 or higher is considered to be protective against heart disease.  Triglycerides below 150. How can I lower my cholesterol? Diet Follow your diet program as told by your health care provider.  Choose fish or white meat chicken and [place], roasted or baked. Limit fatty cuts of red meat, fried foods, and processed meats, such as sausage and  lunch meats.  Eat lots of fresh fruits and vegetables.  Choose whole grains, beans, pasta, potatoes, and cereals.  Choose olive oil, corn oil, or canola oil, and use only small amounts.  Avoid butter, mayonnaise, shortening, or palm kernel oils.  Avoid foods with trans fats.  Drink skim or nonfat milk and eat low-fat or nonfat yogurt and cheeses. Avoid whole milk, cream, ice cream, egg yolks, and full-fat cheeses.  Healthier desserts include angel food cake, ginger snaps, animal crackers, hard candy, popsicles, and low-fat or nonfat frozen yogurt. Avoid pastries, cakes, pies, and cookies.  Exercise  Follow your exercise program as told by your health care provider. A regular program: ? Helps to decrease LDL and raise HDL. ? Helps with weight control.  Do things that increase your activity level, such as gardening, walking, and taking the stairs.  Ask your health care provider about ways that you can be more active in your daily life. Medicine  Take over-the-counter and prescription medicines only as told by your health care provider. ? Medicine may be prescribed by your health care provider to help lower cholesterol and decrease the risk for heart disease. This is usually done if diet and exercise have failed to bring down cholesterol levels. ? If you have several risk factors, you may need medicine even if your levels are normal. This information is not intended to replace advice given to you by your health care provider. Make sure you discuss any questions  you have with your health care provider. Document Released: 02/07/2001 Document Revised: 12/11/2015 Document Reviewed: 11/13/2015 Elsevier Interactive Patient Education  2019 [employer].    Cholesterol Content in Foods Cholesterol is a waxy, fat-like substance that helps to carry fat in the blood. The body needs cholesterol in small amounts, but too much cholesterol can cause damage to the arteries and heart. Most people  should eat less than 200 milligrams (mg) of cholesterol a day. Foods with cholesterol  Cholesterol is found in animal-based foods, such as meat, seafood, and dairy. Generally, low-fat dairy and lean meats have less cholesterol than full-fat dairy and fatty meats. The milligrams of cholesterol per serving (mg per serving) of common cholesterol-containing foods are listed below. Meat and other proteins  Egg - one large whole egg has 186 mg.  Veal shank - 4 oz has 141 mg.  Lean ground [place] (93% lean) - 4 oz has 118 mg.  Fat-trimmed lamb loin - 4 oz has 106 mg.  Lean ground beef (90% lean) - 4 oz has 100 mg.  Lobster - 3.5 oz has 90 mg.  Pork loin chops - 4 oz has 86 mg.  Canned salmon - 3.5 oz has 83 mg.  Fat-trimmed beef top loin - 4 oz has 78 mg.  Frankfurter - 1 frank (3.5 oz) has 77 mg.  Crab - 3.5 oz has 71 mg.  Roasted chicken without skin, white meat - 4 oz has 66 mg.  Light bologna - 2 oz has 45 mg.  Deli-cut [place] - 2 oz has 31 mg.  Canned tuna - 3.5 oz has 31 mg.  Bacon - 1 oz has 29 mg.  Oysters and mussels (raw) - 3.5 oz has 25 mg.  Mackerel - 1 oz has 22 mg.  Trout - 1 oz has 20 mg.  Pork sausage - 1 link (1 oz) has 17 mg.  Salmon - 1 oz has 16 mg.  Tilapia - 1 oz has 14 mg. Dairy  Soft-serve ice cream -  cup (4 oz) has 103 mg.  Whole-milk yogurt - 1 cup (8 oz) has 29 mg.  Cheddar cheese - 1 oz has 28 mg.  American cheese - 1 oz has 28 mg.  Whole milk - 1 cup (8 oz) has 23 mg.  2% milk - 1 cup (8 oz) has 18 mg.  Cream cheese - 1 tablespoon (Tbsp) has 15 mg.  Cottage cheese -  cup (4 oz) has 14 mg.  Low-fat (1%) milk - 1 cup (8 oz) has 10 mg.  Sour cream - 1 Tbsp has 8.5 mg.  Low-fat yogurt - 1 cup (8 oz) has 8 mg.  Nonfat Greek yogurt - 1 cup (8 oz) has 7 mg.  Half-and-half cream - 1 Tbsp has 5 mg. Fats and oils  Cod liver oil - 1 tablespoon (Tbsp) has 82 mg.  Butter - 1 Tbsp has 15 mg.  Lard - 1 Tbsp has 14 mg.  Bacon  grease - 1 Tbsp has 14 mg.  Mayonnaise - 1 Tbsp has 5-10 mg.  Margarine - 1 Tbsp has 3-10 mg. Exact amounts of cholesterol in these foods may vary depending on specific ingredients and brands. Foods without cholesterol Most plant-based foods do not have cholesterol unless you combine them with a food that has cholesterol. Foods without cholesterol include:  Grains and cereals.  Vegetables.  Fruits.  Vegetable oils, such as olive, canola, and sunflower oil.  Legumes, such as peas, beans, and lentils.  Nuts and seeds.  Egg whites. Summary  The body needs cholesterol in small amounts, but too much cholesterol can cause damage to the arteries and heart.  Most people should eat less than 200 milligrams (mg) of cholesterol a day. This information is not intended to replace advice given to you by your health care provider. Make sure you discuss any questions you have with your health care provider. Document Released: 01/09/2017 Document Revised: 01/09/2017 Document Reviewed: 01/09/2017 Elsevier Interactive Patient Education  [employer].

## 2018-07-23 NOTE — Progress Notes (Signed)
[address]       [name] [address]             [phone]       HPI: Cassidy Green returns for scheduled postoperative follow-up visit  Cassidy Green is a 63 year old woman with a history of type 2 diabetes, hypertension, hyperlipidemia, stage III chronic kidney disease, arthritis, spinal stenosis, remote tobacco abuse, anemia, and anxiety.  She presented with subscapular pain.  Work-up included a stress test which showed a large reversible defect in the apex and inferior wall and an ejection fraction of 31%.  She underwent cardiac catheterization which revealed severe three-vessel coronary disease.  I did coronary bypass grafting x4 on 06/14/2018.  Postoperatively she had atrial fibrillation.  She converted to sinus rhythm on amiodarone.  She did not require anticoagulation.  She still has some incisional pain.  She mostly takes Tylenol for that, but is still using tramadol occasionally.  She does not use it every day but did have to use it yesterday.  She has noticed that it tends to hurt more on rainy days.  She has not had any recurrent anginal pain.  She still has some nausea even after her amiodarone was decreased to once daily. Past Medical History:  Diagnosis Date  . Anemia   . Anxiety   . Arthritis   . Diabetes mellitus without complication (HCC)    type II  . Female bladder prolapse   . GERD (gastroesophageal reflux disease)   . Headache   . History of bronchitis   . History of pneumonia   . Hyperlipidemia   . Hypertension   . Joint inflammation     Current Outpatient Medications  Medication Sig Dispense Refill  . albuterol (PROVENTIL HFA;VENTOLIN HFA) 108 (90 Base) MCG/ACT inhaler Inhale 2 puffs into the lungs every 4 (four) hours as needed for wheezing.    Marland Kitchen aspirin EC 325 MG EC tablet Take 1 tablet (325 mg total) by mouth daily. 30 tablet 0  . buPROPion (WELLBUTRIN) 100 MG tablet Take 100 mg by mouth daily.    Marland Kitchen ezetimibe (ZETIA) 10 MG tablet Take  1 tablet (10 mg total) by mouth at bedtime. 30 tablet 2  . Ferrous Fumarate (HEMOCYTE PO) Take 1 tablet by mouth daily.     . fluticasone (FLONASE) 50 MCG/ACT nasal spray Place 1 spray into both nostrils daily.    . folic acid (FOLVITE) 1 MG tablet Take 1 mg by mouth at bedtime.    Marland Kitchen loratadine (CLARITIN) 10 MG tablet Take 1 tablet (10 mg total) by mouth daily.    . magnesium gluconate (MAGONATE) 500 MG tablet Take 500 mg by mouth daily.    . metoprolol tartrate (LOPRESSOR) 50 MG tablet Take 1.5 tablets (75 mg total) by mouth 2 (two) times daily. 90 tablet 2  . Omega-3 Fatty Acids (FISH OIL) 1200 MG CAPS Take 1,200 mg by mouth daily.    Marland Kitchen omeprazole (PRILOSEC) 40 MG capsule Take 40 mg by mouth at bedtime.    . sitaGLIPtin-metformin (JANUMET) 50-1000 MG per tablet Take 1 tablet by mouth at bedtime.    . traMADol (ULTRAM) 50 MG tablet Take 1 tablet (50 mg total) by mouth every 6 (six) hours as needed. 20 tablet 0   No current facility-administered medications for this visit.    Facility-Administered Medications Ordered in Other Visits  Medication Dose Route Frequency Provider Last Rate Last Dose  . dexamethasone (DECADRON) injection 4 mg  4 mg Intravenous Once Samson Frederic, MD      . ondansetron [facility]) 4 mg in sodium chloride 0.9 % 50 mL IVPB  4 mg Intravenous Once Samson Frederic, MD        Physical Exam BP 118/80 (BP Location: Right Arm, Patient Position: Sitting, Cuff Size: Large) Comment (Cuff Size): MANUALLY  Pulse 69   Resp 16   Ht 5\' 3"  (1.6 m)   Wt 136 lb 8 oz (61.9 kg)   SpO2 97% Comment: ON RA  BMI 24.42 kg/m  64 year old woman in no acute distress Alert and oriented x3 with no focal deficits Sternum stable, incision healing well Cardiac regular rate and rhythm Lungs clear with equal breath sounds bilaterally Leg incisions healing well, no peripheral edema  Diagnostic Tests: CHEST - 2 VIEW  COMPARISON:  06/20/2018.  FINDINGS: Mediastinum and hilar structures  normal. Mild left base subsegmental atelectasis. Prior CABG. Heart size normal. No acute bony abnormality.  IMPRESSION: 1.  Mild left base subsegmental atelectasis.  2.  Prior CABG.  Heart size normal.   Electronically Signed   By: Maisie Fus  Register   On: 07/23/2018 12:23 I personally reviewed the chest x-ray images and concur with the findings noted above  Impression: Cassidy Green is a 63 year old woman with a history of type 2 diabetes, hypertension, hyperlipidemia, and stage III chronic kidney disease.  She has remote history of tobacco abuse but quit 18 years ago.  She presented with subscapular pain and had a positive stress test.  At catheterization she had three-vessel disease.  She underwent coronary bypass grafting x4 on 06/14/2018.  She is recovering well at this point she does still have some incisional pain.  She is almost out of the tramadol that she was given when she left the hospital.  I am going to give her an additional 20 tablets of tramadol 50 mg to take 1 every 6 hours as needed for pain.  No refills.  She should not lift anything over 10 pounds for another week.  Beyond that her activities are unrestricted.  She will start cardiac rehab next week.  She may begin driving.  Appropriate precautions were discussed.  Nausea-she continues to have nausea.  Has improved but not resolved since her amiodarone was decreased.  She is almost 6 weeks out from surgery so I think we can safely discontinue that at this point.  Hopefully that will help the nausea.  Plan: Discontinue amiodarone Follow-up as scheduled with Dr. Anne Fu. I will be happy to see her back anytime in the future if I can be of any further assistance with her care  [name], MD [facility] [phone]

## 2018-07-24 ENCOUNTER — Telehealth: Payer: Self-pay | Admitting: Pharmacist

## 2018-07-24 MED ORDER — EVOLOCUMAB 140 MG/ML ~~LOC~~ SOAJ: 140.0000 mg | SUBCUTANEOUS | 11 refills | Status: DC

## 2018-07-24 NOTE — Telephone Encounter (Signed)
Per insurance Repatha available without authorization.   RX has been sent to pharmacy as requested with copay card attached. Copay card placed in mail to patient as per conversation.   Spoke with patient and she is aware of above. Will call once starts medication.

## 2018-07-26 ENCOUNTER — Ambulatory Visit: Payer: Managed Care, Other (non HMO)

## 2018-08-15 ENCOUNTER — Telehealth: Payer: Self-pay | Admitting: Cardiology

## 2018-08-15 NOTE — Telephone Encounter (Signed)
Spoke to Ms. Cassidy Green who has an upcoming appointment on Monday, 08/19/2018 with Gaelyn Tukes.  She is doing very well no anginal symptoms no fevers chills nausea vomiting syncope bleeding.  She has an ample supply of her medications.  She has just started cardiac rehab.  Had some questions about whether or not she needs to stay on her diabetic medication or minimize the dosage.  Asked her to discuss this further with her primary doctor.  She understands that she can contact us if she has any worrisome medical symptoms.  In light of the coronavirus, she is okay with postponing her appointment.  I think it is reasonable for her to be seen 12 weeks or greater.  Donato Schultz, MD

## 2018-08-19 ENCOUNTER — Ambulatory Visit: Payer: Managed Care, Other (non HMO) | Admitting: Cardiology

## 2018-08-22 ENCOUNTER — Telehealth: Payer: Self-pay | Admitting: Cardiology

## 2018-08-22 NOTE — Telephone Encounter (Signed)
New Message:    Pt's Repatha needs a prior authorization.

## 2018-08-22 NOTE — Telephone Encounter (Signed)
Will follow up 

## 2018-08-22 NOTE — Telephone Encounter (Signed)
Prior auth for REPATHA needed. Please address thank you.

## 2018-08-23 ENCOUNTER — Telehealth: Payer: Self-pay | Admitting: Pharmacist

## 2018-08-23 NOTE — Telephone Encounter (Signed)
Attempted to call pt to let her know that PA for Repatha was approved. But unable to leave VM as it asks for "access code"  PA approved through 02/18/2019

## 2018-08-26 NOTE — Telephone Encounter (Signed)
Patient made aware the PA was approved through 02/18/2019

## 2018-08-27 NOTE — Telephone Encounter (Signed)
Please arrange a OV in June unless she would like telehealth visit with Dr. Anne Fu earlier.

## 2018-09-13 ENCOUNTER — Other Ambulatory Visit: Payer: Self-pay | Admitting: Cardiology

## 2018-09-13 ENCOUNTER — Other Ambulatory Visit: Payer: Self-pay | Admitting: Physician Assistant

## 2018-09-13 MED ORDER — EZETIMIBE 10 MG PO TABS: 10.0000 mg | ORAL_TABLET | Freq: Every day | ORAL | 3 refills | Status: DC

## 2018-09-20 ENCOUNTER — Other Ambulatory Visit: Payer: Self-pay | Admitting: Physician Assistant

## 2018-09-25 ENCOUNTER — Other Ambulatory Visit: Payer: Self-pay | Admitting: Cardiology

## 2018-09-25 MED ORDER — METOPROLOL TARTRATE 50 MG PO TABS: 75.0000 mg | ORAL_TABLET | Freq: Two times a day (BID) | ORAL | 3 refills | Status: DC

## 2018-10-08 ENCOUNTER — Encounter: Payer: Self-pay | Admitting: Cardiology

## 2018-10-08 ENCOUNTER — Ambulatory Visit: Payer: Managed Care, Other (non HMO) | Admitting: Cardiology

## 2018-10-08 ENCOUNTER — Other Ambulatory Visit: Payer: Self-pay

## 2018-10-08 VITALS — BP 148/80 | HR 80 | Ht 63.0 in | Wt 142.6 lb

## 2018-10-08 DIAGNOSIS — E785 Hyperlipidemia, unspecified: Secondary | ICD-10-CM | POA: Diagnosis not present

## 2018-10-08 DIAGNOSIS — Z951 Presence of aortocoronary bypass graft: Secondary | ICD-10-CM | POA: Diagnosis not present

## 2018-10-08 DIAGNOSIS — Z79899 Other long term (current) drug therapy: Secondary | ICD-10-CM

## 2018-10-08 MED ORDER — ASPIRIN 81 MG PO TBEC: 81.0000 mg | DELAYED_RELEASE_TABLET | Freq: Every day | ORAL | Status: DC

## 2018-10-08 NOTE — Patient Instructions (Signed)
Medication Instructions:  Please decrease your Aspirin to 81 mg daily. Continue all other medications as listed. You may use Coricidin HBP for cold signs/symptoms if necessary.  If you need a refill on your cardiac medications before your next appointment, please call your pharmacy.   Lab work: Please have Lipid/CMP today. If you have labs (blood work) drawn today and your tests are completely normal, you will receive your results only by: Marland Kitchen MyChart Message (if you have MyChart) OR . A paper copy in the mail If you have any lab test that is abnormal or we need to change your treatment, we will call you to review the results.  Follow-Up: At [facility], you and your health needs are our priority.  As part of our continuing mission to provide you with exceptional heart care, we have created designated Provider Care Teams.  These Care Teams include your primary Cardiologist (physician) and Advanced Practice Providers (APPs -  Physician Assistants and Nurse Practitioners) who all work together to provide you with the care you need, when you need it. You will need a follow up appointment in 6 months with Norma Fredrickson, NP and 1 year with Dr Anne Fu.  Please call our office 2 months in advance to schedule this appointment.  You may see Donato Schultz, MD or one of the following Advanced Practice Providers on your designated Care Team:   Norma Fredrickson, NP Nada Boozer, NP . Georgie Chard, NP  Thank you for choosing [facility]!!

## 2018-10-08 NOTE — Progress Notes (Signed)
Cardiology Office Note:    Date:  10/08/2018   ID:  Cassidy CandySusan S Thedford, DOB 08-Jun-1955, MRN [id number]  PCP:  Shelbie AmmonsHaque, Imran P, MD  Cardiologist:  Donato SchultzMark Shereese Bonnie, MD  Electrophysiologist:  None   Referring MD: Shelbie AmmonsHaque, Imran P, MD   No chief complaint on file. CABG/coronary artery disease/hyperlipidemia follow-up  History of Present Illness:    Cassidy Green is a 63 y.o. female CAD with CABG, DM HTN.  Has been seen in the pharmacy for Repatha.  Intolerances: atorvastatin 40mg  and 10mg  daily (joint aches, sick on stomach), fenofibrate (sick stomach), rosuvastatin daily (joint pain), vascepa (diarrhea)  She seems to be doing quite well on the Repatha.  She did have some questions about taking decongestants, asked her to avoid anything with Sudafed base in it.  Overall she is feeling quite well.  Medications reviewed.    Past Medical History:  Diagnosis Date  . Anemia   . Anxiety   . Arthritis   . Diabetes mellitus without complication (HCC)    type II  . Female bladder prolapse   . GERD (gastroesophageal reflux disease)   . Headache   . History of bronchitis   . History of pneumonia   . Hyperlipidemia   . Hypertension   . Joint inflammation     Past Surgical History:  Procedure Laterality Date  . ABDOMINAL HYSTERECTOMY  1992  . COLONOSCOPY    . CORONARY ARTERY BYPASS GRAFT N/A 06/14/2018   Procedure: CORONARY ARTERY BYPASS GRAFTING (CABG) x4  using left internal mammary artery and right greater saphenous vein harvested endoscopically.;  Surgeon: Loreli SlotHendrickson, Steven C, MD;  Location: [facility] OR;  Service: Open Heart Surgery;  Laterality: N/A;  . INCISION AND DRAINAGE HIP Left 09/04/2014   Procedure: IRRIGATION AND DEBRIDEMENT  LEFT HIP WOUND WITH CLOSURE ;  Surgeon: Samson FredericBrian Swinteck, MD;  Location: [facility] OR;  Service: Orthopedics;  Laterality: Left;  . LEFT HEART CATH AND CORONARY ANGIOGRAPHY N/A 06/12/2018   Procedure: LEFT HEART CATH AND CORONARY ANGIOGRAPHY;  Surgeon: Lennette BihariKelly, Thomas A, MD;   Location: [facility] INVASIVE CV LAB;  Service: Cardiovascular;  Laterality: N/A;  . LUMBAR LAMINECTOMY/DECOMPRESSION MICRODISCECTOMY  03/11/2015   Procedure: Lumbar Four-Five DECOMPRESSION  AND REMOVAL SYNOVAL CYST;  Surgeon: Venita Lickahari Brooks, MD;  Location: [facility] OR;  Service: Orthopedics;;  . TEE WITHOUT CARDIOVERSION N/A 06/14/2018   Procedure: TRANSESOPHAGEAL ECHOCARDIOGRAM (TEE);  Surgeon: Loreli SlotHendrickson, Steven C, MD;  [facility];  Service: Open Heart Surgery;  Laterality: N/A;  . TOTAL HIP ARTHROPLASTY Left 07/30/2014   Procedure: LEFT TOTAL HIP ARTHROPLASTY ANTERIOR APPROACH;  Surgeon: Garnet KoyanagiBrian James Swinteck, MD;  Location: [facility] ORS;  Service: Orthopedics;  Laterality: Left;  . TUBAL LIGATION    . ULTRASOUND GUIDANCE FOR VASCULAR ACCESS  06/12/2018   Procedure: Ultrasound Guidance For Vascular Access;  Surgeon: Lennette BihariKelly, Thomas A, MD;  [facility];  Service: Cardiovascular;;    Current Medications: Current Meds  Medication Sig  . albuterol (PROVENTIL HFA;VENTOLIN HFA) 108 (90 Base) MCG/ACT inhaler Inhale 2 puffs into the lungs every 4 (four) hours as needed for wheezing.  Marland Kitchen. aspirin 81 MG EC tablet Take 1 tablet (81 mg total) by mouth daily.  Marland Kitchen. buPROPion (WELLBUTRIN) 100 MG tablet Take 100 mg by mouth daily as needed.   . Evolocumab (REPATHA SURECLICK) 140 MG/ML SOAJ Inject 140 mg into the skin every 14 (fourteen) days.  Marland Kitchen. ezetimibe (ZETIA) 10 MG tablet Take 1 tablet (10 mg total) by mouth at bedtime.  .Marland Kitchen  Ferrous Fumarate (HEMOCYTE PO) Take 1 tablet by mouth daily.   . fluticasone (FLONASE) 50 MCG/ACT nasal spray Place 1 spray into both nostrils daily.  . folic acid (FOLVITE) 1 MG tablet Take 1 mg by mouth at bedtime.  Marland Kitchen loratadine (CLARITIN) 10 MG tablet Take 1 tablet (10 mg total) by mouth daily.  . metoprolol tartrate (LOPRESSOR) 50 MG tablet Take 1.5 tablets (75 mg total) by mouth 2 (two) times daily.  . Omega-3 Fatty Acids (FISH OIL) 1200 MG CAPS Take 1,200 mg by mouth daily.  Marland Kitchen  omeprazole (PRILOSEC) 40 MG capsule Take 40 mg by mouth at bedtime.  . sitaGLIPtin-metformin (JANUMET) 50-1000 MG per tablet Take 1 tablet by mouth at bedtime.  . sodium bicarbonate 650 MG tablet Take 650 mg by mouth 2 (two) times daily.  . traMADol (ULTRAM) 50 MG tablet Take 1 tablet (50 mg total) by mouth every 6 (six) hours as needed.  . [DISCONTINUED] aspirin EC 325 MG EC tablet Take 1 tablet (325 mg total) by mouth daily.     Allergies:   Statins   Social History   Socioeconomic History  . Marital status: Married    Spouse name: Not on file  . Number of children: Not on file  . Years of education: Not on file  . Highest education level: Not on file  Occupational History  . Not on file  Social Needs  . Financial resource strain: Not on file  . Food insecurity:    Worry: Not on file    Inability: Not on file  . Transportation needs:    Medical: Not on file    Non-medical: Not on file  Tobacco Use  . Smoking status: Former Smoker    Last attempt to quit: 02/26/2001    Years since quitting: 17.6  . Smokeless tobacco: Never Used  Substance and Sexual Activity  . Alcohol use: Yes    Comment: OCCASIONAL  . Drug use: No  . Sexual activity: Not on file  Lifestyle  . Physical activity:    Days per week: Not on file    Minutes per session: Not on file  . Stress: Not on file  Relationships  . Social connections:    Talks on phone: Not on file    Gets together: Not on file    Attends religious service: Not on file    Active member of club or organization: Not on file    Attends meetings of clubs or organizations: Not on file    Relationship status: Not on file  Other Topics Concern  . Not on file  Social History Narrative  . Not on file     Family History: The patient's family history is not on file.  No early family history of CAD  ROS:   Please see the history of present illness.    Denies any fevers chills nausea vomiting syncope bleeding all other systems  reviewed and are negative.  EKGs/Labs/Other Studies Reviewed:    The following studies were reviewed today:  Cardiac catheterization 06/12/2018-multivessel CAD.  EKG:  EKG is not ordered today.  Prior EKG with T wave inversion in lateral leads 07/03/2018.  Personally reviewed.  Recent Labs: 06/15/2018: Magnesium 2.1 06/18/2018: ALT 18 07/03/2018: Hemoglobin 11.5; Platelets 483 07/18/2018: BUN 19; Creatinine, Ser 1.49; Potassium 5.0; Sodium 141  Recent Lipid Panel    Component Value Date/Time   CHOL 351 (H) 06/12/2018 0443   TRIG 656 (H) 06/12/2018 0443   HDL 27 (L) 06/12/2018  0443   CHOLHDL 13.0 06/12/2018 0443   VLDL UNABLE TO CALCULATE IF TRIGLYCERIDE OVER 400 mg/dL 16/02/9603 5409   LDLCALC UNABLE TO CALCULATE IF TRIGLYCERIDE OVER 400 mg/dL 81/19/1478 2956   LDLDIRECT 198 (H) 06/12/2018 0443    Physical Exam:    VS:  BP (!) 148/80   Pulse 80   Ht  (1.6 m)   Wt 142 lb 9.6 oz (64.7 kg)   SpO2 98%   BMI 25.26 kg/m     Wt Readings from Last 3 Encounters:  10/08/18 142 lb 9.6 oz (64.7 kg)  07/23/18 136 lb 8 oz (61.9 kg)  07/03/18 139 lb (63 kg)     GEN:  Well nourished, well developed in no acute distress HEENT: Normal NECK: No JVD; No carotid bruits LYMPHATICS: No lymphadenopathy CARDIAC: CABG scar noted, RRR, no murmurs, rubs, gallops RESPIRATORY:  Clear to auscultation without rales, wheezing or rhonchi  ABDOMEN: Soft, non-tender, non-distended MUSCULOSKELETAL:  No edema; No deformity  SKIN: Warm and dry NEUROLOGIC:  Alert and oriented x 3 PSYCHIATRIC:  Normal affect   ASSESSMENT:    1. Dyslipidemia (high LDL; low HDL)   2. Long-term use of high-risk medication   3. S/P CABG (coronary artery bypass graft)    PLAN:    In order of problems listed above:  Statin intolerance/mixed hyperlipidemia - Repatha doing well.  We will check lipid panel and liver function.  Continue with fish oil and Zetia.  Appreciate pharmacy's assistance.  Chronic kidney disease  stage III -I will check a basic metabolic profile.  Creatinine has ranged from 1.49-1.92.  Avoid NSAIDs.  Diabetes with hypertension - Medications reviewed.  Try to avoid decongestants like Claritin-D.  I gave her the suggestion of Coricidin HBP or straight Claritin.  Try to avoid Norel, has phenylephrine in it.  Coronary artery disease status post CABG -Doing well, some sensitivity at her CABG scar site, when the weather changes, she aches slightly.   Medication Adjustments/Labs and Tests Ordered: Current medicines are reviewed at length with the patient today.  Concerns regarding medicines are outlined above.  Orders Placed This Encounter  Procedures  . Comprehensive metabolic panel  . Lipid panel   Meds ordered this encounter  Medications  . aspirin 81 MG EC tablet    Sig: Take 1 tablet (81 mg total) by mouth daily.    Patient Instructions  Medication Instructions:  Please decrease your Aspirin to 81 mg daily. Continue all other medications as listed. You may use Coricidin HBP for cold signs/symptoms if necessary.  If you need a refill on your cardiac medications before your next appointment, please call your pharmacy.   Lab work: Please have Lipid/CMP today. If you have labs (blood work) drawn today and your tests are completely normal, you will receive your results only by: Marland Kitchen MyChart Message (if you have MyChart) OR . A paper copy in the mail If you have any lab test that is abnormal or we need to change your treatment, we will call you to review the results.  Follow-Up: At [facility], you and your health needs are our priority.  As part of our continuing mission to provide you with exceptional heart care, we have created designated Provider Care Teams.  These Care Teams include your primary Cardiologist (physician) and Advanced Practice Providers (APPs -  Physician Assistants and Nurse Practitioners) who all work together to provide you with the care you need, when  you need it. You will need a follow  up appointment in 6 months with Norma Fredrickson, NP and 1 year with Dr Anne Fu.  Please call our office 2 months in advance to schedule this appointment.  You may see Donato Schultz, MD or one of the following Advanced Practice Providers on your designated Care Team:   Norma Fredrickson, NP Nada Boozer, NP . Georgie Chard, NP  Thank you for choosing [facility]!!        Signed, Donato Schultz, MD  10/08/2018 5:00 PM    [facility]

## 2018-10-09 ENCOUNTER — Other Ambulatory Visit: Payer: Self-pay | Admitting: *Deleted

## 2018-10-09 DIAGNOSIS — N183 Chronic kidney disease, stage 3 unspecified: Secondary | ICD-10-CM

## 2018-10-09 DIAGNOSIS — E1122 Type 2 diabetes mellitus with diabetic chronic kidney disease: Secondary | ICD-10-CM

## 2018-10-09 DIAGNOSIS — E875 Hyperkalemia: Secondary | ICD-10-CM

## 2018-10-09 DIAGNOSIS — Z79899 Other long term (current) drug therapy: Secondary | ICD-10-CM

## 2018-10-09 LAB — COMPREHENSIVE METABOLIC PANEL
ALT: 8 IU/L (ref 0–32)
AST: 13 IU/L (ref 0–40)
Albumin/Globulin Ratio: 2 (ref 1.2–2.2)
Albumin: 4.7 g/dL (ref 3.8–4.8)
Alkaline Phosphatase: 89 IU/L (ref 39–117)
BUN/Creatinine Ratio: 17 (ref 12–28)
BUN: 34 mg/dL — ABNORMAL HIGH (ref 8–27)
Bilirubin Total: <0.2 mg/dL (ref 0.0–1.2)
CO2: 15 mmol/L — ABNORMAL LOW (ref 20–29)
Calcium: 9.3 mg/dL (ref 8.7–10.3)
Chloride: 111 mmol/L — ABNORMAL HIGH (ref 96–106)
Creatinine, Ser: 2.06 mg/dL — ABNORMAL HIGH (ref 0.57–1.00)
GFR calc Af Amer: 29 mL/min/{1.73_m2} — ABNORMAL LOW (ref >59)
GFR calc non Af Amer: 25 mL/min/{1.73_m2} — ABNORMAL LOW (ref >59)
Globulin, Total: 2.3 g/dL (ref 1.5–4.5)
Glucose: 86 mg/dL (ref 65–99)
Potassium: 5.9 mmol/L (ref 3.5–5.2)
Sodium: 143 mmol/L (ref 134–144)
Total Protein: 7 g/dL (ref 6.0–8.5)

## 2018-10-09 LAB — LIPID PANEL
Chol/HDL Ratio: 3 ratio (ref 0.0–4.4)
Cholesterol, Total: 108 mg/dL (ref 100–199)
HDL: 36 mg/dL — ABNORMAL LOW (ref >39)
LDL Calculated: 35 mg/dL (ref 0–99)
Triglycerides: 186 mg/dL — ABNORMAL HIGH (ref 0–149)
VLDL Cholesterol Cal: 37 mg/dL (ref 5–40)

## 2018-10-09 MED ORDER — FAMOTIDINE 20 MG PO TABS: 20.0000 mg | ORAL_TABLET | Freq: Every day | ORAL | 6 refills | Status: DC

## 2018-10-17 ENCOUNTER — Other Ambulatory Visit: Payer: Managed Care, Other (non HMO) | Admitting: *Deleted

## 2018-10-17 ENCOUNTER — Other Ambulatory Visit: Payer: Self-pay

## 2018-10-17 DIAGNOSIS — E1122 Type 2 diabetes mellitus with diabetic chronic kidney disease: Secondary | ICD-10-CM

## 2018-10-17 DIAGNOSIS — Z79899 Other long term (current) drug therapy: Secondary | ICD-10-CM

## 2018-10-17 DIAGNOSIS — E875 Hyperkalemia: Secondary | ICD-10-CM

## 2018-10-18 LAB — BASIC METABOLIC PANEL
BUN/Creatinine Ratio: 17 (ref 12–28)
BUN: 29 mg/dL — ABNORMAL HIGH (ref 8–27)
CO2: 18 mmol/L — ABNORMAL LOW (ref 20–29)
Calcium: 9.6 mg/dL (ref 8.7–10.3)
Chloride: 107 mmol/L — ABNORMAL HIGH (ref 96–106)
Creatinine, Ser: 1.75 mg/dL — ABNORMAL HIGH (ref 0.57–1.00)
GFR calc Af Amer: 35 mL/min/{1.73_m2} — ABNORMAL LOW (ref >59)
GFR calc non Af Amer: 31 mL/min/{1.73_m2} — ABNORMAL LOW (ref >59)
Glucose: 82 mg/dL (ref 65–99)
Potassium: 5.2 mmol/L (ref 3.5–5.2)
Sodium: 142 mmol/L (ref 134–144)

## 2018-10-28 ENCOUNTER — Ambulatory Visit: Payer: Managed Care, Other (non HMO) | Admitting: Cardiology

## 2018-11-06 ENCOUNTER — Telehealth: Payer: Self-pay | Admitting: Cardiology

## 2018-11-06 NOTE — Telephone Encounter (Signed)
LMTCB

## 2018-11-06 NOTE — Telephone Encounter (Signed)
Patient is calling about a script that Dr. Marlou Porch wrote for her famotidine (PEPCID) 20 MG tablet, when she went to pick it up the pharmicst told her she can get the over the counter.  She wants to know if Dr. Marlou Porch wants her to go back on the other medication she was on before.

## 2018-11-08 NOTE — Telephone Encounter (Signed)
lpmtcb 6/12 

## 2018-11-13 NOTE — Telephone Encounter (Signed)
Pt said everything has been taken of now as far as she knows but she is c/o indigestion.  She would like to know if Dr Marlou Porch thinks she should go back on the omeprazole  40 mg since she wasn't having indigestion then.  Advised I will ask him and let her know.

## 2018-11-14 MED ORDER — OMEPRAZOLE 40 MG PO CPDR: 40.0000 mg | DELAYED_RELEASE_CAPSULE | Freq: Every day | ORAL | 11 refills | Status: AC

## 2018-11-14 NOTE — Telephone Encounter (Signed)
Pt aware RX sent into her pharmacy for omeprazole.  She will f/u with PCP as instructed.

## 2018-11-14 NOTE — Telephone Encounter (Signed)
I have no problem with her going back on omeprazole 40 mg once a day and stopping the Pepcid. She may wish to consult with her primary doctor over duration of use of omeprazole. Candee Furbish, MD

## 2019-04-08 ENCOUNTER — Ambulatory Visit: Payer: Self-pay | Admitting: Orthopedic Surgery

## 2019-04-28 ENCOUNTER — Telehealth: Payer: Self-pay | Admitting: *Deleted

## 2019-04-28 ENCOUNTER — Encounter: Payer: Self-pay | Admitting: *Deleted

## 2019-04-28 NOTE — Telephone Encounter (Signed)
   [facility] Pre-operative Risk Assessment    Request for surgical clearance:  1. What type of surgery is being performed? RIGHT TOTAL KNEE   2. When is this surgery scheduled? 05/28/19   3. What type of clearance is required (medical clearance vs. Pharmacy clearance to hold med vs. Both)? MEDICAL  4. Are there any medications that need to be held prior to surgery and how long? ASA    5. Practice name and name of physician performing surgery? [facility]; DR. Rod Can   6. What is your office phone number [phone]    7.   What is your office fax number [phone]  8.   Anesthesia type (None, local, MAC, general) ? SPINAL   Julaine Hua 04/28/2019, 1:18 PM  _________________________________________________________________   (provider comments below)

## 2019-04-28 NOTE — Telephone Encounter (Signed)
I think she can be cleared with a virtual visit. She had CABG in January. There is an EKG in Epic from November. As long as she is without symptoms and she has continued to improve after surgery, I think she can be cleared.

## 2019-04-28 NOTE — Telephone Encounter (Signed)
This is a virtual visit on 05/01/19 just checking to see if OK

## 2019-04-28 NOTE — Telephone Encounter (Signed)
Virtual Visit Pre-Appointment Phone Call  "(Name), I am calling you today to discuss your upcoming appointment. We are currently trying to limit exposure to the virus that causes COVID-19 by seeing patients at home rather than in the office."  1. "What is the BEST phone number to call the day of the visit?" - include this in appointment notes  2. "Do you have or have access to (through a family member/friend) a smartphone with video capability that we can use for your visit?" a. If yes - list this number in appt notes as "cell" (if different from BEST phone #) and list the appointment type as a VIDEO visit in appointment notes b. If no - list the appointment type as a PHONE visit in appointment notes  3. Confirm consent - "In the setting of the current Covid19 crisis, you are scheduled for a (phone or video) visit with your provider on (date) at (time).  Just as we do with many in-office visits, in order for you to participate in this visit, we must obtain consent.  If you'd like, I can send this to your mychart (if signed up) or email for you to review.  Otherwise, I can obtain your verbal consent now.  All virtual visits are billed to your insurance company just like a normal visit would be.  By agreeing to a virtual visit, we'd like you to understand that the technology does not allow for your provider to perform an examination, and thus may limit your provider's ability to fully assess your condition. If your provider identifies any concerns that need to be evaluated in person, we will make arrangements to do so.  Finally, though the technology is pretty good, we cannot assure that it will always work on either your or our end, and in the setting of a video visit, we may have to convert it to a phone-only visit.  In either situation, we cannot ensure that we have a secure connection.  Are you willing to proceed?" STAFF: Did the patient verbally acknowledge consent to telehealth visit? Document  YES/NO here: YES PT GAVE VERBAL CONSENT  4. Advise patient to be prepared - "Two hours prior to your appointment, go ahead and check your blood pressure, pulse, oxygen saturation, and your weight (if you have the equipment to check those) and write them all down. When your visit starts, your provider will ask you for this information. If you have an Apple Watch or Kardia device, please plan to have heart rate information ready on the day of your appointment. Please have a pen and paper handy nearby the day of the visit as well."  5. Give patient instructions for MyChart download to smartphone OR Doximity/Doxy.me as below if video visit (depending on what platform provider is using)  6. Inform patient they will receive a phone call 15 minutes prior to their appointment time (may be from unknown caller ID) so they should be prepared to answer    TELEPHONE CALL NOTE  DEIDRE CARINO has been deemed a candidate for a follow-up tele-health visit to limit community exposure during the Covid-19 pandemic. I spoke with the patient via phone to ensure availability of phone/video source, confirm preferred email & phone number, and discuss instructions and expectations.  I reminded KURA BETHARDS to be prepared with any vital sign and/or heart rhythm information that could potentially be obtained via home monitoring, at the time of her visit. I reminded JHANA GIARRATANO to expect a  phone call prior to her visit.  Elliot Cousin, RMA 04/28/2019 10:51 AM   INSTRUCTIONS FOR DOWNLOADING THE MYCHART APP TO SMARTPHONE  - The patient must first make sure to have activated MyChart and know their login information - If Apple, go to [employer] and type in MyChart in the search bar and download the app. If Android, ask patient to go to [employer] and type in [place] in the search bar and download the app. The app is free but as with any other app downloads, their phone may require them to verify saved payment  information or Apple/Android password.  - The patient will need to then log into the app with their MyChart username and password, and select [facility] as their healthcare provider to link the account. When it is time for your visit, go to the MyChart app, find appointments, and click Begin Video Visit. Be sure to Select Allow for your device to access the Microphone and Camera for your visit. You will then be connected, and your provider will be with you shortly.  **If they have any issues connecting, or need assistance please contact MyChart service desk [phone])**  **If using a computer, in order to ensure the best quality for their visit they will need to use either of the following Internet Browsers: [employer] or Google Chrome**  IF USING DOXIMITY or DOXY.ME - The patient will receive a link just prior to their visit by text.     FULL LENGTH CONSENT FOR TELE-HEALTH VISIT   I hereby voluntarily request, consent and authorize [facility] and its employed or contracted physicians, physician assistants, nurse practitioners or other licensed health care professionals (the Practitioner), to provide me with telemedicine health care services (the "Services") as deemed necessary by the treating Practitioner. I acknowledge and consent to receive the Services by the Practitioner via telemedicine. I understand that the telemedicine visit will involve communicating with the Practitioner through live audiovisual communication technology and the disclosure of certain medical information by electronic transmission. I acknowledge that I have been given the opportunity to request an in-person assessment or other available alternative prior to the telemedicine visit and am voluntarily participating in the telemedicine visit.  I understand that I have the right to withhold or withdraw my consent to the use of telemedicine in the course of my care at any time, without affecting my right  to future care or treatment, and that the Practitioner or I may terminate the telemedicine visit at any time. I understand that I have the right to inspect all information obtained and/or recorded in the course of the telemedicine visit and may receive copies of available information for a reasonable fee.  I understand that some of the potential risks of receiving the Services via telemedicine include:  Marland Kitchen Delay or interruption in medical evaluation due to technological equipment failure or disruption; . Information transmitted may not be sufficient (e.g. poor resolution of images) to allow for appropriate medical decision making by the Practitioner; and/or  . In rare instances, security protocols could fail, causing a breach of personal health information.  Furthermore, I acknowledge that it is my responsibility to provide information about my medical history, conditions and care that is complete and accurate to the best of my ability. I acknowledge that Practitioner's advice, recommendations, and/or decision may be based on factors not within their control, such as incomplete or inaccurate data provided by me or distortions of diagnostic images or specimens that may result  from electronic transmissions. I understand that the practice of medicine is not an exact science and that Practitioner makes no warranties or guarantees regarding treatment outcomes. I acknowledge that I will receive a copy of this consent concurrently upon execution via email to the email address I last provided but may also request a printed copy by calling the office of [facility].    I understand that my insurance will be billed for this visit.   I have read or had this consent read to me. . I understand the contents of this consent, which adequately explains the benefits and risks of the Services being provided via telemedicine.  . I have been provided ample opportunity to ask questions regarding this consent and the Services  and have had my questions answered to my satisfaction. . I give my informed consent for the services to be provided through the use of telemedicine in my medical care  By participating in this telemedicine visit I agree to the above.

## 2019-04-28 NOTE — Telephone Encounter (Signed)
This pt has appt on 12/3 with Cecilie Kicks, NP and preop clearance can be addressed at that time.  I will route this request to her.

## 2019-05-01 ENCOUNTER — Other Ambulatory Visit: Payer: Self-pay

## 2019-05-01 ENCOUNTER — Encounter: Payer: Self-pay | Admitting: Cardiology

## 2019-05-01 ENCOUNTER — Telehealth (INDEPENDENT_AMBULATORY_CARE_PROVIDER_SITE_OTHER): Payer: Managed Care, Other (non HMO) | Admitting: Cardiology

## 2019-05-01 VITALS — BP 120/88 | Ht 63.0 in | Wt 142.6 lb

## 2019-05-01 DIAGNOSIS — E785 Hyperlipidemia, unspecified: Secondary | ICD-10-CM | POA: Diagnosis not present

## 2019-05-01 DIAGNOSIS — I25119 Atherosclerotic heart disease of native coronary artery with unspecified angina pectoris: Secondary | ICD-10-CM | POA: Diagnosis not present

## 2019-05-01 DIAGNOSIS — N183 Chronic kidney disease, stage 3 unspecified: Secondary | ICD-10-CM

## 2019-05-01 DIAGNOSIS — E1122 Type 2 diabetes mellitus with diabetic chronic kidney disease: Secondary | ICD-10-CM | POA: Diagnosis not present

## 2019-05-01 DIAGNOSIS — Z01818 Encounter for other preprocedural examination: Secondary | ICD-10-CM

## 2019-05-01 DIAGNOSIS — Z951 Presence of aortocoronary bypass graft: Secondary | ICD-10-CM

## 2019-05-01 DIAGNOSIS — I2510 Atherosclerotic heart disease of native coronary artery without angina pectoris: Secondary | ICD-10-CM

## 2019-05-01 DIAGNOSIS — I129 Hypertensive chronic kidney disease with stage 1 through stage 4 chronic kidney disease, or unspecified chronic kidney disease: Secondary | ICD-10-CM

## 2019-05-01 DIAGNOSIS — Z7982 Long term (current) use of aspirin: Secondary | ICD-10-CM

## 2019-05-01 DIAGNOSIS — I10 Essential (primary) hypertension: Secondary | ICD-10-CM

## 2019-05-01 DIAGNOSIS — Z79899 Other long term (current) drug therapy: Secondary | ICD-10-CM

## 2019-05-01 NOTE — Patient Instructions (Signed)
Medication Instructions:  Your physician recommends that you continue on your current medications as directed. Please refer to the Current Medication list given to you today.  *If you need a refill on your cardiac medications before your next appointment, please call your pharmacy*  Lab Work: None ordered  If you have labs (blood work) drawn today and your tests are completely normal, you will receive your results only by: . MyChart Message (if you have MyChart) OR . A paper copy in the mail If you have any lab test that is abnormal or we need to change your treatment, we will call you to review the results.  Testing/Procedures: None ordered  Follow-Up: At [facility], you and your health needs are our priority.  As part of our continuing mission to provide you with exceptional heart care, we have created designated Provider Care Teams.  These Care Teams include your primary Cardiologist (physician) and Advanced Practice Providers (APPs -  Physician Assistants and Nurse Practitioners) who all work together to provide you with the care you need, when you need it.  Your next appointment:   6 month(s)  The format for your next appointment:   In Person  Provider:   You may see Mark Skains, MD or one of the following Advanced Practice Providers on your designated Care Team:    Lori Gerhardt, NP  Laura Ingold, NP  Jill McDaniel, NP   Other Instructions   

## 2019-05-01 NOTE — Progress Notes (Signed)
Virtual Visit via Telephone Note   This visit type was conducted due to national recommendations for restrictions regarding the COVID-19 Pandemic (e.g. social distancing) in an effort to limit this patient's exposure and mitigate transmission in our community.  Due to her co-morbid illnesses, this patient is at least at moderate risk for complications without adequate follow up.  This format is felt to be most appropriate for this patient at this time.  The patient did not have access to video technology/had technical difficulties with video requiring transitioning to audio format only (telephone).  All issues noted in this document were discussed and addressed.  No physical exam could be performed with this format.  Please refer to the patient's chart for her  consent to telehealth for [facility].   Date:  05/01/2019   ID:  Cassidy CandySusan S Willison, DOB December 30, 1955, MRN [id number]  Patient Location: Home Provider Location: Office  PCP:  Galvin ProfferHague, Imran P, MD  Cardiologist:  Donato SchultzMark Skains, MD  Electrophysiologist:  None   Evaluation Performed:  Follow-Up Visit  Chief Complaint:  Needs Rt total Knee  History of Present Illness:    Cassidy Green is a 63 y.o. female with pre op for Rt total knee 05/28/19 with Dr. Linna CapriceSwinteck.   She has a hx of CAD with CABG, DM and HTN, CKD followed by renal in [place].   On Repatha due to intolerance to statins.   Last cardiac cath 06/12/18  With multivessel disease and underwent CABG X 4 06/14/2017 with   -[name] to MID LAD -SVG to DISTAL LAD -SEQ SVG to DIAGONAL 1 and OM 1 Did have post op a fib and was on amiodarone which caused nausea, and Dr. Dorris FetchHendrickson stopped 07/23/18 6 weeks post op.  She did not require anticoagulation.    Today she is doing well.  No chest pain or SOB.  She was active without issues until Oct and she injured her knee in addition to her significant arthritis as well. [facility].  She has not palpations or  racing heat rate.  Her kidney disease increased with NSAIDS but since stopping those her Cr is back to 1.55 from 2.04.  Recent EKG is stable.    The patient does not have symptoms concerning for COVID-19 infection (fever, chills, cough, or new shortness of breath).    Past Medical History:  Diagnosis Date   Anemia    Anxiety    Arthritis    CAD (coronary artery disease)    S/P CABG 05/2018   Diabetes mellitus without complication (HCC)    type II   Female bladder prolapse    GERD (gastroesophageal reflux disease)    Headache    History of bronchitis    History of pneumonia    Hyperlipidemia    Hypertension    Joint inflammation    Past Surgical History:  Procedure Laterality Date   ABDOMINAL HYSTERECTOMY  1992   COLONOSCOPY     CORONARY ARTERY BYPASS GRAFT N/A 06/14/2018   Procedure: CORONARY ARTERY BYPASS GRAFTING (CABG) x4  using left internal mammary artery and right greater saphenous vein harvested endoscopically.;  Surgeon: Loreli SlotHendrickson, Steven C, MD;  Location: [facility] OR;  Service: Open Heart Surgery;  Laterality: N/A;   INCISION AND DRAINAGE HIP Left 09/04/2014   Procedure: IRRIGATION AND DEBRIDEMENT  LEFT HIP WOUND WITH CLOSURE ;  Surgeon: Samson FredericBrian Swinteck, MD;  Location: [facility] OR;  Service: Orthopedics;  Laterality: Left;   LEFT  HEART CATH AND CORONARY ANGIOGRAPHY N/A 06/12/2018   Procedure: LEFT HEART CATH AND CORONARY ANGIOGRAPHY;  Surgeon: Troy Sine, MD;  Location: [place] [facility];  Service: Cardiovascular;  Laterality: N/A;   LUMBAR LAMINECTOMY/DECOMPRESSION MICRODISCECTOMY  03/11/2015   Procedure: Lumbar Four-Five DECOMPRESSION  AND REMOVAL SYNOVAL CYST;  Surgeon: Melina Schools, MD;  Location: [place];  Service: Orthopedics;;   TEE WITHOUT CARDIOVERSION N/A 06/14/2018   Procedure: TRANSESOPHAGEAL ECHOCARDIOGRAM (TEE);  Surgeon: Melrose Nakayama, MD;  Location: [place];  Service: Open Heart Surgery;  Laterality: N/A;   TOTAL HIP ARTHROPLASTY Left  07/30/2014   Procedure: LEFT TOTAL HIP ARTHROPLASTY ANTERIOR APPROACH;  Surgeon: Elie Goody, MD;  Location: [facility];  Service: Orthopedics;  Laterality: Left;   TUBAL LIGATION     ULTRASOUND GUIDANCE FOR VASCULAR ACCESS  06/12/2018   Procedure: Ultrasound Guidance For Vascular Access;  Surgeon: Troy Sine, MD;  Location: [place] [facility];  Service: Cardiovascular;;     Current Meds  Medication Sig   albuterol (PROVENTIL HFA;VENTOLIN HFA) 108 (90 Base) MCG/ACT inhaler Inhale 2 puffs into the lungs every 4 (four) hours as needed for wheezing.   aspirin 81 MG EC tablet Take 1 tablet (81 mg total) by mouth daily.   buPROPion (WELLBUTRIN) 100 MG tablet Take 100 mg by mouth daily as needed (anxiety).    Evolocumab (REPATHA SURECLICK) 102 MG/ML SOAJ Inject 140 mg into the skin every 14 (fourteen) days.   ezetimibe (ZETIA) 10 MG tablet Take 1 tablet (10 mg total) by mouth at bedtime.   Ferrous Fumarate (HEMOCYTE PO) Take 1 tablet by mouth daily.    fluticasone (FLONASE) 50 MCG/ACT nasal spray Place 1 spray into both nostrils daily.   folic acid (FOLVITE) 1 MG tablet Take 1 mg by mouth at bedtime.   loratadine (CLARITIN) 10 MG tablet Take 1 tablet (10 mg total) by mouth daily.   metoprolol tartrate (LOPRESSOR) 50 MG tablet Take 1.5 tablets (75 mg total) by mouth 2 (two) times daily.   Omega-3 Fatty Acids (FISH OIL) 1200 MG CAPS Take 1,200 mg by mouth daily.   omeprazole (PRILOSEC) 40 MG capsule Take 1 capsule (40 mg total) by mouth daily.   sitaGLIPtin-metformin (JANUMET) 50-1000 MG per tablet Take 1 tablet by mouth 2 (two) times daily with a meal.    sodium bicarbonate 650 MG tablet Take 650 mg by mouth 2 (two) times daily.   traMADol (ULTRAM) 50 MG tablet Take 1 tablet (50 mg total) by mouth every 6 (six) hours as needed.     Allergies:   Statins and Nsaids   Social History   Tobacco Use   Smoking status: Former Smoker    Quit date: 02/26/2001    Years since  quitting: 18.1   Smokeless tobacco: Never Used  Substance Use Topics   Alcohol use: Yes    Comment: OCCASIONAL   Drug use: No     Family Hx: The patient's family history is not on file.  ROS:   Please see the history of present illness.    General:no colds or fevers, no weight changes Skin:no rashes or ulcers HEENT:no blurred vision, no congestion CV:see HPI PUL:see HPI GI:no diarrhea constipation or melena, no indigestion GU:no hematuria, no dysuria MS:no joint pain, no claudication Neuro:no syncope, no lightheadedness Endo:no diabetes, no thyroid disease  All other systems reviewed and are negative.   Prior CV studies:   The following studies were reviewed today:   06/12/18  Cardiac catheterization  06/12/2018-multivessel CAD.  06/14/18  CABG X 4  Echo 06/12/18 Study Conclusions  - Left ventricle: The cavity size was normal. Wall thickness was   normal. Systolic function was normal. The estimated ejection   fraction was in the range of 50% to 55%. Moderate hypokinesis of   the basalinferior myocardium. Features are consistent with a   pseudonormal left ventricular filling pattern, with concomitant   abnormal relaxation and increased filling pressure (grade 2   diastolic dysfunction).  Labs/Other Tests and Data Reviewed:    EKG:  An ECG dated 04/07/19 was personally reviewed today and demonstrated:  SR wtih poor R wave progression cannot rule out ant MI but no changes from prior EKG.  Recent Labs: 06/15/2018: Magnesium 2.1 07/03/2018: Hemoglobin 11.5; Platelets 483 10/08/2018: ALT 8 10/17/2018: BUN 29; Creatinine, Ser 1.75; Potassium 5.2; Sodium 142   Recent Lipid Panel Lab Results  Component Value Date/Time   CHOL 108 10/08/2018 04:37 PM   TRIG 186 (H) 10/08/2018 04:37 PM   HDL 36 (L) 10/08/2018 04:37 PM   CHOLHDL 3.0 10/08/2018 04:37 PM   CHOLHDL 13.0 06/12/2018 04:43 AM   LDLCALC 35 10/08/2018 04:37 PM   LDLDIRECT 198 (H) 06/12/2018 04:43 AM    Wt  Readings from Last 3 Encounters:  05/01/19 142 lb 9.6 oz (64.7 kg)  10/08/18 142 lb 9.6 oz (64.7 kg)  07/23/18 136 lb 8 oz (61.9 kg)     Objective:    Vital Signs:  BP 120/88    Ht 5\' 3"  (1.6 m)    Wt 142 lb 9.6 oz (64.7 kg)    BMI 25.26 kg/m    VITAL SIGNS:  reviewed  General:  NAD  Pulmonary can speak in complete sentences without SOB Neuro A&O X 3 follows commands Psych pleasant affect   ASSESSMENT & PLAN:    1. Pre-op for Rt total knee - CABG I/17/20 and has been stable without angina since.  0.9% risk of Major cardiac event.  She may hold ASA for 5 days prior and resume post op, I will check with Dr. 12-21-1975 to confirm ASA.  2. CAD with no prior MI but angina with + stress test, cath with significant CAD and then CABG X 4.  Stable continue ASA and BB  Follow up in 6 months with Dr. Anne Fu.   3. HLD unable to take statins on Repatha. Stable 4. HTN controlled no change in meds 5. DM-2 per PCP.   6. CKD-3 followed by Renal - bump in Cr on NSAIDS but improved once stopped.   COVID-19 Education: The signs and symptoms of COVID-19 were discussed with the patient and how to seek care for testing (follow up with PCP or arrange E-visit).  The importance of social distancing was discussed today.  Time:   Today, I have spent 15 minutes with the patient with telehealth technology discussing the above problems.     Medication Adjustments/Labs and Tests Ordered: Current medicines are reviewed at length with the patient today.  Concerns regarding medicines are outlined above.   Tests Ordered: No orders of the defined types were placed in this encounter.   Medication Changes: No orders of the defined types were placed in this encounter.   Follow Up:  In Person in 6 month(s)  Signed, Anne Fu, NP  05/01/2019 4:59 PM     [facility]

## 2019-05-12 NOTE — Telephone Encounter (Signed)
   Primary Cardiologist: Candee Furbish, MD  Chart reviewed as part of pre-operative protocol coverage. Patient was seen by Cecilie Kicks, NP 05/01/2019 and cleared to proceed with planned procedure without further cardiovascular testing. Dr. Marlou Porch reviewed patient's history and recommended holding aspirin 5 days prior to her procedure with plans to resume as soon as she is cleared to do so by her surgeon.   I will route this recommendation and Karilyn Cota office note to the requesting party via Epic fax function and remove from pre-op pool.  Please call with questions.  Abigail Butts, PA-C 05/12/2019, 8:31 AM

## 2019-05-19 NOTE — Patient Instructions (Addendum)
DUE TO COVID-19 ONLY ONE VISITOR IS ALLOWED TO COME WITH YOU AND STAY IN THE WAITING ROOM ONLY DURING PRE OP AND PROCEDURE DAY OF SURGERY. THE 1 VISITOR MAY VISIT WITH YOU AFTER SURGERY IN YOUR PRIVATE ROOM DURING VISITING HOURS ONLY!  YOU NEED TO HAVE A COVID 19 TEST ON: 05/24/19   @ 12:00 PM_, THIS TEST MUST BE DONE BEFORE SURGERY, COME  [place], [place] , [id number].  ([place]) ONCE YOUR COVID TEST IS COMPLETED, PLEASE BEGIN THE QUARANTINE INSTRUCTIONS AS OUTLINED IN YOUR HANDOUT.                Cassidy Green     Your procedure is scheduled on: 05/28/19   Report to [facility] Main  Entrance   Report to short stay at  5:30 AM     Call this number if you have problems the morning of surgery [phone]    Remember:  Cassidy Green, NO Beltrami.     Take these medicines the morning of surgery with A SIP OF WATER: Bupropion,loratadine,metoprolol,omeprazole,ultram as needed. Flonase.   NO SOLID FOOD AFTER MIDNIGHT THE NIGHT PRIOR TO SURGERY. NOTHING BY MOUTH EXCEPT CLEAR LIQUIDS UNTIL: 4:30 AM . PLEASE FINISH your Gatorade DRINK PER SURGEON ORDER  WHICH NEEDS TO BE COMPLETED AT : 4:30 AM     CLEAR LIQUID DIET   Foods Allowed                                                                     Foods Excluded  Coffee and tea, regular and decaf                             liquids that you cannot  Plain Jell-O any favor except red or purple                                           see through such as: Fruit ices (not with fruit pulp)                                     milk, soups, orange juice  Iced Popsicles                                    All solid food Carbonated beverages, regular and diet                                    Cranberry, grape and apple juices Sports drinks like Gatorade Lightly seasoned clear broth or consume(fat free) Sugar, honey syrup  Sample  Menu Breakfast  Lunch                                     Supper Cranberry juice                    Beef broth                            Chicken broth Jell-O                                     Grape juice                           Apple juice Coffee or tea                        Jell-O                                      Popsicle                                                Coffee or tea                        Coffee or tea  _____________________________________________________________________  How to Manage Your Diabetes Before and After Surgery  Why is it important to control my blood sugar before and after surgery? . Improving blood sugar levels before and after surgery helps healing and can limit problems. . A way of improving blood sugar control is eating a healthy diet by: o  Eating less sugar and carbohydrates o  Increasing activity/exercise o  Talking with your doctor about reaching your blood sugar goals . High blood sugars (greater than 180 mg/dL) can raise your risk of infections and slow your recovery, so you will need to focus on controlling your diabetes during the weeks before surgery. . Make sure that the doctor who takes care of your diabetes knows about your planned surgery including the date and location.  How do I manage my blood sugar before surgery? . Check your blood sugar at least 4 times a day, starting 2 days before surgery, to make sure that the level is not too high or low. o Check your blood sugar the morning of your surgery when you wake up and every 2 hours until you get to the Short Stay unit. . If your blood sugar is less than 70 mg/dL, you will need to treat for low blood sugar: o Do not take insulin. o Treat a low blood sugar (less than 70 mg/dL) with  cup of clear juice (cranberry or apple), 4 glucose tablets, OR glucose gel. o Recheck blood sugar in 15 minutes after treatment (to make sure it is greater than 70  mg/dL). If your blood sugar is not greater than 70 mg/dL on recheck, call [phone] for further instructions. . Report your blood sugar to the short stay nurse when you get to Short Stay.  . If you  are admitted to the hospital after surgery: o Your blood sugar will be checked by the staff and you will probably be given insulin after surgery (instead of oral diabetes medicines) to make sure you have good blood sugar levels. o The goal for blood sugar control after surgery is 80-180 mg/dL.   WHAT DO I DO ABOUT MY DIABETES MEDICATION?  DO NOT TAKE ANY DIABETIC MEDICATIONS DAY OF YOUR SURGERY  . The Day before your surgery: Take Janument as usual       . THE MORNING OF SURGERY;: Do not take Janumet    Patient Signature:  Date:   Nurse Signature:  Date:   Reviewed and Endorsed by [facility] Patient Education Committee, August 2015                                You may not have any metal on your body including hair pins and              piercings  Do not wear jewelry, make-up, lotions, powders or perfumes, deodorant             Do not wear nail polish on your fingernails.  Do not shave  48 hours prior to surgery.              Men may shave face and neck.   Do not bring valuables to the hospital. [name] IS NOT             RESPONSIBLE   FOR VALUABLES.  Contacts, dentures or bridgework may not be worn into surgery.  Leave suitcase in the car. After surgery it may be brought to your room.     Patients discharged the day of surgery will not be allowed to drive home. IF YOU ARE HAVING SURGERY AND GOING HOME THE SAME DAY, YOU MUST HAVE AN ADULT TO DRIVE YOU HOME AND BE WITH YOU FOR 24 HOURS. YOU MAY GO HOME BY TAXI OR UBER OR ORTHERWISE, BUT AN ADULT MUST ACCOMPANY YOU HOME AND STAY WITH YOU FOR 24 HOURS.  Name and phone number of your driver:  Special Instructions: N/A              Please read over the following fact sheets you were  given: _____________________________________________________________________  [facility] - Preparing for Surgery Before surgery, you can play an important role.  Because skin is not sterile, your skin needs to be as free of germs as possible.  You can reduce the number of germs on your skin by washing with CHG (chlorahexidine gluconate) soap before surgery.  CHG is an antiseptic cleaner which kills germs and bonds with the skin to continue killing germs even after washing. Please DO NOT use if you have an allergy to CHG or antibacterial soaps.  If your skin becomes reddened/irritated stop using the CHG and inform your nurse when you arrive at Short Stay. Do not shave (including legs and underarms) for at least 48 hours prior to the first CHG shower.  You may shave your face/neck. Please follow these instructions carefully:  1.  Shower with CHG Soap the night before surgery and the  morning of Surgery.  2.  If you choose to wash your hair, wash your hair first as usual with your  normal  shampoo.  3.  After you shampoo, rinse your hair and body thoroughly to remove the  shampoo.  4.  Use CHG as you would any other liquid soap.  You can apply chg directly  to the skin and wash                       Gently with a scrungie or clean washcloth.  5.  Apply the CHG Soap to your body ONLY FROM THE NECK DOWN.   Do not use on face/ open                           Wound or open sores. Avoid contact with eyes, ears mouth and genitals (private parts).                       Wash face,  Genitals (private parts) with your normal soap.             6.  Wash thoroughly, paying special attention to the area where your surgery  will be performed.  7.  Thoroughly rinse your body with warm water from the neck down.  8.  DO NOT shower/wash with your normal soap after using and rinsing off  the CHG Soap.                9.  Pat yourself dry with a clean towel.            10.  Wear clean pajamas.             11.  Place clean sheets on your bed the night of your first shower and do not  sleep with pets. Day of Surgery : Do not apply any lotions/deodorants the morning of surgery.  Please wear clean clothes to the hospital/surgery center.  FAILURE TO FOLLOW THESE INSTRUCTIONS MAY RESULT IN THE CANCELLATION OF YOUR SURGERY PATIENT SIGNATURE_________________________________  NURSE SIGNATURE__________________________________  ________________________________________________________________________   [name]  An incentive spirometer is a tool that can help keep your lungs clear and active. This tool measures how well you are filling your lungs with each breath. Taking long deep breaths may help reverse or decrease the chance of developing breathing (pulmonary) problems (especially infection) following:  A long period of time when you are unable to move or be active. BEFORE THE PROCEDURE   If the spirometer includes an indicator to show your best effort, your nurse or respiratory therapist will set it to a desired goal.  If possible, sit up straight or lean slightly forward. Try not to slouch.  Hold the incentive spirometer in an upright position. INSTRUCTIONS FOR USE  1. Sit on the edge of your bed if possible, or sit up as far as you can in bed or on a chair. 2. Hold the incentive spirometer in an upright position. 3. Breathe out normally. 4. Place the mouthpiece in your mouth and seal your lips tightly around it. 5. Breathe in slowly and as deeply as possible, raising the piston or the ball toward the top of the column. 6. Hold your breath for 3-5 seconds or for as long as possible. Allow the piston or ball to fall to the bottom of the column. 7. Remove the mouthpiece from your mouth and breathe out normally. 8. Rest for a few seconds and repeat Steps 1 through 7 at least 10 times every 1-2 hours when you are awake. Take your time and take a few normal breaths between deep  breaths. 9. The spirometer may include an indicator to  show your best effort. Use the indicator as a goal to work toward during each repetition. 10. After each set of 10 deep breaths, practice coughing to be sure your lungs are clear. If you have an incision (the cut made at the time of surgery), support your incision when coughing by placing a pillow or rolled up towels firmly against it. Once you are able to get out of bed, walk around indoors and cough well. You may stop using the incentive spirometer when instructed by your caregiver.  RISKS AND COMPLICATIONS  Take your time so you do not get dizzy or light-headed.  If you are in pain, you may need to take or ask for pain medication before doing incentive spirometry. It is harder to take a deep breath if you are having pain. AFTER USE  Rest and breathe slowly and easily.  It can be helpful to keep track of a log of your progress. Your caregiver can provide you with a simple table to help with this. If you are using the spirometer at home, follow these instructions: SEEK MEDICAL CARE IF:   You are having difficultly using the spirometer.  You have trouble using the spirometer as often as instructed.  Your pain medication is not giving enough relief while using the spirometer.  You develop fever of 100.5 F (38.1 C) or higher. SEEK IMMEDIATE MEDICAL CARE IF:   You cough up bloody sputum that had not been present before.  You develop fever of 102 F (38.9 C) or greater.  You develop worsening pain at or near the incision site. MAKE SURE YOU:   Understand these instructions.  Will watch your condition.  Will get help right away if you are not doing well or get worse. Document Released: 09/25/2006 Document Revised: 08/07/2011 Document Reviewed: 11/26/2006 [facility] Patient Information 2014 [place], [place].   ________________________________________________________________________

## 2019-05-20 ENCOUNTER — Encounter (HOSPITAL_COMMUNITY): Payer: Self-pay

## 2019-05-20 ENCOUNTER — Encounter (HOSPITAL_COMMUNITY)
Admission: RE | Admit: 2019-05-20 | Discharge: 2019-05-20 | Disposition: A | Discharge status: Home / Self Care | Payer: Managed Care, Other (non HMO) | Source: Ambulatory Visit | Attending: Orthopedic Surgery | Admitting: Orthopedic Surgery

## 2019-05-20 ENCOUNTER — Other Ambulatory Visit: Payer: Self-pay

## 2019-05-20 DIAGNOSIS — Z01812 Encounter for preprocedural laboratory examination: Secondary | ICD-10-CM | POA: Diagnosis not present

## 2019-05-20 HISTORY — DX: Cardiac arrhythmia, unspecified: I49.9

## 2019-05-20 HISTORY — DX: Pneumonia, unspecified organism: J18.9

## 2019-05-20 HISTORY — DX: Chronic kidney disease, unspecified: N18.9

## 2019-05-20 NOTE — Progress Notes (Addendum)
PCP - Dr. Donnetta Hutching Cardiologist - Cecilie Kicks cardiac NP: 05/01/2019.EPIC Nephrologist: Dr. Wyatt Haste LOV 04/08/19. Epic  Chest x-ray - 07/23/18. EPIC EKG - 04/07/2019. EPIC Stress Test -  ECHO - 06/14/2018. Epic. Cardiac Cath - 06/12/18. EPIC  Sleep Study -  CPAP -   Fasting Blood Sugar - 90-110 Checks Blood Sugar _every morning_  Blood Thinner Instructions:By Dr. Lyla Glassing Aspirin Instructions:Stop on 05/21/19 Last Dose:  Anesthesia review: CABG on 06/14/2018.  Patient denies shortness of breath, fever, cough and chest pain at PAT appointment   Patient verbalized understanding of instructions that were given to them at the PAT appointment. Patient was also instructed that they will need to review over the PAT instructions again at home before surgery.

## 2019-05-21 ENCOUNTER — Encounter (HOSPITAL_COMMUNITY)
Admission: RE | Admit: 2019-05-21 | Discharge: 2019-05-21 | Disposition: A | Discharge status: Home / Self Care | Payer: Managed Care, Other (non HMO) | Source: Ambulatory Visit | Attending: Orthopedic Surgery | Admitting: Orthopedic Surgery

## 2019-05-21 DIAGNOSIS — Z01812 Encounter for preprocedural laboratory examination: Secondary | ICD-10-CM | POA: Diagnosis not present

## 2019-05-21 LAB — URINALYSIS, ROUTINE W REFLEX MICROSCOPIC
Bilirubin Urine: NEGATIVE
Glucose, UA: NEGATIVE mg/dL
Hgb urine dipstick: NEGATIVE
Ketones, ur: 5 mg/dL — AB
Leukocytes,Ua: NEGATIVE
Nitrite: NEGATIVE
Protein, ur: 30 mg/dL — AB
Specific Gravity, Urine: 1.021 (ref 1.005–1.030)
pH: 5 (ref 5.0–8.0)

## 2019-05-21 LAB — COMPREHENSIVE METABOLIC PANEL
ALT: 18 U/L (ref 0–44)
AST: 25 U/L (ref 15–41)
Albumin: 4.5 g/dL (ref 3.5–5.0)
Alkaline Phosphatase: 71 U/L (ref 38–126)
Anion gap: 14 (ref 5–15)
BUN: 27 mg/dL — ABNORMAL HIGH (ref 8–23)
CO2: 19 mmol/L — ABNORMAL LOW (ref 22–32)
Calcium: 9.1 mg/dL (ref 8.9–10.3)
Chloride: 108 mmol/L (ref 98–111)
Creatinine, Ser: 1.62 mg/dL — ABNORMAL HIGH (ref 0.44–1.00)
GFR calc Af Amer: 38 mL/min — ABNORMAL LOW (ref >60)
GFR calc non Af Amer: 33 mL/min — ABNORMAL LOW (ref >60)
Glucose, Bld: 107 mg/dL — ABNORMAL HIGH (ref 70–99)
Potassium: 4.6 mmol/L (ref 3.5–5.1)
Sodium: 141 mmol/L (ref 135–145)
Total Bilirubin: 0.3 mg/dL (ref 0.3–1.2)
Total Protein: 7.2 g/dL (ref 6.5–8.1)

## 2019-05-21 LAB — CBC
HCT: 36.8 % (ref 36.0–46.0)
Hemoglobin: 11.2 g/dL — ABNORMAL LOW (ref 12.0–15.0)
MCH: 28 pg (ref 26.0–34.0)
MCHC: 30.4 g/dL (ref 30.0–36.0)
MCV: 92 fL (ref 80.0–100.0)
Platelets: 252 10*3/uL (ref 150–400)
RBC: 4 MIL/uL (ref 3.87–5.11)
RDW: 13.6 % (ref 11.5–15.5)
WBC: 6.9 10*3/uL (ref 4.0–10.5)
nRBC: 0 % (ref 0.0–0.2)

## 2019-05-21 LAB — SURGICAL PCR SCREEN
MRSA, PCR: NEGATIVE
Staphylococcus aureus: NEGATIVE

## 2019-05-21 LAB — PROTIME-INR
INR: 1.1 (ref 0.8–1.2)
Prothrombin Time: 13.9 seconds (ref 11.4–15.2)

## 2019-05-22 IMAGING — CR DG CHEST 2V
2 series · 2 of 2 positions shown · non-contrast
Comparison: 06/20/2018.

CLINICAL DATA: CABG.

EXAM:
CHEST - 2 VIEW

[w chest pa]
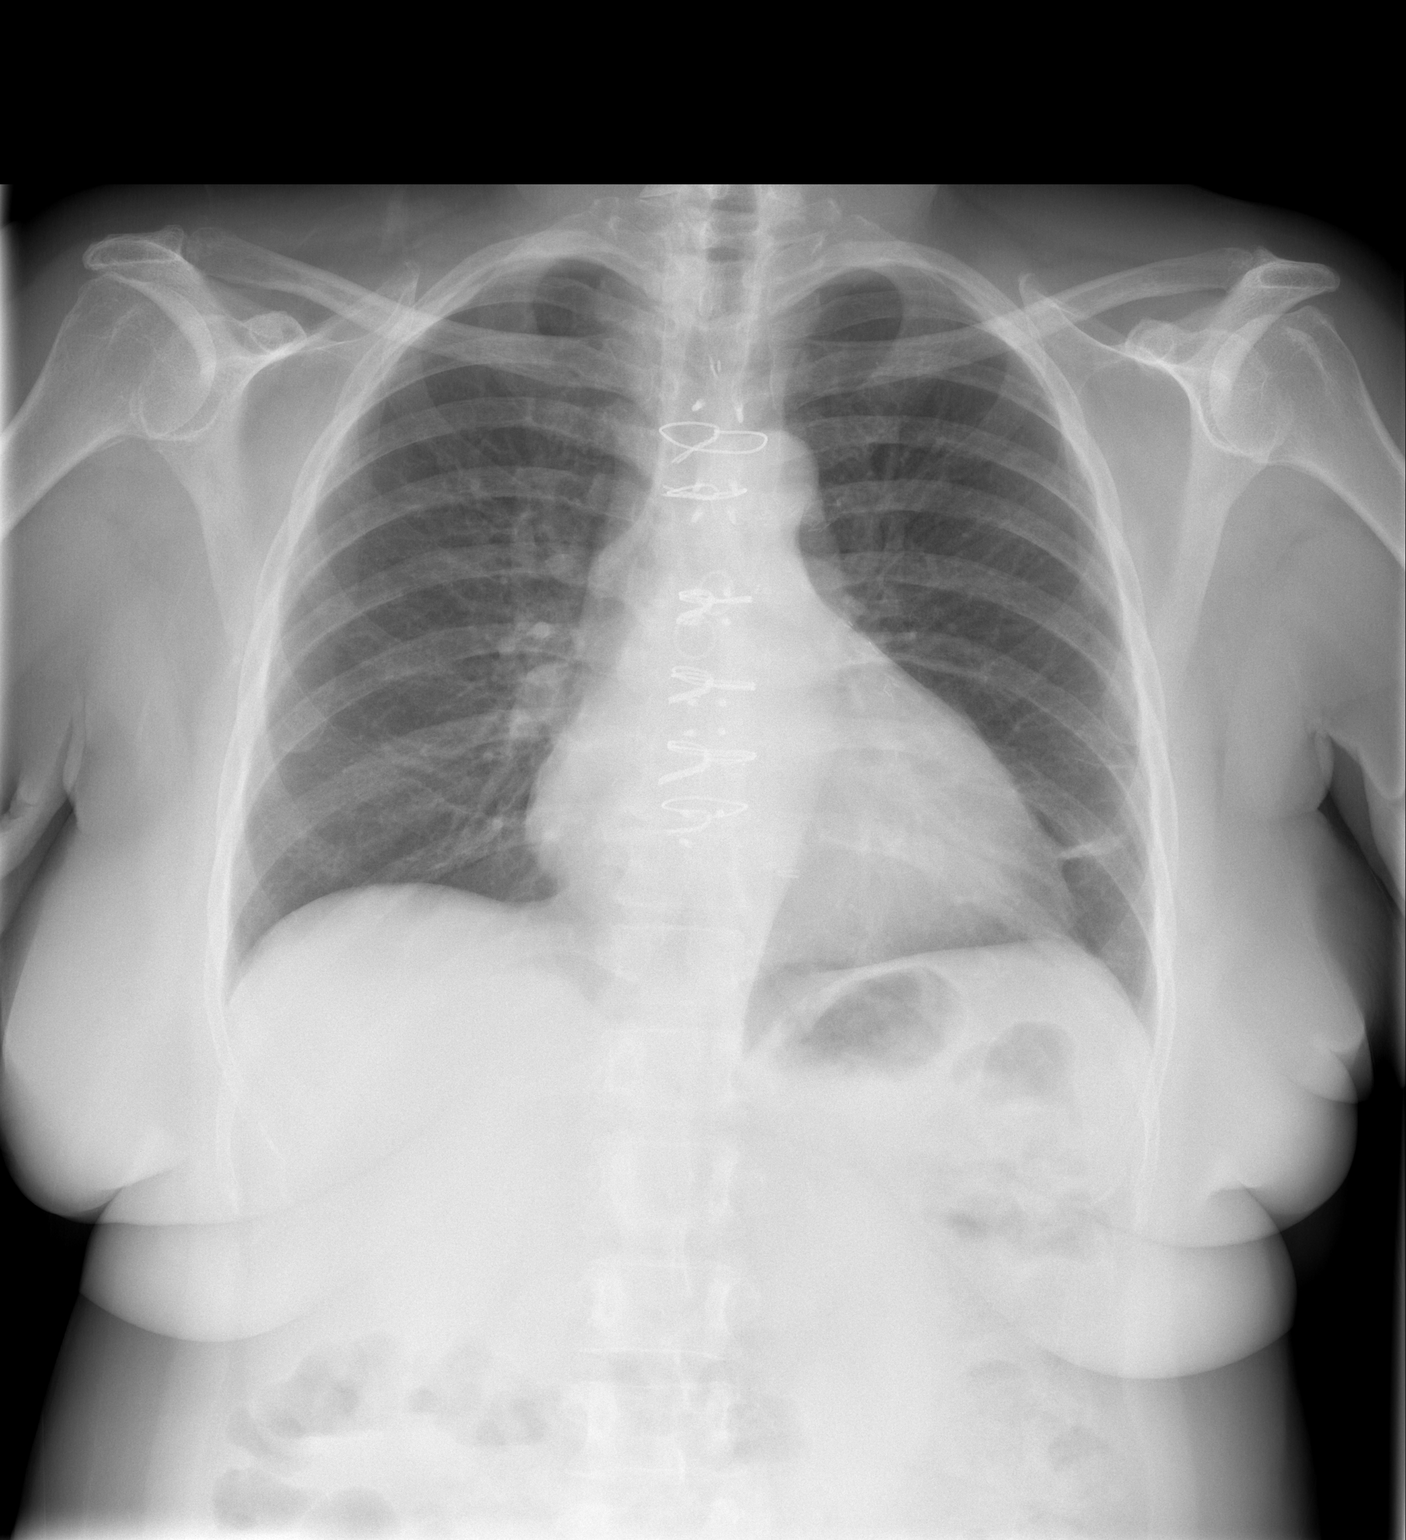

[w chest lat]
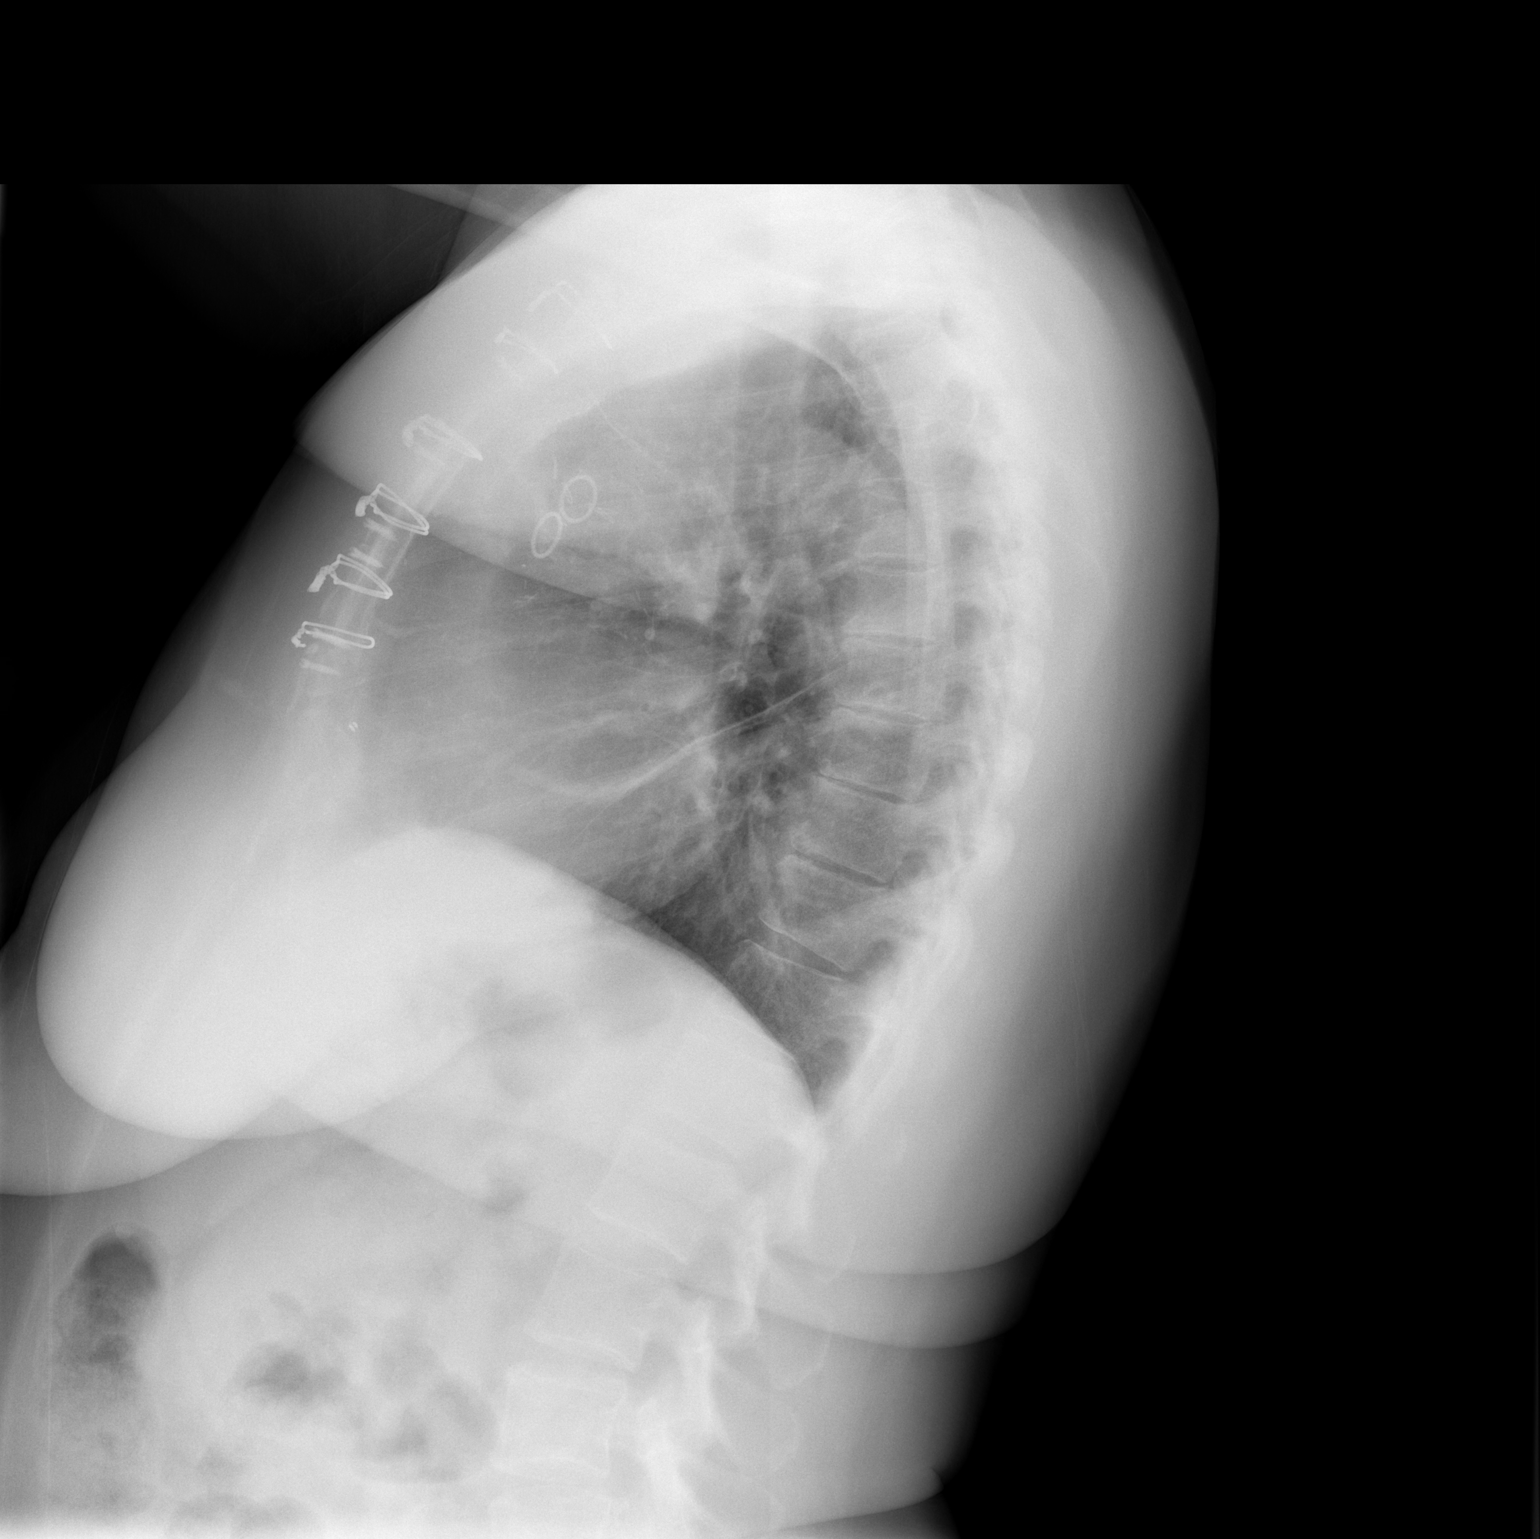

[2 of 2 positions shown; findings below may reference images not displayed]

FINDINGS: Mediastinum and hilar structures normal. Mild left base subsegmental
atelectasis. Prior CABG. Heart size normal. No acute bony
abnormality.
IMPRESSION: 1.  Mild left base subsegmental atelectasis.

2.  Prior CABG.  Heart size normal.

## 2019-05-24 ENCOUNTER — Other Ambulatory Visit (HOSPITAL_COMMUNITY)
Admission: RE | Admit: 2019-05-24 | Discharge: 2019-05-24 | Disposition: A | Discharge status: Home / Self Care | Payer: Managed Care, Other (non HMO) | Source: Ambulatory Visit | Attending: Orthopedic Surgery | Admitting: Orthopedic Surgery

## 2019-05-24 DIAGNOSIS — Z20828 Contact with and (suspected) exposure to other viral communicable diseases: Secondary | ICD-10-CM | POA: Insufficient documentation

## 2019-05-24 DIAGNOSIS — Z01812 Encounter for preprocedural laboratory examination: Secondary | ICD-10-CM | POA: Insufficient documentation

## 2019-05-25 LAB — NOVEL CORONAVIRUS, NAA (HOSP ORDER, SEND-OUT TO REF LAB; TAT 18-24 HRS): SARS-CoV-2, NAA: NOT DETECTED

## 2019-05-26 ENCOUNTER — Ambulatory Visit: Payer: Self-pay | Admitting: Orthopedic Surgery

## 2019-05-26 NOTE — Progress Notes (Signed)
Anesthesia Chart Review   Case: [id number] Date/Time: 05/28/19 0715   Procedure: COMPUTER ASSISTED TOTAL KNEE ARTHROPLASTY (Right Knee)   Anesthesia type: Spinal   Pre-op diagnosis: Degnerative joint disease right knee   Location: [facility] ROOM 07 / [facility]   Surgeons: Samson Frederic, MD      DISCUSSION:63 y.o. former smoker (quit 02/26/01) with h/o HTN, HLD, GERD, DM II, CAD (CABG 05/2018, experienced post operative a-fib), CKD (creatinine stable), right knee djd scheduled for above procedure 05/28/2019 with Dr. Samson Frederic.   Pt last seen by cardiology 05/01/2019.  Per OV note, "Pre-op for Rt total knee - CABG I/17/20 and has been stable without angina since.  0.9% risk of Major cardiac event.  She may hold ASA for 5 days prior and resume post op, I will check with Dr. Anne Fu to confirm ASA."  Anticipate pt can proceed with planned procedure barring acute status change.   VS: BP (!) 175/81   Pulse 81   Temp 36.7 C (Oral)   Resp 18   Ht 5\' 1"  (1.549 m)   Wt 65.6 kg   SpO2 100%   BMI 27.32 kg/m   PROVIDERS: [place], , MD is PCP   Myrene Galas, MD is Nephrologist last seen 04/08/2019  13/02/2019, MD is Cardiologist  LABS: Labs reviewed: Acceptable for surgery. (all labs ordered are listed, but only abnormal results are displayed)  Labs Reviewed  CBC - Abnormal; Notable for the following components:      Result Value   Hemoglobin 11.2 (*)    All other components within normal limits  COMPREHENSIVE METABOLIC PANEL - Abnormal; Notable for the following components:   CO2 19 (*)    Glucose, Bld 107 (*)    BUN 27 (*)    Creatinine, Ser 1.62 (*)    GFR calc non Af Amer 33 (*)    GFR calc Af Amer 38 (*)    All other components within normal limits  URINALYSIS, ROUTINE W REFLEX MICROSCOPIC - Abnormal; Notable for the following components:   Ketones, ur 5 (*)    Protein, ur 30 (*)    Bacteria, UA RARE (*)    All other components within normal limits  SURGICAL PCR  SCREEN  PROTIME-INR  TYPE AND SCREEN     IMAGES:   EKG: Rate 04/07/2019 Rate 73 bpm Sinus rhythm  Poor R wave progression  Cannot rule out anterior infarct, age undetermined Clinical correlation required  CV: Echo 06/14/2018 Normal cavity size and wall thickness. LV systolic function is low normal with an EF of 50-55%.  There are no obvious wall motion abnormalities. No thrombus present. No mass present.  Septum: No Patent Foramen Ovale present.  Left atrium: Patent foramen ovale not present.  Aortic valve: No AV vegetation.  Mitral valve: No leaflet thickening and calcification present. Trace regurgitation.  Right ventricle: Normal cavity size, wall thickness and ejection fraction. No thrombus present. No mass present.  Pulmonic valve: Trace regurgitation. Past Medical History:  Diagnosis Date  . Anemia   . Anxiety   . Arthritis   . CAD (coronary artery disease)    S/P CABG 05/2018  . Chronic kidney disease   . Diabetes mellitus without complication (HCC)    type II  . Dysrhythmia    post CABG  . Female bladder prolapse   . GERD (gastroesophageal reflux disease)   . Headache   . History of bronchitis   . History of pneumonia   . Hyperlipidemia   .  Hypertension   . Joint inflammation   . Pneumonia     Past Surgical History:  Procedure Laterality Date  . ABDOMINAL HYSTERECTOMY  1992  . COLONOSCOPY    . CORONARY ARTERY BYPASS GRAFT N/A 06/14/2018   Procedure: CORONARY ARTERY BYPASS GRAFTING (CABG) x4  using left internal mammary artery and right greater saphenous vein harvested endoscopically.;  Surgeon: Melrose Nakayama, MD;  Location: [place];  Service: Open Heart Surgery;  Laterality: N/A;  . INCISION AND DRAINAGE HIP Left 09/04/2014   Procedure: IRRIGATION AND DEBRIDEMENT  LEFT HIP WOUND WITH CLOSURE ;  Surgeon: Rod Can, MD;  Location: [place];  Service: Orthopedics;  Laterality: Left;  . kidney stones    . LEFT HEART CATH AND CORONARY ANGIOGRAPHY  N/A 06/12/2018   Procedure: LEFT HEART CATH AND CORONARY ANGIOGRAPHY;  Surgeon: Troy Sine, MD;  Location: [place] [facility];  Service: Cardiovascular;  Laterality: N/A;  . LUMBAR LAMINECTOMY/DECOMPRESSION MICRODISCECTOMY  03/11/2015   Procedure: Lumbar Four-Five DECOMPRESSION  AND REMOVAL SYNOVAL CYST;  Surgeon: Melina Schools, MD;  Location: [place];  Service: Orthopedics;;  . TEE WITHOUT CARDIOVERSION N/A 06/14/2018   Procedure: TRANSESOPHAGEAL ECHOCARDIOGRAM (TEE);  Surgeon: Melrose Nakayama, MD;  Location: [place];  Service: Open Heart Surgery;  Laterality: N/A;  . TOTAL HIP ARTHROPLASTY Left 07/30/2014   Procedure: LEFT TOTAL HIP ARTHROPLASTY ANTERIOR APPROACH;  Surgeon: Elie Goody, MD;  [facility];  Service: Orthopedics;  Laterality: Left;  . TUBAL LIGATION    . ULTRASOUND GUIDANCE FOR VASCULAR ACCESS  06/12/2018   Procedure: Ultrasound Guidance For Vascular Access;  Surgeon: Troy Sine, MD;  Location: [place] [facility];  Service: Cardiovascular;;    MEDICATIONS: . aspirin 81 MG EC tablet  . buPROPion (WELLBUTRIN) 100 MG tablet  . Evolocumab (REPATHA SURECLICK) 892 MG/ML SOAJ  . ezetimibe (ZETIA) 10 MG tablet  . fluticasone (FLONASE) 50 MCG/ACT nasal spray  . folic acid (FOLVITE) 1 MG tablet  . loratadine (CLARITIN) 10 MG tablet  . metoprolol tartrate (LOPRESSOR) 50 MG tablet  . Omega-3 Fatty Acids (FISH OIL) 1200 MG CAPS  . omeprazole (PRILOSEC) 40 MG capsule  . sitaGLIPtin-metformin (JANUMET) 50-1000 MG per tablet  . sodium bicarbonate 650 MG tablet  . traMADol (ULTRAM) 50 MG tablet   No current facility-administered medications for this encounter.   Marland Kitchen dexamethasone (DECADRON) injection 4 mg  . ondansetron (ZOFRAN) 4 mg in sodium chloride 0.9 % 50 mL IVPB   Maia Plan [facility] Pre-Surgical Testing [phone] 05/26/19  1:18 PM

## 2019-05-26 NOTE — Anesthesia Preprocedure Evaluation (Addendum)
Anesthesia Evaluation  Patient identified by MRN, date of birth, ID band Patient awake    Reviewed: Allergy & Precautions, NPO status , Patient's Chart, lab work & pertinent test results  Airway Mallampati: II  TM Distance: >3 FB Neck ROM: Full    Dental no notable dental hx. (+) Teeth Intact   Pulmonary former smoker,    Pulmonary exam normal breath sounds clear to auscultation       Cardiovascular hypertension, Pt. on home beta blockers and Pt. on medications + angina + CAD and + CABG  Normal cardiovascular exam Rhythm:Regular Rate:Normal     Neuro/Psych  Headaches, Anxiety    GI/Hepatic Neg liver ROS, GERD  Medicated,  Endo/Other  diabetes, Well Controlled, Type 2, Oral Hypoglycemic Agents  Renal/GU Renal InsufficiencyRenal diseaseK+ 4.6 Cr 1.62     Musculoskeletal negative musculoskeletal ROS (+)   Abdominal   Peds  Hematology negative hematology ROS (+) hgb 11.2   Anesthesia Other Findings   Reproductive/Obstetrics                           Anesthesia Physical Anesthesia Plan  ASA: III  Anesthesia Plan: Spinal   Post-op Pain Management:  Regional for Post-op pain   Induction:   PONV Risk Score and Plan: Treatment may vary due to age or medical condition  Airway Management Planned: Nasal Cannula and Natural Airway  Additional Equipment: None  Intra-op Plan:   Post-operative Plan:   Informed Consent: I have reviewed the patients History and Physical, chart, labs and discussed the procedure including the risks, benefits and alternatives for the proposed anesthesia with the patient or authorized representative who has indicated his/her understanding and acceptance.     Dental advisory given  Plan Discussed with: CRNA  Anesthesia Plan Comments: (See PAT note 05/21/2019, Konrad Felix, PA-C  Spinal w R Adductor canal)      Anesthesia Quick Evaluation

## 2019-05-26 NOTE — H&P (View-Only) (Signed)
TOTAL KNEE ADMISSION H&P  Patient is being admitted for right total knee arthroplasty.  Subjective:  Chief Complaint:right knee pain.  HPI: Cassidy Green, 63 y.o. female, has a history of pain and functional disability in the right knee due to arthritis and has failed non-surgical conservative treatments for greater than 12 weeks to includeNSAID's and/or analgesics, corticosteriod injections, viscosupplementation injections, flexibility and strengthening excercises, use of assistive devices, weight reduction as appropriate and activity modification.  Onset of symptoms was gradual, starting 5 years ago with gradually worsening course since that time. The patient noted no past surgery on the right knee(s).  Patient currently rates pain in the right knee(s) at 10 out of 10 with activity. Patient has night pain, worsening of pain with activity and weight bearing, pain that interferes with activities of daily living, pain with passive range of motion, crepitus and joint swelling.  Patient has evidence of subchondral cysts, subchondral sclerosis, periarticular osteophytes and joint space narrowing by imaging studies. There is no active infection.  Patient Active Problem List   Diagnosis Date Noted  . Unstable angina ([place]) 06/12/2018  . Dyslipidemia (high LDL; low HDL)   . Hypertriglyceridemia   . CKD stage 3 due to type 2 diabetes mellitus ([place])   . Abnormal nuclear stress test   . History of pancreatitis   . Back pain 03/12/2015  . Spinal stenosis at L4-L5 level 03/11/2015  . Postoperative stitch abscess 09/04/2014  . Degenerative joint disease of left hip 07/30/2014  . Primary osteoarthritis of left hip 07/30/2014   Past Medical History:  Diagnosis Date  . Anemia   . Anxiety   . Arthritis   . CAD (coronary artery disease)    S/P CABG 05/2018  . Chronic kidney disease   . Diabetes mellitus without complication ([place])    type II  . Dysrhythmia    post CABG  . Female bladder prolapse   .  GERD (gastroesophageal reflux disease)   . Headache   . History of bronchitis   . History of pneumonia   . Hyperlipidemia   . Hypertension   . Joint inflammation   . Pneumonia     Past Surgical History:  Procedure Laterality Date  . ABDOMINAL HYSTERECTOMY  1992  . COLONOSCOPY    . CORONARY ARTERY BYPASS GRAFT N/A 06/14/2018   Procedure: CORONARY ARTERY BYPASS GRAFTING (CABG) x4  using left internal mammary artery and right greater saphenous vein harvested endoscopically.;  Surgeon: Melrose Nakayama, MD;  Location: [place];  Service: Open Heart Surgery;  Laterality: N/A;  . INCISION AND DRAINAGE HIP Left 09/04/2014   Procedure: IRRIGATION AND DEBRIDEMENT  LEFT HIP WOUND WITH CLOSURE ;  Surgeon: Rod Can, MD;  Location: [place];  Service: Orthopedics;  Laterality: Left;  . kidney stones    . LEFT HEART CATH AND CORONARY ANGIOGRAPHY N/A 06/12/2018   Procedure: LEFT HEART CATH AND CORONARY ANGIOGRAPHY;  Surgeon: Troy Sine, MD;  Location: [place] [facility];  Service: Cardiovascular;  Laterality: N/A;  . LUMBAR LAMINECTOMY/DECOMPRESSION MICRODISCECTOMY  03/11/2015   Procedure: Lumbar Four-Five DECOMPRESSION  AND REMOVAL SYNOVAL CYST;  Surgeon: Melina Schools, MD;  Location: [place];  Service: Orthopedics;;  . TEE WITHOUT CARDIOVERSION N/A 06/14/2018   Procedure: TRANSESOPHAGEAL ECHOCARDIOGRAM (TEE);  Surgeon: Melrose Nakayama, MD;  Location: [place];  Service: Open Heart Surgery;  Laterality: N/A;  . TOTAL HIP ARTHROPLASTY Left 07/30/2014   Procedure: LEFT TOTAL HIP ARTHROPLASTY ANTERIOR APPROACH;  Surgeon: Elie Goody, MD;  [facility];  Service: Orthopedics;  Laterality: Left;  . TUBAL LIGATION    . ULTRASOUND GUIDANCE FOR VASCULAR ACCESS  06/12/2018   Procedure: Ultrasound Guidance For Vascular Access;  Surgeon: Kelly, Thomas A, MD;  [facility];  Service: Cardiovascular;;    Current Outpatient Medications  Medication Sig Dispense Refill Last Dose  .  aspirin 81 MG EC tablet Take 1 tablet (81 mg total) by mouth daily. (Patient taking differently: Take 81 mg by mouth every evening. )     . buPROPion (WELLBUTRIN) 100 MG tablet Take 100 mg by mouth daily as needed (anxiety).      . Evolocumab (REPATHA SURECLICK) 140 MG/ML SOAJ Inject 140 mg into the skin every 14 (fourteen) days. 2 pen 11   . ezetimibe (ZETIA) 10 MG tablet Take 1 tablet (10 mg total) by mouth at bedtime. (Patient taking differently: Take 10 mg by mouth every evening. ) 90 tablet 3   . fluticasone (FLONASE) 50 MCG/ACT nasal spray Place 1 spray into both nostrils daily.     . folic acid (FOLVITE) 1 MG tablet Take 1 mg by mouth every evening.      . loratadine (CLARITIN) 10 MG tablet Take 1 tablet (10 mg total) by mouth daily.     . metoprolol tartrate (LOPRESSOR) 50 MG tablet Take 1.5 tablets (75 mg total) by mouth 2 (two) times daily. (Patient taking differently: Take 50 mg by mouth 2 (two) times daily. ) 270 tablet 3   . Omega-3 Fatty Acids (FISH OIL) 1200 MG CAPS Take 1,200 mg by mouth daily.     . omeprazole (PRILOSEC) 40 MG capsule Take 1 capsule (40 mg total) by mouth daily. (Patient taking differently: Take 40 mg by mouth every evening. ) 30 capsule 11   . sitaGLIPtin-metformin (JANUMET) 50-1000 MG per tablet Take 1 tablet by mouth 2 (two) times daily with a meal.      . sodium bicarbonate 650 MG tablet Take 650 mg by mouth 2 (two) times daily.     . traMADol (ULTRAM) 50 MG tablet Take 1 tablet (50 mg total) by mouth every 6 (six) hours as needed. (Patient taking differently: Take 50 mg by mouth every 6 (six) hours as needed for moderate pain. ) 20 tablet 0    No current facility-administered medications for this visit.   Facility-Administered Medications Ordered in Other Visits  Medication Dose Route Frequency Provider Last Rate Last Admin  . dexamethasone (DECADRON) injection 4 mg  4 mg Intravenous Once Janeice Stegall, MD      . ondansetron (ZOFRAN) 4 mg in sodium chloride  0.9 % 50 mL IVPB  4 mg Intravenous Once Kemyra August, MD       Allergies  Allergen Reactions  . Statins Nausea And Vomiting and Other (See Comments)     " I cant recall the name of the statin, but I had joint pain with one of the statins "  . Nsaids Other (See Comments)    CHRONIC KIDNEY DISEASE    Social History   Tobacco Use  . Smoking status: Former Smoker    Quit date: 02/26/2001    Years since quitting: 18.2  . Smokeless tobacco: Never Used  Substance Use Topics  . Alcohol use: Yes    Comment: OCCASIONAL    No family history on file.   Review of Systems  Constitutional: Negative.   HENT: Negative.   Eyes: Negative.   Respiratory: Negative.   Cardiovascular: Negative.     Gastrointestinal: Negative.   Endocrine: Negative.   Genitourinary: Negative.   Musculoskeletal: Positive for arthralgias, back pain and joint swelling.  Skin: Negative.   Allergic/Immunologic: Negative.   Neurological: Negative.   Hematological: Negative.   Psychiatric/Behavioral: Negative.     Objective:  Physical Exam  Vitals reviewed. Constitutional: She is oriented to person, place, and time. She appears well-developed and well-nourished.  HENT:  Head: Normocephalic and atraumatic.  Eyes: Pupils are equal, round, and reactive to light. Conjunctivae and EOM are normal.  Neck: No thyromegaly present.  Cardiovascular: Normal rate, regular rhythm and intact distal pulses.  Respiratory: Effort normal. No respiratory distress.  GI: Soft. She exhibits no distension.  Genitourinary:    Genitourinary Comments: deferred   Musculoskeletal:     Cervical back: Normal range of motion.     Right knee: Swelling and effusion present. Decreased range of motion. Tenderness present over the medial joint line and lateral joint line. Abnormal alignment.  Neurological: She is alert and oriented to person, place, and time. She has normal reflexes.  Skin: Skin is warm and dry.  Psychiatric: She has a normal  mood and affect. Her behavior is normal. Judgment and thought content normal.    Vital signs in last 24 hours: @VSRANGES @  Labs:   Estimated body mass index is 27.32 kg/m as calculated from the following:   Height as of 05/21/19: 5\' 1"  (1.549 m).   Weight as of 05/21/19: 65.6 kg.   Imaging Review Plain radiographs demonstrate severe degenerative joint disease of the right knee(s). The overall alignment issignificant valgus. The bone quality appears to be adequate for age and reported activity level.      Assessment/Plan:  End stage arthritis, right knee   The patient history, physical examination, clinical judgment of the provider and imaging studies are consistent with end stage degenerative joint disease of the right knee(s) and total knee arthroplasty is deemed medically necessary. The treatment options including medical management, injection therapy arthroscopy and arthroplasty were discussed at length. The risks and benefits of total knee arthroplasty were presented and reviewed. The risks due to aseptic loosening, infection, stiffness, patella tracking problems, thromboembolic complications and other imponderables were discussed. The patient acknowledged the explanation, agreed to proceed with the plan and consent was signed. Patient is being admitted for inpatient treatment for surgery, pain control, PT, OT, prophylactic antibiotics, VTE prophylaxis, progressive ambulation and ADL's and discharge planning. The patient is planning to be discharged home with OPPT    Anticipated LOS equal to or greater than 2 midnights due to - Age 45 and older with one or more of the following:  - Obesity  - Expected need for hospital services (PT, OT, Nursing) required for safe  discharge  - Anticipated need for postoperative skilled nursing care or inpatient rehab  - Active co-morbidities: Diabetes and Coronary Artery Disease OR   - Unanticipated findings during/Post Surgery: None  -  Patient is a high risk of re-admission due to: None

## 2019-05-26 NOTE — H&P (Signed)
TOTAL KNEE ADMISSION H&P  Patient is being admitted for right total knee arthroplasty.  Subjective:  Chief Complaint:right knee pain.  HPI: Cassidy Green, 63 y.o. female, has a history of pain and functional disability in the right knee due to arthritis and has failed non-surgical conservative treatments for greater than 12 weeks to includeNSAID's and/or analgesics, corticosteriod injections, viscosupplementation injections, flexibility and strengthening excercises, use of assistive devices, weight reduction as appropriate and activity modification.  Onset of symptoms was gradual, starting 5 years ago with gradually worsening course since that time. The patient noted no past surgery on the right knee(s).  Patient currently rates pain in the right knee(s) at 10 out of 10 with activity. Patient has night pain, worsening of pain with activity and weight bearing, pain that interferes with activities of daily living, pain with passive range of motion, crepitus and joint swelling.  Patient has evidence of subchondral cysts, subchondral sclerosis, periarticular osteophytes and joint space narrowing by imaging studies. There is no active infection.  Patient Active Problem List   Diagnosis Date Noted  . Unstable angina ([place]) 06/12/2018  . Dyslipidemia (high LDL; low HDL)   . Hypertriglyceridemia   . CKD stage 3 due to type 2 diabetes mellitus ([place])   . Abnormal nuclear stress test   . History of pancreatitis   . Back pain 03/12/2015  . Spinal stenosis at L4-L5 level 03/11/2015  . Postoperative stitch abscess 09/04/2014  . Degenerative joint disease of left hip 07/30/2014  . Primary osteoarthritis of left hip 07/30/2014   Past Medical History:  Diagnosis Date  . Anemia   . Anxiety   . Arthritis   . CAD (coronary artery disease)    S/P CABG 05/2018  . Chronic kidney disease   . Diabetes mellitus without complication ([place])    type II  . Dysrhythmia    post CABG  . Female bladder prolapse   .  GERD (gastroesophageal reflux disease)   . Headache   . History of bronchitis   . History of pneumonia   . Hyperlipidemia   . Hypertension   . Joint inflammation   . Pneumonia     Past Surgical History:  Procedure Laterality Date  . ABDOMINAL HYSTERECTOMY  1992  . COLONOSCOPY    . CORONARY ARTERY BYPASS GRAFT N/A 06/14/2018   Procedure: CORONARY ARTERY BYPASS GRAFTING (CABG) x4  using left internal mammary artery and right greater saphenous vein harvested endoscopically.;  Surgeon: Melrose Nakayama, MD;  Location: [facility];  Service: Open Heart Surgery;  Laterality: N/A;  . INCISION AND DRAINAGE HIP Left 09/04/2014   Procedure: IRRIGATION AND DEBRIDEMENT  LEFT HIP WOUND WITH CLOSURE ;  Surgeon: Rod Can, MD;  Location: [facility];  Service: Orthopedics;  Laterality: Left;  . kidney stones    . LEFT HEART CATH AND CORONARY ANGIOGRAPHY N/A 06/12/2018   Procedure: LEFT HEART CATH AND CORONARY ANGIOGRAPHY;  Surgeon: Troy Sine, MD;  Location: [facility];  Service: Cardiovascular;  Laterality: N/A;  . LUMBAR LAMINECTOMY/DECOMPRESSION MICRODISCECTOMY  03/11/2015   Procedure: Lumbar Four-Five DECOMPRESSION  AND REMOVAL SYNOVAL CYST;  Surgeon: Melina Schools, MD;  Location: [facility];  Service: Orthopedics;;  . TEE WITHOUT CARDIOVERSION N/A 06/14/2018   Procedure: TRANSESOPHAGEAL ECHOCARDIOGRAM (TEE);  Surgeon: Melrose Nakayama, MD;  Location: [facility];  Service: Open Heart Surgery;  Laterality: N/A;  . TOTAL HIP ARTHROPLASTY Left 07/30/2014   Procedure: LEFT TOTAL HIP ARTHROPLASTY ANTERIOR APPROACH;  Surgeon: Elie Goody, MD;  Location: [facility];  Service: Orthopedics;  Laterality: Left;  . TUBAL LIGATION    . ULTRASOUND GUIDANCE FOR VASCULAR ACCESS  06/12/2018   Procedure: Ultrasound Guidance For Vascular Access;  Surgeon: Lennette BihariKelly, Thomas A, MD;  [facility];  Service: Cardiovascular;;    Current Outpatient Medications  Medication Sig Dispense Refill Last Dose  .  aspirin 81 MG EC tablet Take 1 tablet (81 mg total) by mouth daily. (Patient taking differently: Take 81 mg by mouth every evening. )     . buPROPion (WELLBUTRIN) 100 MG tablet Take 100 mg by mouth daily as needed (anxiety).      . Evolocumab (REPATHA SURECLICK) 140 MG/ML SOAJ Inject 140 mg into the skin every 14 (fourteen) days. 2 pen 11   . ezetimibe (ZETIA) 10 MG tablet Take 1 tablet (10 mg total) by mouth at bedtime. (Patient taking differently: Take 10 mg by mouth every evening. ) 90 tablet 3   . fluticasone (FLONASE) 50 MCG/ACT nasal spray Place 1 spray into both nostrils daily.     . folic acid (FOLVITE) 1 MG tablet Take 1 mg by mouth every evening.      . loratadine (CLARITIN) 10 MG tablet Take 1 tablet (10 mg total) by mouth daily.     . metoprolol tartrate (LOPRESSOR) 50 MG tablet Take 1.5 tablets (75 mg total) by mouth 2 (two) times daily. (Patient taking differently: Take 50 mg by mouth 2 (two) times daily. ) 270 tablet 3   . Omega-3 Fatty Acids (FISH OIL) 1200 MG CAPS Take 1,200 mg by mouth daily.     Marland Kitchen. omeprazole (PRILOSEC) 40 MG capsule Take 1 capsule (40 mg total) by mouth daily. (Patient taking differently: Take 40 mg by mouth every evening. ) 30 capsule 11   . sitaGLIPtin-metformin (JANUMET) 50-1000 MG per tablet Take 1 tablet by mouth 2 (two) times daily with a meal.      . sodium bicarbonate 650 MG tablet Take 650 mg by mouth 2 (two) times daily.     . traMADol (ULTRAM) 50 MG tablet Take 1 tablet (50 mg total) by mouth every 6 (six) hours as needed. (Patient taking differently: Take 50 mg by mouth every 6 (six) hours as needed for moderate pain. ) 20 tablet 0    No current facility-administered medications for this visit.   Facility-Administered Medications Ordered in Other Visits  Medication Dose Route Frequency Provider Last Rate Last Admin  . dexamethasone (DECADRON) injection 4 mg  4 mg Intravenous Once Samson FredericSwinteck, Ikechukwu Cerny, MD      . ondansetron (ZOFRAN) 4 mg in sodium chloride  0.9 % 50 mL IVPB  4 mg Intravenous Once Samson FredericSwinteck, Shalaine Payson, MD       Allergies  Allergen Reactions  . Statins Nausea And Vomiting and Other (See Comments)     " I cant recall the name of the statin, but I had joint pain with one of the statins "  . Nsaids Other (See Comments)    CHRONIC KIDNEY DISEASE    Social History   Tobacco Use  . Smoking status: Former Smoker    Quit date: 02/26/2001    Years since quitting: 18.2  . Smokeless tobacco: Never Used  Substance Use Topics  . Alcohol use: Yes    Comment: OCCASIONAL    No family history on file.   Review of Systems  Constitutional: Negative.   HENT: Negative.   Eyes: Negative.   Respiratory: Negative.   Cardiovascular: Negative.  Gastrointestinal: Negative.   Endocrine: Negative.   Genitourinary: Negative.   Musculoskeletal: Positive for arthralgias, back pain and joint swelling.  Skin: Negative.   Allergic/Immunologic: Negative.   Neurological: Negative.   Hematological: Negative.   Psychiatric/Behavioral: Negative.     Objective:  Physical Exam  Vitals reviewed. Constitutional: She is oriented to person, place, and time. She appears well-developed and well-nourished.  HENT:  Head: Normocephalic and atraumatic.  Eyes: Pupils are equal, round, and reactive to light. Conjunctivae and EOM are normal.  Neck: No thyromegaly present.  Cardiovascular: Normal rate, regular rhythm and intact distal pulses.  Respiratory: Effort normal. No respiratory distress.  GI: Soft. She exhibits no distension.  Genitourinary:    Genitourinary Comments: deferred   Musculoskeletal:     Cervical back: Normal range of motion.     Right knee: Swelling and effusion present. Decreased range of motion. Tenderness present over the medial joint line and lateral joint line. Abnormal alignment.  Neurological: She is alert and oriented to person, place, and time. She has normal reflexes.  Skin: Skin is warm and dry.  Psychiatric: She has a normal  mood and affect. Her behavior is normal. Judgment and thought content normal.    Vital signs in last 24 hours: @VSRANGES @  Labs:   Estimated body mass index is 27.32 kg/m as calculated from the following:   Height as of 05/21/19: 5\' 1"  (1.549 m).   Weight as of 05/21/19: 65.6 kg.   Imaging Review Plain radiographs demonstrate severe degenerative joint disease of the right knee(s). The overall alignment issignificant valgus. The bone quality appears to be adequate for age and reported activity level.      Assessment/Plan:  End stage arthritis, right knee   The patient history, physical examination, clinical judgment of the provider and imaging studies are consistent with end stage degenerative joint disease of the right knee(s) and total knee arthroplasty is deemed medically necessary. The treatment options including medical management, injection therapy arthroscopy and arthroplasty were discussed at length. The risks and benefits of total knee arthroplasty were presented and reviewed. The risks due to aseptic loosening, infection, stiffness, patella tracking problems, thromboembolic complications and other imponderables were discussed. The patient acknowledged the explanation, agreed to proceed with the plan and consent was signed. Patient is being admitted for inpatient treatment for surgery, pain control, PT, OT, prophylactic antibiotics, VTE prophylaxis, progressive ambulation and ADL's and discharge planning. The patient is planning to be discharged home with OPPT    Anticipated LOS equal to or greater than 2 midnights due to - Age 45 and older with one or more of the following:  - Obesity  - Expected need for hospital services (PT, OT, Nursing) required for safe  discharge  - Anticipated need for postoperative skilled nursing care or inpatient rehab  - Active co-morbidities: Diabetes and Coronary Artery Disease OR   - Unanticipated findings during/Post Surgery: None  -  Patient is a high risk of re-admission due to: None

## 2019-05-28 ENCOUNTER — Inpatient Hospital Stay (HOSPITAL_COMMUNITY)
Admission: RE | Admit: 2019-05-28 | Discharge: 2019-05-29 | DRG: 470 | Disposition: A | Discharge status: Home / Self Care | Payer: Managed Care, Other (non HMO) | Attending: Orthopedic Surgery | Admitting: Orthopedic Surgery

## 2019-05-28 ENCOUNTER — Encounter (HOSPITAL_COMMUNITY): Payer: Self-pay | Admitting: Orthopedic Surgery

## 2019-05-28 ENCOUNTER — Other Ambulatory Visit: Payer: Self-pay

## 2019-05-28 ENCOUNTER — Inpatient Hospital Stay (HOSPITAL_COMMUNITY): Payer: Managed Care, Other (non HMO)

## 2019-05-28 ENCOUNTER — Encounter (HOSPITAL_COMMUNITY)
Admission: RE | Disposition: A | Discharge status: Home / Self Care | Payer: Self-pay | Source: Home / Self Care | Attending: Orthopedic Surgery

## 2019-05-28 ENCOUNTER — Inpatient Hospital Stay (HOSPITAL_COMMUNITY): Payer: Managed Care, Other (non HMO) | Admitting: Anesthesiology

## 2019-05-28 ENCOUNTER — Inpatient Hospital Stay (HOSPITAL_COMMUNITY): Payer: Managed Care, Other (non HMO) | Admitting: Physician Assistant

## 2019-05-28 DIAGNOSIS — N183 Chronic kidney disease, stage 3 unspecified: Secondary | ICD-10-CM | POA: Diagnosis present

## 2019-05-28 DIAGNOSIS — Z90710 Acquired absence of both cervix and uterus: Secondary | ICD-10-CM

## 2019-05-28 DIAGNOSIS — Z79899 Other long term (current) drug therapy: Secondary | ICD-10-CM

## 2019-05-28 DIAGNOSIS — Z6827 Body mass index (BMI) 27.0-27.9, adult: Secondary | ICD-10-CM | POA: Diagnosis not present

## 2019-05-28 DIAGNOSIS — Z888 Allergy status to other drugs, medicaments and biological substances status: Secondary | ICD-10-CM | POA: Diagnosis not present

## 2019-05-28 DIAGNOSIS — Z7984 Long term (current) use of oral hypoglycemic drugs: Secondary | ICD-10-CM | POA: Diagnosis not present

## 2019-05-28 DIAGNOSIS — Z7982 Long term (current) use of aspirin: Secondary | ICD-10-CM

## 2019-05-28 DIAGNOSIS — E785 Hyperlipidemia, unspecified: Secondary | ICD-10-CM | POA: Diagnosis present

## 2019-05-28 DIAGNOSIS — F419 Anxiety disorder, unspecified: Secondary | ICD-10-CM | POA: Diagnosis present

## 2019-05-28 DIAGNOSIS — Z886 Allergy status to analgesic agent status: Secondary | ICD-10-CM | POA: Diagnosis not present

## 2019-05-28 DIAGNOSIS — I2510 Atherosclerotic heart disease of native coronary artery without angina pectoris: Secondary | ICD-10-CM | POA: Diagnosis present

## 2019-05-28 DIAGNOSIS — M1711 Unilateral primary osteoarthritis, right knee: Principal | ICD-10-CM | POA: Diagnosis present

## 2019-05-28 DIAGNOSIS — Z20828 Contact with and (suspected) exposure to other viral communicable diseases: Secondary | ICD-10-CM | POA: Diagnosis present

## 2019-05-28 DIAGNOSIS — Z87891 Personal history of nicotine dependence: Secondary | ICD-10-CM

## 2019-05-28 DIAGNOSIS — Z96642 Presence of left artificial hip joint: Secondary | ICD-10-CM | POA: Diagnosis present

## 2019-05-28 DIAGNOSIS — E669 Obesity, unspecified: Secondary | ICD-10-CM | POA: Diagnosis present

## 2019-05-28 DIAGNOSIS — E1122 Type 2 diabetes mellitus with diabetic chronic kidney disease: Secondary | ICD-10-CM | POA: Diagnosis present

## 2019-05-28 DIAGNOSIS — K219 Gastro-esophageal reflux disease without esophagitis: Secondary | ICD-10-CM | POA: Diagnosis present

## 2019-05-28 DIAGNOSIS — Z79891 Long term (current) use of opiate analgesic: Secondary | ICD-10-CM

## 2019-05-28 DIAGNOSIS — Z96651 Presence of right artificial knee joint: Secondary | ICD-10-CM

## 2019-05-28 DIAGNOSIS — I129 Hypertensive chronic kidney disease with stage 1 through stage 4 chronic kidney disease, or unspecified chronic kidney disease: Secondary | ICD-10-CM | POA: Diagnosis present

## 2019-05-28 DIAGNOSIS — D631 Anemia in chronic kidney disease: Secondary | ICD-10-CM | POA: Diagnosis present

## 2019-05-28 DIAGNOSIS — Z951 Presence of aortocoronary bypass graft: Secondary | ICD-10-CM

## 2019-05-28 HISTORY — PX: KNEE ARTHROPLASTY: SHX992

## 2019-05-28 LAB — TYPE AND SCREEN
ABO/RH(D): B POS
Antibody Screen: NEGATIVE

## 2019-05-28 LAB — GLUCOSE, CAPILLARY
Glucose-Capillary: 105 mg/dL — ABNORMAL HIGH (ref 70–99)
Glucose-Capillary: 151 mg/dL — ABNORMAL HIGH (ref 70–99)

## 2019-05-28 SURGERY — ARTHROPLASTY, KNEE, TOTAL, USING IMAGELESS COMPUTER-ASSISTED NAVIGATION
Anesthesia: Spinal | Site: Knee | Laterality: Right

## 2019-05-28 MED ORDER — LINAGLIPTIN 5 MG PO TABS
5.0000 mg | ORAL_TABLET | Freq: Every day | ORAL | Status: DC
  Administered 2019-05-29: 5 mg via ORAL
  Filled 2019-05-28: qty 1

## 2019-05-28 MED ORDER — LIDOCAINE 2% (20 MG/ML) 5 ML SYRINGE
INTRAMUSCULAR | Status: DC | PRN
  Administered 2019-05-28: 60 mg via INTRAVENOUS

## 2019-05-28 MED ORDER — FENTANYL CITRATE (PF) 100 MCG/2ML IJ SOLN
INTRAMUSCULAR | Status: DC | PRN
  Administered 2019-05-28: 100 ug via INTRAVENOUS

## 2019-05-28 MED ORDER — ACETAMINOPHEN 10 MG/ML IV SOLN
1000.0000 mg | INTRAVENOUS | Status: AC
  Administered 2019-05-28: 1000 mg via INTRAVENOUS
  Filled 2019-05-28: qty 100

## 2019-05-28 MED ORDER — CHLORHEXIDINE GLUCONATE 4 % EX LIQD: 60.0000 mL | Freq: Once | CUTANEOUS | Status: DC

## 2019-05-28 MED ORDER — POVIDONE-IODINE 10 % EX SWAB: 2.0000 "application " | Freq: Once | CUTANEOUS | Status: DC

## 2019-05-28 MED ORDER — METOCLOPRAMIDE HCL 5 MG PO TABS: 5.0000 mg | ORAL_TABLET | Freq: Three times a day (TID) | ORAL | Status: DC | PRN

## 2019-05-28 MED ORDER — METHOCARBAMOL 500 MG PO TABS
500.0000 mg | ORAL_TABLET | Freq: Four times a day (QID) | ORAL | Status: DC | PRN
  Administered 2019-05-28: 500 mg via ORAL
  Filled 2019-05-28: qty 1

## 2019-05-28 MED ORDER — DIPHENHYDRAMINE HCL 12.5 MG/5ML PO ELIX: 12.5000 mg | ORAL_SOLUTION | ORAL | Status: DC | PRN

## 2019-05-28 MED ORDER — SODIUM CHLORIDE 0.9 % IV SOLN: INTRAVENOUS | Status: DC

## 2019-05-28 MED ORDER — SITAGLIPTIN PHOS-METFORMIN HCL 50-1000 MG PO TABS: 1.0000 | ORAL_TABLET | Freq: Two times a day (BID) | ORAL | Status: DC

## 2019-05-28 MED ORDER — CEFAZOLIN SODIUM-DEXTROSE 2-4 GM/100ML-% IV SOLN
2.0000 g | INTRAVENOUS | Status: AC
  Administered 2019-05-28: 2 g via INTRAVENOUS
  Filled 2019-05-28: qty 100

## 2019-05-28 MED ORDER — ROPIVACAINE HCL 7.5 MG/ML IJ SOLN
INTRAMUSCULAR | Status: DC | PRN
  Administered 2019-05-28: 20 mL via PERINEURAL

## 2019-05-28 MED ORDER — METHOCARBAMOL 500 MG IVPB - SIMPLE MED
500.0000 mg | Freq: Four times a day (QID) | INTRAVENOUS | Status: DC | PRN
  Filled 2019-05-28: qty 50

## 2019-05-28 MED ORDER — ONDANSETRON HCL 4 MG PO TABS: 4.0000 mg | ORAL_TABLET | Freq: Four times a day (QID) | ORAL | Status: DC | PRN

## 2019-05-28 MED ORDER — TRANEXAMIC ACID-NACL 1000-0.7 MG/100ML-% IV SOLN
1000.0000 mg | INTRAVENOUS | Status: AC
  Administered 2019-05-28: 1000 mg via INTRAVENOUS
  Filled 2019-05-28: qty 100

## 2019-05-28 MED ORDER — SODIUM BICARBONATE 650 MG PO TABS
650.0000 mg | ORAL_TABLET | Freq: Two times a day (BID) | ORAL | Status: DC
  Administered 2019-05-28 – 2019-05-29 (×2): 650 mg via ORAL
  Filled 2019-05-28 (×3): qty 1

## 2019-05-28 MED ORDER — BUPIVACAINE HCL (PF) 0.25 % IJ SOLN
INTRAMUSCULAR | Status: AC
  Filled 2019-05-28: qty 30

## 2019-05-28 MED ORDER — HYDROCODONE-ACETAMINOPHEN 7.5-325 MG PO TABS
1.0000 | ORAL_TABLET | ORAL | Status: DC | PRN
  Administered 2019-05-28 (×2): 1 via ORAL
  Filled 2019-05-28 (×2): qty 1

## 2019-05-28 MED ORDER — DOCUSATE SODIUM 100 MG PO CAPS
100.0000 mg | ORAL_CAPSULE | Freq: Two times a day (BID) | ORAL | Status: DC
  Administered 2019-05-28 – 2019-05-29 (×2): 100 mg via ORAL
  Filled 2019-05-28 (×2): qty 1

## 2019-05-28 MED ORDER — PROPOFOL 500 MG/50ML IV EMUL
INTRAVENOUS | Status: AC
  Filled 2019-05-28: qty 50

## 2019-05-28 MED ORDER — PHENYLEPHRINE 40 MCG/ML (10ML) SYRINGE FOR IV PUSH (FOR BLOOD PRESSURE SUPPORT)
PREFILLED_SYRINGE | INTRAVENOUS | Status: AC
  Filled 2019-05-28: qty 10

## 2019-05-28 MED ORDER — PHENYLEPHRINE HCL (PRESSORS) 10 MG/ML IV SOLN
INTRAVENOUS | Status: AC
  Filled 2019-05-28: qty 1

## 2019-05-28 MED ORDER — ISOPROPYL ALCOHOL 70 % SOLN
Status: DC | PRN
  Administered 2019-05-28: 1 via TOPICAL

## 2019-05-28 MED ORDER — POLYETHYLENE GLYCOL 3350 17 G PO PACK: 17.0000 g | PACK | Freq: Every day | ORAL | Status: DC | PRN

## 2019-05-28 MED ORDER — FLUTICASONE PROPIONATE 50 MCG/ACT NA SUSP
1.0000 | Freq: Every day | NASAL | Status: DC
  Administered 2019-05-29: 1 via NASAL
  Filled 2019-05-28: qty 16

## 2019-05-28 MED ORDER — LACTATED RINGERS IV SOLN: INTRAVENOUS | Status: DC

## 2019-05-28 MED ORDER — CELECOXIB 200 MG PO CAPS
200.0000 mg | ORAL_CAPSULE | Freq: Two times a day (BID) | ORAL | Status: DC
  Administered 2019-05-28 – 2019-05-29 (×2): 200 mg via ORAL
  Filled 2019-05-28 (×2): qty 1

## 2019-05-28 MED ORDER — ONDANSETRON HCL 4 MG/2ML IJ SOLN: 4.0000 mg | Freq: Four times a day (QID) | INTRAMUSCULAR | Status: DC | PRN

## 2019-05-28 MED ORDER — METOCLOPRAMIDE HCL 5 MG/ML IJ SOLN: 5.0000 mg | Freq: Three times a day (TID) | INTRAMUSCULAR | Status: DC | PRN

## 2019-05-28 MED ORDER — METFORMIN HCL 500 MG PO TABS
500.0000 mg | ORAL_TABLET | Freq: Two times a day (BID) | ORAL | Status: DC
  Administered 2019-05-29: 500 mg via ORAL
  Filled 2019-05-28: qty 1

## 2019-05-28 MED ORDER — SODIUM CHLORIDE (PF) 0.9 % IJ SOLN
INTRAMUSCULAR | Status: AC
  Filled 2019-05-28: qty 50

## 2019-05-28 MED ORDER — EPHEDRINE 5 MG/ML INJ
INTRAVENOUS | Status: AC
  Filled 2019-05-28: qty 10

## 2019-05-28 MED ORDER — ASPIRIN 81 MG PO CHEW
81.0000 mg | CHEWABLE_TABLET | Freq: Two times a day (BID) | ORAL | Status: DC
  Administered 2019-05-28 – 2019-05-29 (×2): 81 mg via ORAL
  Filled 2019-05-28 (×2): qty 1

## 2019-05-28 MED ORDER — DEXAMETHASONE SODIUM PHOSPHATE 10 MG/ML IJ SOLN
10.0000 mg | Freq: Once | INTRAMUSCULAR | Status: AC
  Administered 2019-05-29: 10 mg via INTRAVENOUS
  Filled 2019-05-28: qty 1

## 2019-05-28 MED ORDER — DEXAMETHASONE SODIUM PHOSPHATE 10 MG/ML IJ SOLN
INTRAMUSCULAR | Status: AC
  Filled 2019-05-28: qty 1

## 2019-05-28 MED ORDER — FENTANYL CITRATE (PF) 100 MCG/2ML IJ SOLN: 25.0000 ug | INTRAMUSCULAR | Status: DC | PRN

## 2019-05-28 MED ORDER — PANTOPRAZOLE SODIUM 40 MG PO TBEC
80.0000 mg | DELAYED_RELEASE_TABLET | Freq: Every day | ORAL | Status: DC
  Administered 2019-05-29: 80 mg via ORAL
  Filled 2019-05-28: qty 2

## 2019-05-28 MED ORDER — FENTANYL CITRATE (PF) 100 MCG/2ML IJ SOLN
INTRAMUSCULAR | Status: AC
  Filled 2019-05-28: qty 2

## 2019-05-28 MED ORDER — BUPIVACAINE HCL (PF) 0.25 % IJ SOLN
INTRAMUSCULAR | Status: DC | PRN
  Administered 2019-05-28: 30 mL

## 2019-05-28 MED ORDER — MENTHOL 3 MG MT LOZG: 1.0000 | LOZENGE | OROMUCOSAL | Status: DC | PRN

## 2019-05-28 MED ORDER — ACETAMINOPHEN 325 MG PO TABS: 325.0000 mg | ORAL_TABLET | Freq: Four times a day (QID) | ORAL | Status: DC | PRN

## 2019-05-28 MED ORDER — CEFAZOLIN SODIUM-DEXTROSE 2-4 GM/100ML-% IV SOLN
2.0000 g | Freq: Four times a day (QID) | INTRAVENOUS | Status: AC
  Administered 2019-05-28 (×2): 2 g via INTRAVENOUS
  Filled 2019-05-28 (×2): qty 100

## 2019-05-28 MED ORDER — EZETIMIBE 10 MG PO TABS
10.0000 mg | ORAL_TABLET | Freq: Every day | ORAL | Status: DC
  Administered 2019-05-28: 10 mg via ORAL
  Filled 2019-05-28: qty 1

## 2019-05-28 MED ORDER — ONDANSETRON HCL 4 MG/2ML IJ SOLN
INTRAMUSCULAR | Status: AC
  Filled 2019-05-28: qty 2

## 2019-05-28 MED ORDER — ONDANSETRON HCL 4 MG/2ML IJ SOLN: 4.0000 mg | Freq: Once | INTRAMUSCULAR | Status: DC | PRN

## 2019-05-28 MED ORDER — BUPIVACAINE IN DEXTROSE 0.75-8.25 % IT SOLN
INTRATHECAL | Status: DC | PRN
  Administered 2019-05-28: 1.7 mL via INTRATHECAL

## 2019-05-28 MED ORDER — LORATADINE 10 MG PO TABS
10.0000 mg | ORAL_TABLET | Freq: Every day | ORAL | Status: DC
  Administered 2019-05-29: 10 mg via ORAL
  Filled 2019-05-28: qty 1

## 2019-05-28 MED ORDER — HYDROCODONE-ACETAMINOPHEN 5-325 MG PO TABS
1.0000 | ORAL_TABLET | ORAL | Status: DC | PRN
  Administered 2019-05-28: 2 via ORAL
  Administered 2019-05-28: 1 via ORAL
  Administered 2019-05-29 (×3): 2 via ORAL
  Filled 2019-05-28 (×5): qty 2

## 2019-05-28 MED ORDER — FOLIC ACID 1 MG PO TABS
1.0000 mg | ORAL_TABLET | Freq: Every evening | ORAL | Status: DC
  Administered 2019-05-28: 1 mg via ORAL
  Filled 2019-05-28: qty 1

## 2019-05-28 MED ORDER — SENNA 8.6 MG PO TABS
1.0000 | ORAL_TABLET | Freq: Two times a day (BID) | ORAL | Status: DC
  Administered 2019-05-28 – 2019-05-29 (×2): 8.6 mg via ORAL
  Filled 2019-05-28 (×2): qty 1

## 2019-05-28 MED ORDER — MORPHINE SULFATE (PF) 4 MG/ML IV SOLN: 0.5000 mg | INTRAVENOUS | Status: DC | PRN

## 2019-05-28 MED ORDER — LIDOCAINE 2% (20 MG/ML) 5 ML SYRINGE
INTRAMUSCULAR | Status: AC
  Filled 2019-05-28: qty 5

## 2019-05-28 MED ORDER — STERILE WATER FOR IRRIGATION IR SOLN
Status: DC | PRN
  Administered 2019-05-28: 2000 mL

## 2019-05-28 MED ORDER — BUPROPION HCL 100 MG PO TABS
100.0000 mg | ORAL_TABLET | Freq: Every day | ORAL | Status: DC | PRN
  Filled 2019-05-28: qty 1

## 2019-05-28 MED ORDER — SODIUM CHLORIDE (PF) 0.9 % IJ SOLN
INTRAMUSCULAR | Status: DC | PRN
  Administered 2019-05-28: 30 mL

## 2019-05-28 MED ORDER — MIDAZOLAM HCL 2 MG/2ML IJ SOLN
INTRAMUSCULAR | Status: AC
  Filled 2019-05-28: qty 2

## 2019-05-28 MED ORDER — PROPOFOL 500 MG/50ML IV EMUL
INTRAVENOUS | Status: DC | PRN
  Administered 2019-05-28: 60 mg via INTRAVENOUS

## 2019-05-28 MED ORDER — KETOROLAC TROMETHAMINE 30 MG/ML IJ SOLN
INTRAMUSCULAR | Status: AC
  Filled 2019-05-28: qty 1

## 2019-05-28 MED ORDER — MIDAZOLAM HCL 2 MG/2ML IJ SOLN
INTRAMUSCULAR | Status: DC | PRN
  Administered 2019-05-28: 2 mg via INTRAVENOUS

## 2019-05-28 MED ORDER — METOPROLOL TARTRATE 50 MG PO TABS
50.0000 mg | ORAL_TABLET | Freq: Two times a day (BID) | ORAL | Status: DC
  Administered 2019-05-28 – 2019-05-29 (×2): 50 mg via ORAL
  Filled 2019-05-28 (×2): qty 1

## 2019-05-28 MED ORDER — DEXAMETHASONE SODIUM PHOSPHATE 10 MG/ML IJ SOLN
INTRAMUSCULAR | Status: DC | PRN
  Administered 2019-05-28: 10 mg via INTRAVENOUS

## 2019-05-28 MED ORDER — PHENYLEPHRINE 40 MCG/ML (10ML) SYRINGE FOR IV PUSH (FOR BLOOD PRESSURE SUPPORT)
PREFILLED_SYRINGE | INTRAVENOUS | Status: DC | PRN
  Administered 2019-05-28: 80 ug via INTRAVENOUS
  Administered 2019-05-28: 40 ug via INTRAVENOUS
  Administered 2019-05-28: 80 ug via INTRAVENOUS

## 2019-05-28 MED ORDER — PROPOFOL 500 MG/50ML IV EMUL
INTRAVENOUS | Status: DC | PRN
  Administered 2019-05-28: 75 ug/kg/min via INTRAVENOUS

## 2019-05-28 MED ORDER — SODIUM CHLORIDE 0.9 % IV SOLN
INTRAVENOUS | Status: DC
  Administered 2019-05-28: 150 mL/h via INTRAVENOUS

## 2019-05-28 MED ORDER — ONDANSETRON HCL 4 MG/2ML IJ SOLN
INTRAMUSCULAR | Status: DC | PRN
  Administered 2019-05-28: 4 mg via INTRAVENOUS

## 2019-05-28 MED ORDER — POVIDONE-IODINE 10 % EX SWAB
2.0000 "application " | Freq: Once | CUTANEOUS | Status: AC
  Administered 2019-05-28: 2 via TOPICAL

## 2019-05-28 MED ORDER — ACETAMINOPHEN 10 MG/ML IV SOLN: 1000.0000 mg | Freq: Once | INTRAVENOUS | Status: DC | PRN

## 2019-05-28 MED ORDER — ISOPROPYL ALCOHOL 70 % SOLN
Status: AC
  Filled 2019-05-28: qty 480

## 2019-05-28 MED ORDER — KETOROLAC TROMETHAMINE 30 MG/ML IJ SOLN
INTRAMUSCULAR | Status: DC | PRN
  Administered 2019-05-28: 30 mg

## 2019-05-28 MED ORDER — METHOCARBAMOL 500 MG IVPB - SIMPLE MED
INTRAVENOUS | Status: AC
  Administered 2019-05-28: 500 mg via INTRAVENOUS
  Filled 2019-05-28: qty 50

## 2019-05-28 MED ORDER — ALUM & MAG HYDROXIDE-SIMETH 200-200-20 MG/5ML PO SUSP: 30.0000 mL | ORAL | Status: DC | PRN

## 2019-05-28 MED ORDER — PHENOL 1.4 % MT LIQD: 1.0000 | OROMUCOSAL | Status: DC | PRN

## 2019-05-28 SURGICAL SUPPLY — 73 items
BAG ZIPLOCK 12X15 (MISCELLANEOUS) IMPLANT
BASEPLATE TIBIAL SZ2 TRI (Joint) ×1 IMPLANT
BATTERY INSTRU NAVIGATION (MISCELLANEOUS) ×9 IMPLANT
BLADE SAW RECIPROCATING 77.5 (BLADE) ×3 IMPLANT
BNDG ELASTIC 4X5.8 VLCR STR LF (GAUZE/BANDAGES/DRESSINGS) ×3 IMPLANT
BNDG ELASTIC 6X15 VLCR STRL LF (GAUZE/BANDAGES/DRESSINGS) ×3 IMPLANT
BNDG ELASTIC 6X5.8 VLCR STR LF (GAUZE/BANDAGES/DRESSINGS) ×3 IMPLANT
CHLORAPREP W/TINT 26 (MISCELLANEOUS) ×6 IMPLANT
COMPONENT TRI CR RETAIN KNEE (Orthopedic Implant) ×1 IMPLANT
COVER SURGICAL LIGHT HANDLE (MISCELLANEOUS) ×3 IMPLANT
COVER WAND RF STERILE (DRAPES) IMPLANT
CUFF TOURN SGL QUICK 34 (TOURNIQUET CUFF) ×2
CUFF TRNQT CYL 34X4.125X (TOURNIQUET CUFF) ×1 IMPLANT
DECANTER SPIKE VIAL GLASS SM (MISCELLANEOUS) ×3 IMPLANT
DERMABOND ADVANCED (GAUZE/BANDAGES/DRESSINGS) ×4
DERMABOND ADVANCED .7 DNX12 (GAUZE/BANDAGES/DRESSINGS) ×2 IMPLANT
DRAPE SHEET LG 3/4 BI-LAMINATE (DRAPES) ×9 IMPLANT
DRAPE U-SHAPE 47X51 STRL (DRAPES) ×3 IMPLANT
DRSG AQUACEL AG ADV 3.5X10 (GAUZE/BANDAGES/DRESSINGS) ×3 IMPLANT
DRSG TEGADERM 4X4.75 (GAUZE/BANDAGES/DRESSINGS) IMPLANT
ELECT BLADE TIP CTD 4 INCH (ELECTRODE) ×3 IMPLANT
ELECT REM PT RETURN 15FT ADLT (MISCELLANEOUS) ×3 IMPLANT
EVACUATOR 1/8 PVC DRAIN (DRAIN) IMPLANT
GAUZE SPONGE 4X4 12PLY STRL (GAUZE/BANDAGES/DRESSINGS) ×3 IMPLANT
GLOVE BIO SURGEON STRL SZ8.5 (GLOVE) ×6 IMPLANT
GLOVE BIOGEL PI IND STRL 8.5 (GLOVE) ×1 IMPLANT
GLOVE BIOGEL PI INDICATOR 8.5 (GLOVE) ×2
GLOVE COTTON UNIV LF STRL (GLOVE) ×6 IMPLANT
GOWN SPEC L3 XXLG W/TWL (GOWN DISPOSABLE) ×3 IMPLANT
HANDPIECE INTERPULSE COAX TIP (DISPOSABLE) ×2
HOLDER FOLEY CATH W/STRAP (MISCELLANEOUS) ×3 IMPLANT
HOOD PEEL AWAY FLYTE STAYCOOL (MISCELLANEOUS) ×9 IMPLANT
INSERT TIB 2 9XBRNG CRCTE RTN (Insert) ×1 IMPLANT
JET LAVAGE IRRISEPT WOUND (IRRIGATION / IRRIGATOR) ×3
KIT TURNOVER KIT A (KITS) IMPLANT
KNEE INSERT TIB BRG (Insert) ×2 IMPLANT
KNEE PATELLA ASYMMETRIC 10X32 (Knees) ×3 IMPLANT
LAVAGE JET IRRISEPT WOUND (IRRIGATION / IRRIGATOR) ×1 IMPLANT
MARKER SKIN DUAL TIP RULER LAB (MISCELLANEOUS) ×3 IMPLANT
NDL SAFETY ECLIPSE 18X1.5 (NEEDLE) ×1 IMPLANT
NEEDLE HYPO 18GX1.5 SHARP (NEEDLE) ×2
NEEDLE SPNL 18GX3.5 QUINCKE PK (NEEDLE) ×3 IMPLANT
NS IRRIG 1000ML POUR BTL (IV SOLUTION) ×3 IMPLANT
PACK TOTAL KNEE CUSTOM (KITS) ×3 IMPLANT
PADDING CAST COTTON 6X4 STRL (CAST SUPPLIES) ×3 IMPLANT
PENCIL SMOKE EVACUATOR (MISCELLANEOUS) ×3 IMPLANT
PIN FLUTED HEDLESS FIX 3.5X1/8 (PIN) ×3 IMPLANT
PROTECTOR NERVE ULNAR (MISCELLANEOUS) ×3 IMPLANT
SAW OSC TIP CART 19.5X105X1.3 (SAW) ×3 IMPLANT
SEALER BIPOLAR AQUA 6.0 (INSTRUMENTS) ×3 IMPLANT
SET HNDPC FAN SPRY TIP SCT (DISPOSABLE) ×1 IMPLANT
SET PAD KNEE POSITIONER (MISCELLANEOUS) ×3 IMPLANT
SLEEVE SUCTION CATH 165 (SLEEVE) ×3 IMPLANT
SPONGE DRAIN TRACH 4X4 STRL 2S (GAUZE/BANDAGES/DRESSINGS) IMPLANT
SPONGE LAP 18X18 RF (DISPOSABLE) IMPLANT
SUT MNCRL AB 3-0 PS2 18 (SUTURE) ×3 IMPLANT
SUT MNCRL AB 4-0 PS2 18 (SUTURE) ×3 IMPLANT
SUT MON AB 2-0 CT1 36 (SUTURE) ×6 IMPLANT
SUT STRATAFIX PDO 1 14 VIOLET (SUTURE) ×2
SUT STRATFX PDO 1 14 VIOLET (SUTURE) ×1
SUT VIC AB 1 CTX 36 (SUTURE) ×4
SUT VIC AB 1 CTX36XBRD ANBCTR (SUTURE) ×2 IMPLANT
SUT VIC AB 2-0 CT1 27 (SUTURE) ×2
SUT VIC AB 2-0 CT1 TAPERPNT 27 (SUTURE) ×1 IMPLANT
SUTURE STRATFX PDO 1 14 VIOLET (SUTURE) ×1 IMPLANT
SYR 3ML LL SCALE MARK (SYRINGE) ×3 IMPLANT
TIBIAL BASEPLATE SZ2 TRI (Joint) ×3 IMPLANT
TOWER CARTRIDGE SMART MIX (DISPOSABLE) IMPLANT
TRAY FOLEY MTR SLVR 16FR STAT (SET/KITS/TRAYS/PACK) IMPLANT
TRIA CRUCIATE RETAIN KNEE (Orthopedic Implant) ×3 IMPLANT
WATER STERILE IRR 1000ML POUR (IV SOLUTION) ×6 IMPLANT
WRAP KNEE MAXI GEL POST OP (GAUZE/BANDAGES/DRESSINGS) ×3 IMPLANT
YANKAUER SUCT BULB TIP 10FT TU (MISCELLANEOUS) ×3 IMPLANT

## 2019-05-28 NOTE — Anesthesia Postprocedure Evaluation (Signed)
Anesthesia Post Note  Patient: Cassidy Green  Procedure(s) Performed: COMPUTER ASSISTED TOTAL KNEE ARTHROPLASTY (Right Knee)     Patient location during evaluation: Nursing Unit Anesthesia Type: Spinal Level of consciousness: oriented and awake and alert Pain management: pain level controlled Vital Signs Assessment: post-procedure vital signs reviewed and stable Respiratory status: spontaneous breathing and respiratory function stable Cardiovascular status: blood pressure returned to baseline and stable Postop Assessment: no headache, no backache, no apparent nausea or vomiting and patient able to bend at knees Anesthetic complications: no    Last Vitals:  Vitals:   05/28/19 1440 05/28/19 1537  BP: 129/70 137/71  Pulse: 79 86  Resp: 16 16  Temp: 36.6 C (!) 35.9 C  SpO2: 100% 100%    Last Pain:  Vitals:   05/28/19 1537  TempSrc: Axillary  PainSc:                  Barnet Glasgow

## 2019-05-28 NOTE — Anesthesia Procedure Notes (Signed)
Spinal  Patient location during procedure: OR Start time: 05/28/2019 7:44 AM End time: 05/28/2019 7:54 AM Staffing Anesthesiologist: Barnet Glasgow, MD Preanesthetic Checklist Completed: patient identified, IV checked, site marked, risks and benefits discussed, surgical consent, monitors and equipment checked, pre-op evaluation and timeout performed Spinal Block Patient position: sitting Prep: DuraPrep Patient monitoring: heart rate, cardiac monitor, continuous pulse ox and blood pressure Approach: midline Location: L3-4 Injection technique: single-shot Needle Needle type: Sprotte  Needle gauge: 24 G Needle length: 9 cm Needle insertion depth: 4 cm Assessment Sensory level: T4 Additional Notes 1 Attempt .

## 2019-05-28 NOTE — Transfer of Care (Signed)
Immediate Anesthesia Transfer of Care Note  Patient: Cassidy Green  Procedure(s) Performed: COMPUTER ASSISTED TOTAL KNEE ARTHROPLASTY (Right Knee)  Patient Location: PACU  Anesthesia Type:General  Level of Consciousness: awake, alert , oriented and patient cooperative  Airway & Oxygen Therapy: Patient Spontanous Breathing and Patient connected to face mask oxygen  Post-op Assessment: Report given to RN and Post -op Vital signs reviewed and stable  Post vital signs: Reviewed and stable  Last Vitals:  Vitals Value Taken Time  BP 112/58 05/28/19 1032  Temp    Pulse 77 05/28/19 1033  Resp 17 05/28/19 1033  SpO2 100 % 05/28/19 1033  Vitals shown include unvalidated device data.  Last Pain:  Vitals:   05/28/19 0600  TempSrc: Oral         Complications: No apparent anesthesia complications

## 2019-05-28 NOTE — Interval H&P Note (Signed)
History and Physical Interval Note:  05/28/2019 7:41 AM  Cassidy Green  has presented today for surgery, with the diagnosis of Degnerative joint disease right knee.  The various methods of treatment have been discussed with the patient and family. After consideration of risks, benefits and other options for treatment, the patient has consented to  Procedure(s): COMPUTER ASSISTED TOTAL KNEE ARTHROPLASTY (Right) as a surgical intervention.  The patient's history has been reviewed, patient examined, no change in status, stable for surgery.  I have reviewed the patient's chart and labs.  Questions were answered to the patient's satisfaction.     [name]

## 2019-05-28 NOTE — Discharge Instructions (Signed)
° °Dr. Kayode Petion °Total Joint Specialist °[facility] °[address] °[place], [place] [address] °[phone] ° °TOTAL KNEE REPLACEMENT POSTOPERATIVE DIRECTIONS ° ° ° °Knee Rehabilitation, Guidelines Following Surgery  °Results after knee surgery are often greatly improved when you follow the exercise, range of motion and muscle strengthening exercises prescribed by your doctor. Safety measures are also important to protect the knee from further injury. Any time any of these exercises cause you to have increased pain or swelling in your knee joint, decrease the amount until you are comfortable again and slowly increase them. If you have problems or questions, call your caregiver or physical therapist for advice.  ° °WEIGHT BEARING °Weight bearing as tolerated with assist device (walker, cane, etc) as directed, use it as long as suggested by your surgeon or therapist, typically at least 4-6 weeks. ° °HOME CARE INSTRUCTIONS  °Remove items at home which could result in a fall. This includes throw rugs or furniture in walking pathways.  °Continue medications as instructed at time of discharge. °You may have some home medications which will be placed on hold until you complete the course of blood thinner medication.  °You may start showering once you are discharged home but do not submerge the incision under water. Just pat the incision dry and apply a dry gauze dressing on daily. °Walk with walker as instructed.  °You may resume a sexual relationship in one month or when given the OK by your doctor.  °· Use walker as long as suggested by your caregivers. °· Avoid periods of inactivity such as sitting longer than an hour when not asleep. This helps prevent blood clots.  °You may put full weight on your legs and walk as much as is comfortable.  °You may return to work once you are cleared by your doctor.  °Do not drive a car for 6 weeks or until released by you surgeon.  °· Do not drive  while taking narcotics.  °Wear the elastic stockings for three weeks following surgery during the day but you may remove then at night. °Make sure you keep all of your appointments after your operation with all of your doctors and caregivers. You should call the office at the above phone number and make an appointment for approximately two weeks after the date of your surgery. °Do not remove your surgical dressing. The dressing is waterproof; you may take showers in 3 days, but do not take tub baths or submerge the dressing. °Please pick up a stool softener and laxative for home use as long as you are requiring pain medications. °· ICE to the affected knee every three hours for 30 minutes at a time and then as needed for pain and swelling.  Continue to use ice on the knee for pain and swelling from surgery. You may notice swelling that will progress down to the foot and ankle.  This is normal after surgery.  Elevate the leg when you are not up walking on it.   °It is important for you to complete the blood thinner medication as prescribed by your doctor. °· Continue to use the breathing machine which will help keep your temperature down.  It is common for your temperature to cycle up and down following surgery, especially at night when you are not up moving around and exerting yourself.  The breathing machine keeps your lungs expanded and your temperature down. ° °RANGE OF MOTION AND STRENGTHENING EXERCISES  °Rehabilitation of the knee is important following   a knee injury or an operation. After just a few days of immobilization, the muscles of the thigh which control the knee become weakened and shrink (atrophy). Knee exercises are designed to build up the tone and strength of the thigh muscles and to improve knee motion. Often times heat used for twenty to thirty minutes before working out will loosen up your tissues and help with improving the range of motion but do not use heat for the first two weeks following  surgery. These exercises can be done on a training (exercise) mat, on the floor, on a table or on a bed. Use what ever works the best and is most comfortable for you Knee exercises include:  °Leg Lifts - While your knee is still immobilized in a splint or cast, you can do straight leg raises. Lift the leg to 60 degrees, hold for 3 sec, and slowly lower the leg. Repeat 10-20 times 2-3 times daily. Perform this exercise against resistance later as your knee gets better.  °Quad and Hamstring Sets - Tighten up the muscle on the front of the thigh (Quad) and hold for 5-10 sec. Repeat this 10-20 times hourly. Hamstring sets are done by pushing the foot backward against an object and holding for 5-10 sec. Repeat as with quad sets.  °A rehabilitation program following serious knee injuries can speed recovery and prevent re-injury in the future due to weakened muscles. Contact your doctor or a physical therapist for more information on knee rehabilitation.  ° °SKILLED REHAB INSTRUCTIONS: °If the patient is transferred to a skilled rehab facility following release from the hospital, a list of the current medications will be sent to the facility for the patient to continue.  When discharged from the skilled rehab facility, please have the facility set up the patient's Home Health Physical Therapy prior to being released. Also, the skilled facility will be responsible for providing the patient with their medications at time of release from the facility to include their pain medication, the muscle relaxants, and their blood thinner medication. If the patient is still at the rehab facility at time of the two week follow up appointment, the skilled rehab facility will also need to assist the patient in arranging follow up appointment in our office and any transportation needs. ° °MAKE SURE YOU:  °Understand these instructions.  °Will watch your condition.  °Will get help right away if you are not doing well or get worse.   ° ° °Pick up stool softner and laxative for home use following surgery while on pain medications. °Do NOT remove your dressing. You may shower.  °Do not take tub baths or submerge incision under water. °May shower starting three days after surgery. °Please use a clean towel to pat the incision dry following showers. °Continue to use ice for pain and swelling after surgery. °Do not use any lotions or creams on the incision until instructed by your surgeon. ° °

## 2019-05-28 NOTE — Op Note (Signed)
OPERATIVE REPORT  SURGEON: Rod Can, MD   ASSISTANT: Nehemiah Massed, PA-C.  PREOPERATIVE DIAGNOSIS: Right knee arthritis.   POSTOPERATIVE DIAGNOSIS: Right knee arthritis.   PROCEDURE: Right total knee arthroplasty.   IMPLANTS: Stryker Triathlon CR femur, size 3. Stryker Tritanium tibia, size 2. X3 polyethelyene insert, size 9 mm, CR. 3 button asymmetric patella, size 32 mm.  ANESTHESIA:  MAC and Spinal  TOURNIQUET TIME: Not utilized.   ESTIMATED BLOOD LOSS:-200 mL    ANTIBIOTICS: 2 g Ancef.  DRAINS: None.  COMPLICATIONS: None   CONDITION: PACU - hemodynamically stable.   BRIEF CLINICAL NOTE: Cassidy Green is a 63 y.o. female with a long-standing history of Right knee arthritis. After failing conservative management, the patient was indicated for total knee arthroplasty. The risks, benefits, and alternatives to the procedure were explained, and the patient elected to proceed.  PROCEDURE IN DETAIL: Adductor canal block was obtained in the pre-op holding area. Once inside the operative room, spinal anesthesia was obtained, and a foley catheter was inserted. The patient was then positioned, a nonsterile tourniquet was placed, and the lower extremity was prepped and draped in the normal sterile surgical fashion.  A time-out was called verifying side and site of surgery. The patient received IV antibiotics within 60 minutes of beginning the procedure. The tourniquet was not utilized.   An anterior approach to the knee was performed utilizing a midvastus arthrotomy. A medial release was performed and the patellar fat pad was excised. Stryker navigation was used to cut the distal femur perpendicular to the mechanical axis. A freehand patellar resection was performed, and the patella was sized an prepared with 3 lug holes.  Nagivation was used to make a neutral proximal tibia  resection, taking 3 mm of bone from the less affected medial side with 3 degrees of slope. The menisci were excised. A spacer block was placed, and the alignment and balance in extension were confirmed.   The distal femur was sized using the 3-degree external rotation guide referencing the posterior femoral cortex. The appropriate 4-in-1 cutting block was pinned into place. Rotation was checked using Whiteside's line, the epicondylar axis, and then confirmed with a spacer block in flexion. The remaining femoral cuts were performed, taking care to protect the MCL.  The tibia was sized and the trial tray was pinned into place. The remaining trail components were inserted. The knee was stable to varus and valgus stress through a full range of motion. The patella tracked centrally, and the PCL was well balanced. The trial components were removed, and the proximal tibial surface was prepared. Final components were impacted into place. The knee was tested for a final time and found to be well balanced.   The wound was copiously irrigated with Irrisept solution and normal saline using pule lavage.  Marcaine solution was injected into the periarticular soft tissue.  The wound was closed in layers using #1 Vicryl and Stratafix for the fascia, 2-0 Vicryl for the subcutaneous fat, 2-0 Monocryl for the deep dermal layer, and staples for the skin. Dermabond was applied to the skin.  Once the glue was fully dried, an Aquacell Ag and compressive dressing were applied.  Tthe patient was transported to the recovery room in stable condition.  Sponge, needle, and instrument counts were correct at the end of the case x2.  The patient tolerated the procedure well and there were no known complications.  Please note that a surgical assistant was a medical necessity for this procedure in order  to perform it in a safe and expeditious manner. Surgical assistant was necessary to retract the ligaments and vital neurovascular structures  to prevent injury to them and also necessary for proper positioning of the limb to allow for anatomic placement of the prosthesis.

## 2019-05-28 NOTE — Evaluation (Signed)
Physical Therapy Evaluation Patient Details Name: Cassidy Green MRN: [id number] DOB: [birth year] Today's Date: 05/28/2019   History of Present Illness  Patient is 63 y.o. female s/p Rt TKA on 05/28/19 with PMH significant for HTN, HLD, GERD, DM, CKD, CAD s/p CABG.    Clinical Impression  Cassidy Green is a 63 y.o. female POD 0 s/p Rt TKA. Patient reports Independence with mobility at baseline with occasional use of SPC or RW due to pain. Patient is now limited by functional impairments (see PT problem list below) and requires min assist for transfers and gait with RW. Patient was able to ambulate ~60 feet with RW and min assist. Patient instructed in exercise to facilitate ROM and circulation. Patient will benefit from continued skilled PT interventions to address impairments and progress towards PLOF. Acute PT will follow to progress mobility and stair training in preparation for safe discharge home.    Follow Up Recommendations Follow surgeon's recommendation for DC plan and follow-up therapies    Equipment Recommendations  None recommended by PT    Recommendations for Other Services       Precautions / Restrictions Precautions Precautions: Fall Restrictions Weight Bearing Restrictions: No      Mobility  Bed Mobility Overal bed mobility: Needs Assistance Bed Mobility: Supine to Sit     Supine to sit: HOB elevated;Min guard     General bed mobility comments: cues for use of bed rail, HOB elevated and no assist required to sit upright at EOB  Transfers Overall transfer level: Needs assistance Equipment used: Rolling walker (2 wheeled) Transfers: Sit to/from Stand Sit to Stand: Min assist         General transfer comment: cues for safe hand placement and technique with RW, light assist required to perform power up to rise  Ambulation/Gait Ambulation/Gait assistance: Min assist Gait Distance (Feet): 60 Feet Assistive device: Rolling walker (2 wheeled) Gait  Pattern/deviations: Step-through pattern;Decreased stride length;Decreased stance time - right Gait velocity: decreased   General Gait Details: cues for safe hand placement, proximity, and step pattern wtih RW. no overt LOB noted, intermittent min assist to steady and manage RW position.  Stairs            Wheelchair Mobility    Modified Rankin (Stroke Patients Only)       Balance Overall balance assessment: Needs assistance Sitting-balance support: Feet supported Sitting balance-Leahy Scale: Good     Standing balance support: During functional activity;Bilateral upper extremity supported Standing balance-Leahy Scale: Fair              Pertinent Vitals/Pain Pain Assessment: Faces Faces Pain Scale: Hurts little more Pain Location: Rt knee Pain Descriptors / Indicators: Aching;Sore Pain Intervention(s): Monitored during session;Repositioned;Ice applied;Limited activity within patient's tolerance    Home Living Family/patient expects to be discharged to:: Private residence Living Arrangements: Spouse/significant other Available Help at Discharge: Family;Available 24 hours/day(pt's husband and her daughter will be available to assist her) Type of Home: House Home Access: Stairs to enter Entrance Stairs-Rails: Psychiatric nurse of Steps: 3 Home Layout: One level Home Equipment: Walker - 2 wheels;Cane - single point;Grab bars - tub/shower;Bedside commode(bought grab bars to suction to install)      Prior Function Level of Independence: Independent         Comments: pt has used SPC and RW for pain relief intermittently     Hand Dominance   Dominant Hand: Right    Extremity/Trunk Assessment   Upper Extremity Assessment Upper Extremity Assessment:  Overall WFL for tasks assessed    Lower Extremity Assessment Lower Extremity Assessment: Overall WFL for tasks assessed;RLE deficits/detail RLE Deficits / Details: good quad activation and no  extensor lag with SLR RLE Sensation: WNL RLE Coordination: WNL    Cervical / Trunk Assessment Cervical / Trunk Assessment: Normal  Communication   Communication: No difficulties  Cognition Arousal/Alertness: Awake/alert Behavior During Therapy: WFL for tasks assessed/performed Overall Cognitive Status: Within Functional Limits for tasks assessed           General Comments      Exercises Total Joint Exercises Ankle Circles/Pumps: AROM;10 reps;Seated;Both Quad Sets: AROM;5 reps;Seated;Right Heel Slides: AROM;5 reps;Right   Assessment/Plan    PT Assessment Patient needs continued PT services  PT Problem List Decreased strength;Decreased activity tolerance;Decreased balance;Decreased range of motion;Decreased mobility;Decreased knowledge of use of DME       PT Treatment Interventions Functional mobility training;DME instruction;Gait training;Therapeutic activities;Therapeutic exercise;Stair training;Balance training;Patient/family education    PT Goals (Current goals can be found in the Care Plan section)  Acute Rehab PT Goals Patient Stated Goal: get back to independence PT Goal Formulation: With patient Time For Goal Achievement: 06/04/19 Potential to Achieve Goals: Good    Frequency 7X/week    AM-PAC PT "6 Clicks" Mobility  Outcome Measure Help needed turning from your back to your side while in a flat bed without using bedrails?: A Little Help needed moving from lying on your back to sitting on the side of a flat bed without using bedrails?: A Little Help needed moving to and from a bed to a chair (including a wheelchair)?: A Little Help needed standing up from a chair using your arms (e.g., wheelchair or bedside chair)?: A Little Help needed to walk in hospital room?: A Little Help needed climbing 3-5 steps with a railing? : A Little 6 Click Score: 18    End of Session Equipment Utilized During Treatment: Gait belt Activity Tolerance: Patient tolerated  treatment well Patient left: in chair;with call bell/phone within reach Nurse Communication: Mobility status PT Visit Diagnosis: Muscle weakness (generalized) (M62.81);Difficulty in walking, not elsewhere classified (R26.2)    Time: 8466-5993 PT Time Calculation (min) (ACUTE ONLY): 17 min   Charges:   PT Evaluation $PT Eval Low Complexity: 1 Low          Valentino Saxon, PT, DPT Physical Therapist with [facility]  05/28/2019 5:37 PM

## 2019-05-28 NOTE — Anesthesia Procedure Notes (Addendum)
Anesthesia Regional Block: Adductor canal block   Pre-Anesthetic Checklist: ,, timeout performed, Correct Patient, Correct Site, Correct Laterality, Correct Procedure, Correct Position, site marked, Risks and benefits discussed,  Surgical consent,  Pre-op evaluation,  At surgeon's request and post-op pain management  Laterality: Lower and Right  Prep: chloraprep       Needles:  Injection technique: Single-shot  Needle Type: Echogenic Needle     Needle Length: 9cm  Needle Gauge: 22     Additional Needles:   Procedures:,,,, ultrasound used (permanent image in chart),,,,  Narrative:  Start time: 05/28/2019 7:35 AM End time: 05/28/2019 7:41 AM Injection made incrementally with aspirations every 5 mL.  Performed by: Personally  Anesthesiologist: Barnet Glasgow, MD  Additional Notes: Block assessed prior to surgery. Pt tolerated procedure well.

## 2019-05-29 ENCOUNTER — Encounter: Payer: Self-pay | Admitting: *Deleted

## 2019-05-29 LAB — BASIC METABOLIC PANEL
Anion gap: 10 (ref 5–15)
BUN: 30 mg/dL — ABNORMAL HIGH (ref 8–23)
CO2: 20 mmol/L — ABNORMAL LOW (ref 22–32)
Calcium: 8.1 mg/dL — ABNORMAL LOW (ref 8.9–10.3)
Chloride: 106 mmol/L (ref 98–111)
Creatinine, Ser: 1.36 mg/dL — ABNORMAL HIGH (ref 0.44–1.00)
GFR calc Af Amer: 48 mL/min — ABNORMAL LOW (ref >60)
GFR calc non Af Amer: 41 mL/min — ABNORMAL LOW (ref >60)
Glucose, Bld: 204 mg/dL — ABNORMAL HIGH (ref 70–99)
Potassium: 5.1 mmol/L (ref 3.5–5.1)
Sodium: 136 mmol/L (ref 135–145)

## 2019-05-29 LAB — CBC
HCT: 28.7 % — ABNORMAL LOW (ref 36.0–46.0)
Hemoglobin: 8.8 g/dL — ABNORMAL LOW (ref 12.0–15.0)
MCH: 28.1 pg (ref 26.0–34.0)
MCHC: 30.7 g/dL (ref 30.0–36.0)
MCV: 91.7 fL (ref 80.0–100.0)
Platelets: 193 10*3/uL (ref 150–400)
RBC: 3.13 MIL/uL — ABNORMAL LOW (ref 3.87–5.11)
RDW: 13.4 % (ref 11.5–15.5)
WBC: 9.9 10*3/uL (ref 4.0–10.5)
nRBC: 0 % (ref 0.0–0.2)

## 2019-05-29 MED ORDER — ASPIRIN 81 MG PO CHEW: 81.0000 mg | CHEWABLE_TABLET | Freq: Two times a day (BID) | ORAL | 1 refills | Status: AC

## 2019-05-29 MED ORDER — DOCUSATE SODIUM 100 MG PO CAPS: 100.0000 mg | ORAL_CAPSULE | Freq: Two times a day (BID) | ORAL | 1 refills | Status: DC

## 2019-05-29 MED ORDER — ONDANSETRON HCL 4 MG PO TABS: 4.0000 mg | ORAL_TABLET | Freq: Four times a day (QID) | ORAL | 0 refills | Status: DC | PRN

## 2019-05-29 MED ORDER — HYDROCODONE-ACETAMINOPHEN 5-325 MG PO TABS: 1.0000 | ORAL_TABLET | ORAL | 0 refills | Status: DC | PRN

## 2019-05-29 MED ORDER — CELECOXIB 200 MG PO CAPS: 200.0000 mg | ORAL_CAPSULE | Freq: Every day | ORAL | 3 refills | Status: DC

## 2019-05-29 MED ORDER — SENNA 8.6 MG PO TABS: 1.0000 | ORAL_TABLET | Freq: Two times a day (BID) | ORAL | 0 refills | Status: DC

## 2019-05-29 NOTE — Progress Notes (Signed)
    Subjective:  Patient reports pain as mild.  Denies N/V/CP/SOB. No c/o.  Objective:   VITALS:   Vitals:   05/29/19 0123 05/29/19 0529 05/29/19 0610 05/29/19 0916  BP: 129/77 (!) 151/84  (!) 155/83  Pulse: 80 77 69 95  Resp: 14 14  16   Temp: 97.9 F (36.6 C) 98.1 F (36.7 C)  98.2 F (36.8 C)  TempSrc: Oral Oral  Oral  SpO2: 100% (!) 83% 100% 100%  Weight:      Height:        NAD ABD soft Sensation intact distally Intact pulses distally Dorsiflexion/Plantar flexion intact Incision: dressing C/D/I Compartment soft   Lab Results  Component Value Date   WBC 9.9 05/29/2019   HGB 8.8 (L) 05/29/2019   HCT 28.7 (L) 05/29/2019   MCV 91.7 05/29/2019   PLT 193 05/29/2019   BMET    Component Value Date/Time   NA 136 05/29/2019 0445   NA 142 10/17/2018 1412   K 5.1 05/29/2019 0445   CL 106 05/29/2019 0445   CO2 20 (L) 05/29/2019 0445   GLUCOSE 204 (H) 05/29/2019 0445   BUN 30 (H) 05/29/2019 0445   BUN 29 (H) 10/17/2018 1412   CREATININE 1.36 (H) 05/29/2019 0445   CALCIUM 8.1 (L) 05/29/2019 0445   GFRNONAA 41 (L) 05/29/2019 0445   GFRAA 48 (L) 05/29/2019 0445     Assessment/Plan: 1 Day Post-Op   Principal Problem:   Osteoarthritis of right knee   WBAT with walker DVT ppx: Aspirin, SCDs, TEDS PO pain control PT/OT Dispo: improving more rapidly than expected, D/C home with [place] 05/29/2019, 12:33 PM   Rod Can, MD [phone] [place] [facility] now [facility]  [place] [address], [place], [place] [address] Phone: [phone] [email or web address] Facebook  [employer]

## 2019-05-29 NOTE — Discharge Summary (Signed)
Physician Discharge Summary  Patient ID: Cassidy Green MRN: [id number] DOB/AGE: 64-Jul-1957 64 y.o.  Admit date: 05/28/2019 Discharge date: 05/29/2019  Admission Diagnoses:  Osteoarthritis of right knee  Discharge Diagnoses:  Principal Problem:   Osteoarthritis of right knee   Past Medical History:  Diagnosis Date  . Anemia   . Anxiety   . Arthritis   . CAD (coronary artery disease)    S/P CABG 05/2018  . Chronic kidney disease   . Diabetes mellitus without complication (HCC)    type II  . Dysrhythmia    post CABG  . Female bladder prolapse   . GERD (gastroesophageal reflux disease)   . Headache   . History of bronchitis   . History of pneumonia   . Hyperlipidemia   . Hypertension   . Joint inflammation   . Pneumonia     Surgeries: Procedure(s): COMPUTER ASSISTED TOTAL KNEE ARTHROPLASTY on 05/28/2019   Consultants (if any):   Discharged Condition: Improved  Hospital Course: Cassidy Green is an 63 y.o. female who was admitted 05/28/2019 with a diagnosis of Osteoarthritis of right knee and went to the operating room on 05/28/2019 and underwent the above named procedures.    She was given perioperative antibiotics:  Anti-infectives (From admission, onward)   Start     Dose/Rate Route Frequency Ordered Stop   05/28/19 1430  ceFAZolin (ANCEF) IVPB 2g/100 mL premix     2 g 200 mL/hr over 30 Minutes Intravenous Every 6 hours 05/28/19 1227 05/28/19 2047   05/28/19 0600  ceFAZolin (ANCEF) IVPB 2g/100 mL premix     2 g 200 mL/hr over 30 Minutes Intravenous On call to O.R. 05/28/19 4431 05/28/19 0746    .  She was given sequential compression devices, early ambulation, and ASA for DVT prophylaxis.  She benefited maximally from the hospital stay and there were no complications.    Recent vital signs:  Vitals:   05/29/19 0610 05/29/19 0916  BP:  (!) 155/83  Pulse: 69 95  Resp:  16  Temp:  98.2 F (36.8 C)  SpO2: 100% 100%    Recent laboratory studies:  Lab  Results  Component Value Date   HGB 8.8 (L) 05/29/2019   HGB 11.2 (L) 05/21/2019   HGB 11.5 07/03/2018   Lab Results  Component Value Date   WBC 9.9 05/29/2019   PLT 193 05/29/2019   Lab Results  Component Value Date   INR 1.1 05/21/2019   Lab Results  Component Value Date   NA 136 05/29/2019   K 5.1 05/29/2019   CL 106 05/29/2019   CO2 20 (L) 05/29/2019   BUN 30 (H) 05/29/2019   CREATININE 1.36 (H) 05/29/2019   GLUCOSE 204 (H) 05/29/2019    Discharge Medications:   Allergies as of 05/29/2019      Reactions   Statins Nausea And Vomiting, Other (See Comments)    " I cant recall the name of the statin, but I had joint pain with one of the statins "   Nsaids Other (See Comments)   CHRONIC KIDNEY DISEASE      Medication List    STOP taking these medications   aspirin 81 MG EC tablet Replaced by: aspirin 81 MG chewable tablet   traMADol 50 MG tablet Commonly known as: ULTRAM     TAKE these medications   aspirin 81 MG chewable tablet Chew 1 tablet (81 mg total) by mouth 2 (two) times daily. Replaces: aspirin 81 MG EC tablet  buPROPion 100 MG tablet Commonly known as: WELLBUTRIN Take 100 mg by mouth daily as needed (anxiety).   celecoxib 200 MG capsule Commonly known as: CELEBREX Take 1 capsule (200 mg total) by mouth daily.   docusate sodium 100 MG capsule Commonly known as: COLACE Take 1 capsule (100 mg total) by mouth 2 (two) times daily.   Evolocumab 140 MG/ML Soaj Commonly known as: Repatha SureClick Inject 952 mg into the skin every 14 (fourteen) days.   ezetimibe 10 MG tablet Commonly known as: ZETIA Take 1 tablet (10 mg total) by mouth at bedtime. What changed: when to take this   Fish Oil 1200 MG Caps Take 1,200 mg by mouth daily.   fluticasone 50 MCG/ACT nasal spray Commonly known as: FLONASE Place 1 spray into both nostrils daily.   folic acid 1 MG tablet Commonly known as: FOLVITE Take 1 mg by mouth every evening.    HYDROcodone-acetaminophen 5-325 MG tablet Commonly known as: NORCO/VICODIN Take 1-2 tablets by mouth every 4 (four) hours as needed for moderate pain (pain score 4-6).   loratadine 10 MG tablet Commonly known as: CLARITIN Take 1 tablet (10 mg total) by mouth daily.   metoprolol tartrate 50 MG tablet Commonly known as: Lopressor Take 1.5 tablets (75 mg total) by mouth 2 (two) times daily. What changed: how much to take   omeprazole 40 MG capsule Commonly known as: PRILOSEC Take 1 capsule (40 mg total) by mouth daily. What changed: when to take this   ondansetron 4 MG tablet Commonly known as: ZOFRAN Take 1 tablet (4 mg total) by mouth every 6 (six) hours as needed for nausea.   senna 8.6 MG Tabs tablet Commonly known as: SENOKOT Take 1 tablet (8.6 mg total) by mouth 2 (two) times daily.   sitaGLIPtin-metformin 50-1000 MG tablet Commonly known as: JANUMET Take 1 tablet by mouth 2 (two) times daily with a meal.   sodium bicarbonate 650 MG tablet Take 650 mg by mouth 2 (two) times daily.       Diagnostic Studies: DG Knee Right Port PACU  Result Date: 05/28/2019 CLINICAL DATA:  Status post right knee replacement today. EXAM: PORTABLE RIGHT KNEE - 1-2 VIEW COMPARISON:  Plain films right knee 03/04/2019. FINDINGS: New total knee arthroplasty is in place. No fracture or other acute abnormality. Gas in the soft tissues and surgical staples noted. IMPRESSION: Status post right knee replacement.  No acute finding. Electronically Signed   By: Inge Rise M.D.   On: 05/28/2019 11:17    Disposition: Discharge disposition: 01-Home or Self Care       Discharge Instructions    Call MD / Call 911   Complete by: As directed    If you experience chest pain or shortness of breath, CALL 911 and be transported to the hospital emergency room.  If you develope a fever above 101 F, pus (white drainage) or increased drainage or redness at the wound, or calf pain, call your surgeon's  office.   Constipation Prevention   Complete by: As directed    Drink plenty of fluids.  Prune juice may be helpful.  You may use a stool softener, such as Colace (over the counter) 100 mg twice a day.  Use MiraLax (over the counter) for constipation as needed.   Diet - low sodium heart healthy   Complete by: As directed    Do not put a pillow under the knee. Place it under the heel.   Complete by: As directed  Driving restrictions   Complete by: As directed    No driving for 6 weeks   Increase activity slowly as tolerated   Complete by: As directed    Lifting restrictions   Complete by: As directed    No lifting for 6 weeks   TED hose   Complete by: As directed    Use stockings (TED hose) for 6 weeks on both leg(s).  You may remove them at night for sleeping.      Follow-up Information    Starsky Nanna, Arlys JohnBrian, MD. Schedule an appointment as soon as possible for a visit in 2 weeks.   Specialty: Orthopedic Surgery Why: For wound re-check, For suture removal Contact information: [address] [place] [address] [phone]            Signed: Iline OvenBrian J Maty Zeisler 05/29/2019, 12:45 PM

## 2019-05-29 NOTE — Progress Notes (Signed)
Physical Therapy Treatment Patient Details Name: Cassidy Green MRN: [id number] DOB: 1955/09/11 Today's Date: 05/29/2019    History of Present Illness Patient is 63 y.o. female s/p Rt TKA on 05/28/19 with PMH significant for HTN, HLD, GERD, DM, CKD, CAD s/p CABG.    PT Comments    POD # 1 pm session Assisted OOB to amb again and practice stairs.  General stair comments: 50% VC's on proper sequencing and safety using 2 hands on one rail.  Then returned to room to perform some TE's following HEP handout.  Instructed on proper tech, freq as well as use of ICE.   Addressed all mobility questions, discussed appropriate activity, educated on use of ICE.  Pt ready for D/C to home.   Follow Up Recommendations  Follow surgeon's recommendation for DC plan and follow-up therapies     Equipment Recommendations  None recommended by PT    Recommendations for Other Services       Precautions / Restrictions Precautions Precautions: Fall Precaution Comments: instructed no pillow under knee Restrictions Weight Bearing Restrictions: No Other Position/Activity Restrictions: WBAT    Mobility  Bed Mobility Overal bed mobility: Needs Assistance Bed Mobility: Supine to Sit;Sit to Supine     Supine to sit: Supervision;Min guard Sit to supine: Supervision;Min guard   General bed mobility comments: demonstarted and instructed how to use a belt loop to self assist LE off/onto bed  Transfers Overall transfer level: Needs assistance Equipment used: Rolling walker (2 wheeled) Transfers: Sit to/from [employer] Sit to Stand: Supervision Stand pivot transfers: Supervision;Min guard       General transfer comment: <25% VC's on proper hand placement and safety with turns  Ambulation/Gait Ambulation/Gait assistance: Supervision;Min guard Gait Distance (Feet): 22 Feet Assistive device: Rolling walker (2 wheeled) Gait Pattern/deviations: Step-through pattern;Decreased stride  length;Decreased stance time - right Gait velocity: decreased but functional   General Gait Details: <25% VC's on proper walker to self distance and safety with turns   Stairs Stairs: Yes Stairs assistance: Min guard Stair Management: One rail Right;Step to pattern;Sideways;Forwards Number of Stairs: 2 General stair comments: 50% VC's on proper sequencing and safety using 2 hands on one rail   Wheelchair Mobility    Modified Rankin (Stroke Patients Only)       Balance                                            Cognition Arousal/Alertness: Awake/alert Behavior During Therapy: WFL for tasks assessed/performed Overall Cognitive Status: Within Functional Limits for tasks assessed                                 General Comments: motivated, had THR before and knowledgable      Exercises  5 reps seated knee bends 5 reps seated LAQ's 5 reps supine SLR 5 reps supine ABD 5 reps supine SAQ's Followed by ICE    General Comments        Pertinent Vitals/Pain Pain Assessment: 0-10 Pain Score: 5  Pain Location: Rt knee Pain Descriptors / Indicators: Aching;Sore;Tightness;Tender;Operative site guarding Pain Intervention(s): Monitored during session;Premedicated before session;Repositioned;Ice applied    Home Living                      Prior Function  PT Goals (current goals can now be found in the care plan section) Progress towards PT goals: Progressing toward goals    Frequency    7X/week      PT Plan Current plan remains appropriate    Co-evaluation              AM-PAC PT "6 Clicks" Mobility   Outcome Measure  Help needed turning from your back to your side while in a flat bed without using bedrails?: A Little Help needed moving from lying on your back to sitting on the side of a flat bed without using bedrails?: A Little Help needed moving to and from a bed to a chair (including a  wheelchair)?: A Little Help needed standing up from a chair using your arms (e.g., wheelchair or bedside chair)?: A Little Help needed to walk in hospital room?: A Little Help needed climbing 3-5 steps with a railing? : A Little 6 Click Score: 18    End of Session Equipment Utilized During Treatment: Gait belt Activity Tolerance: Patient tolerated treatment well Patient left: in bed;with call bell/phone within reach;with bed alarm set Nurse Communication: Mobility status PT Visit Diagnosis: Muscle weakness (generalized) (M62.81);Difficulty in walking, not elsewhere classified (R26.2)     Time: 4332-9518 PT Time Calculation (min) (ACUTE ONLY): 27 min  Charges:  $Gait Training: 8-22 mins $Therapeutic Activity: 8-22 mins                     Felecia Shelling  PTA Acute  Rehabilitation Services Pager      [phone] Office      [phone] `

## 2019-05-29 NOTE — Progress Notes (Signed)
Physical Therapy Treatment Patient Details Name: Cassidy Green MRN: [id number] DOB: [birth year] Today's Date: 05/29/2019    History of Present Illness Patient is 63 y.o. female s/p Rt TKA on 05/28/19 with PMH significant for HTN, HLD, GERD, DM, CKD, CAD s/p CABG.    PT Comments    POD # 1 am session.  Assisted OOB.  General bed mobility comments: demonstarted and instructed how to use a belt loop to self assist LE off/onto bed.  Assisted to bathroom.  General transfer comment: <25% VC's on proper hand placement and safety with turns.  General Gait Details: <25% VC's on proper walker to self distance and safety with turns.  Then returned to room to perform some TE's following HEP handout.  Instructed on proper tech, freq as well as use of ICE.     Follow Up Recommendations  Follow surgeon's recommendation for DC plan and follow-up therapies     Equipment Recommendations  None recommended by PT    Recommendations for Other Services       Precautions / Restrictions Precautions Precautions: Fall Precaution Comments: instructed no pillow under knee Restrictions Weight Bearing Restrictions: No Other Position/Activity Restrictions: WBAT    Mobility  Bed Mobility Overal bed mobility: Needs Assistance Bed Mobility: Supine to Sit;Sit to Supine     Supine to sit: Supervision;Min guard Sit to supine: Supervision;Min guard   General bed mobility comments: demonstarted and instructed how to use a belt loop to self assist LE off/onto bed  Transfers Overall transfer level: Needs assistance Equipment used: Rolling walker (2 wheeled) Transfers: Sit to/from [employer] Sit to Stand: Supervision Stand pivot transfers: Supervision;Min guard       General transfer comment: <25% VC's on proper hand placement and safety with turns  Ambulation/Gait Ambulation/Gait assistance: Supervision;Min guard Gait Distance (Feet): 45 Feet Assistive device: Rolling walker (2  wheeled) Gait Pattern/deviations: Step-through pattern;Decreased stride length;Decreased stance time - right Gait velocity: decreased but functional   General Gait Details: <25% VC's on proper walker to self distance and safety with turns   Marine scientist Rankin (Stroke Patients Only)       Balance                                            Cognition Arousal/Alertness: Awake/alert Behavior During Therapy: WFL for tasks assessed/performed Overall Cognitive Status: Within Functional Limits for tasks assessed                                 General Comments: motivated, had THR before and knowledgable      Exercises   Total Knee Replacement TE's 10 reps B LE ankle pumps 10 reps towel squeezes  5 reps knee presses  5 reps heel slides    Followed by ICE     General Comments        Pertinent Vitals/Pain Pain Assessment: 0-10 Pain Score: 5  Pain Location: Rt knee Pain Descriptors / Indicators: Aching;Sore;Tightness;Tender;Operative site guarding Pain Intervention(s): Monitored during session;Premedicated before session;Repositioned;Ice applied    Home Living                      Prior Function  PT Goals (current goals can now be found in the care plan section) Progress towards PT goals: Progressing toward goals    Frequency    7X/week      PT Plan Current plan remains appropriate    Co-evaluation              AM-PAC PT "6 Clicks" Mobility   Outcome Measure  Help needed turning from your back to your side while in a flat bed without using bedrails?: A Little Help needed moving from lying on your back to sitting on the side of a flat bed without using bedrails?: A Little Help needed moving to and from a bed to a chair (including a wheelchair)?: A Little Help needed standing up from a chair using your arms (e.g., wheelchair or bedside chair)?: A  Little Help needed to walk in hospital room?: A Little Help needed climbing 3-5 steps with a railing? : A Little 6 Click Score: 18    End of Session Equipment Utilized During Treatment: Gait belt Activity Tolerance: Patient tolerated treatment well Patient left: in bed;with call bell/phone within reach;with bed alarm set Nurse Communication: Mobility status PT Visit Diagnosis: Muscle weakness (generalized) (M62.81);Difficulty in walking, not elsewhere classified (R26.2)     Time: 7353-2992 PT Time Calculation (min) (ACUTE ONLY): 26 min  Charges:  $Gait Training: 8-22 mins $Therapeutic Exercise: 8-22 mins                     Felecia Shelling  PTA Acute  Rehabilitation Services Pager      [phone] Office      [phone]

## 2019-07-16 ENCOUNTER — Other Ambulatory Visit: Payer: Self-pay | Admitting: Pharmacist

## 2019-07-16 MED ORDER — REPATHA SURECLICK 140 MG/ML ~~LOC~~ SOAJ: 140.0000 mg | SUBCUTANEOUS | 11 refills | Status: DC

## 2019-07-19 ENCOUNTER — Ambulatory Visit: Payer: Managed Care, Other (non HMO) | Attending: Internal Medicine

## 2019-07-19 DIAGNOSIS — Z23 Encounter for immunization: Secondary | ICD-10-CM | POA: Insufficient documentation

## 2019-07-19 NOTE — Progress Notes (Signed)
   Covid-19 Vaccination Clinic  Name:  Cassidy Green    MRN: [id number] DOB: 03/29/1956  07/19/2019  [name] was observed post Covid-19 immunization for 15 minutes without incidence. She was provided with Vaccine Information Sheet and instruction to access the V-Safe system.   [name] was instructed to call 911 with any severe reactions post vaccine: Marland Kitchen Difficulty breathing  . Swelling of your face and throat  . A fast heartbeat  . A bad rash all over your body  . Dizziness and weakness    Immunizations Administered    Name Date Dose VIS Date Route   Pfizer COVID-19 Vaccine 07/19/2019  1:35 PM 0.3 mL 05/09/2019 Intramuscular   Manufacturer: [employer], [employer]   Lot: [id number]   NDC: [address]-2

## 2019-08-12 ENCOUNTER — Ambulatory Visit: Payer: Managed Care, Other (non HMO) | Attending: Internal Medicine

## 2019-08-12 DIAGNOSIS — Z23 Encounter for immunization: Secondary | ICD-10-CM

## 2019-08-12 NOTE — Progress Notes (Signed)
   Covid-19 Vaccination Clinic  Name:  Cassidy Green    MRN: [id number] DOB: Sep 26, 1955  08/12/2019  [name] was observed post Covid-19 immunization for 15 minutes without incident. She was provided with Vaccine Information Sheet and instruction to access the V-Safe system.   [name] was instructed to call 911 with any severe reactions post vaccine: Marland Kitchen Difficulty breathing  . Swelling of face and throat  . A fast heartbeat  . A bad rash all over body  . Dizziness and weakness   Immunizations Administered    Name Date Dose VIS Date Route   Pfizer COVID-19 Vaccine 08/12/2019  2:04 PM 0.3 mL 05/09/2019 Intramuscular   Manufacturer: [employer], [employer]   Lot: [id number]   NDC: [address]-2

## 2019-09-30 ENCOUNTER — Other Ambulatory Visit: Payer: Self-pay | Admitting: Cardiology

## 2019-10-30 ENCOUNTER — Other Ambulatory Visit: Payer: Self-pay

## 2019-10-30 ENCOUNTER — Ambulatory Visit: Payer: Managed Care, Other (non HMO) | Admitting: Cardiology

## 2019-10-30 ENCOUNTER — Encounter: Payer: Self-pay | Admitting: Cardiology

## 2019-10-30 ENCOUNTER — Telehealth: Payer: Self-pay

## 2019-10-30 VITALS — BP 140/70 | HR 80 | Ht 61.0 in | Wt 140.4 lb

## 2019-10-30 DIAGNOSIS — E875 Hyperkalemia: Secondary | ICD-10-CM

## 2019-10-30 DIAGNOSIS — I2510 Atherosclerotic heart disease of native coronary artery without angina pectoris: Secondary | ICD-10-CM

## 2019-10-30 DIAGNOSIS — E119 Type 2 diabetes mellitus without complications: Secondary | ICD-10-CM

## 2019-10-30 DIAGNOSIS — N183 Chronic kidney disease, stage 3 unspecified: Secondary | ICD-10-CM

## 2019-10-30 DIAGNOSIS — Z79899 Other long term (current) drug therapy: Secondary | ICD-10-CM

## 2019-10-30 DIAGNOSIS — E1122 Type 2 diabetes mellitus with diabetic chronic kidney disease: Secondary | ICD-10-CM | POA: Diagnosis not present

## 2019-10-30 DIAGNOSIS — Z951 Presence of aortocoronary bypass graft: Secondary | ICD-10-CM

## 2019-10-30 DIAGNOSIS — E785 Hyperlipidemia, unspecified: Secondary | ICD-10-CM

## 2019-10-30 DIAGNOSIS — I10 Essential (primary) hypertension: Secondary | ICD-10-CM

## 2019-10-30 LAB — LIPID PANEL
Chol/HDL Ratio: 3.2 ratio (ref 0.0–4.4)
Cholesterol, Total: 126 mg/dL (ref 100–199)
HDL: 40 mg/dL (ref >39)
LDL Chol Calc (NIH): 43 mg/dL (ref 0–99)
Triglycerides: 285 mg/dL — ABNORMAL HIGH (ref 0–149)
VLDL Cholesterol Cal: 43 mg/dL — ABNORMAL HIGH (ref 5–40)

## 2019-10-30 LAB — COMPREHENSIVE METABOLIC PANEL
ALT: 5 IU/L (ref 0–32)
AST: 19 IU/L (ref 0–40)
Albumin/Globulin Ratio: 2.3 — ABNORMAL HIGH (ref 1.2–2.2)
Albumin: 4.6 g/dL (ref 3.8–4.8)
Alkaline Phosphatase: 88 IU/L (ref 48–121)
BUN/Creatinine Ratio: 13 (ref 12–28)
BUN: 21 mg/dL (ref 8–27)
Bilirubin Total: <0.2 mg/dL (ref 0.0–1.2)
CO2: 15 mmol/L — ABNORMAL LOW (ref 20–29)
Calcium: 9.2 mg/dL (ref 8.7–10.3)
Chloride: 110 mmol/L — ABNORMAL HIGH (ref 96–106)
Creatinine, Ser: 1.67 mg/dL — ABNORMAL HIGH (ref 0.57–1.00)
GFR calc Af Amer: 37 mL/min/{1.73_m2} — ABNORMAL LOW (ref >59)
GFR calc non Af Amer: 32 mL/min/{1.73_m2} — ABNORMAL LOW (ref >59)
Globulin, Total: 2 g/dL (ref 1.5–4.5)
Glucose: 82 mg/dL (ref 65–99)
Potassium: 5.8 mmol/L (ref 3.5–5.2)
Sodium: 142 mmol/L (ref 134–144)
Total Protein: 6.6 g/dL (ref 6.0–8.5)

## 2019-10-30 LAB — CBC
Hematocrit: 31.9 % — ABNORMAL LOW (ref 34.0–46.6)
Hemoglobin: 10 g/dL — ABNORMAL LOW (ref 11.1–15.9)
MCH: 24.9 pg — ABNORMAL LOW (ref 26.6–33.0)
MCHC: 31.3 g/dL — ABNORMAL LOW (ref 31.5–35.7)
MCV: 79 fL (ref 79–97)
Platelets: 260 10*3/uL (ref 150–450)
RBC: 4.02 x10E6/uL (ref 3.77–5.28)
RDW: 18.1 % — ABNORMAL HIGH (ref 11.7–15.4)
WBC: 7.2 10*3/uL (ref 3.4–10.8)

## 2019-10-30 MED ORDER — AMLODIPINE BESYLATE 5 MG PO TABS: 5.0000 mg | ORAL_TABLET | Freq: Every day | ORAL | 3 refills | Status: DC

## 2019-10-30 NOTE — Patient Instructions (Signed)
Medication Instructions:  Please start Amlodipine 5 mg once a day.  Continue all other medications as listed.  *If you need a refill on your cardiac medications before your next appointment, please call your pharmacy*  Lab Work: Please have blood work today (Lipid, CMP, CBC) If you have labs (blood work) drawn today and your tests are completely normal, you will receive your results only by: Marland Kitchen MyChart Message (if you have MyChart) OR . A paper copy in the mail If you have any lab test that is abnormal or we need to change your treatment, we will call you to review the results.  Follow-Up: At [facility], you and your health needs are our priority.  As part of our continuing mission to provide you with exceptional heart care, we have created designated Provider Care Teams.  These Care Teams include your primary Cardiologist (physician) and Advanced Practice Providers (APPs -  Physician Assistants and Nurse Practitioners) who all work together to provide you with the care you need, when you need it.  We recommend signing up for the patient portal called "MyChart".  Sign up information is provided on this After Visit Summary.  MyChart is used to connect with patients for Virtual Visits (Telemedicine).  Patients are able to view lab/test results, encounter notes, upcoming appointments, etc.  Non-urgent messages can be sent to your provider as well.   To learn more about what you can do with MyChart, go to [email or web address].    Your next appointment:   12 month(s)  The format for your next appointment:   In Person  Provider:   Donato Schultz, MD   Thank you for choosing [facility]!!

## 2019-10-30 NOTE — Progress Notes (Signed)
Cardiology Office Note:    Date:  10/30/2019   ID:  Cassidy Green, DOB [birth year], MRN [id number]  PCP:  Myrlene Broker, MD  Cardiologist:  Candee Furbish, MD  Electrophysiologist:  None   Referring MD: Bonnita Nasuti, MD     History of Present Illness:    Cassidy Green is a 64 y.o. female here for the follow-up of coronary artery disease, statin intolerance on Repatha.  Had right total knee in late December with Dr. Lyla Glassing.  No chest pain no shortness of breath.  Prior postop A. fib with CABG, amiodarone caused nausea, stopped.  No anticoagulation.    Past Medical History:  Diagnosis Date  . Anemia   . Anxiety   . Arthritis   . CAD (coronary artery disease)    S/P CABG 05/2018  . Chronic kidney disease   . Diabetes mellitus without complication ([place])    type II  . Dysrhythmia    post CABG  . Female bladder prolapse   . GERD (gastroesophageal reflux disease)   . Headache   . History of bronchitis   . History of pneumonia   . Hyperlipidemia   . Hypertension   . Joint inflammation   . Pneumonia     Past Surgical History:  Procedure Laterality Date  . ABDOMINAL HYSTERECTOMY  1992  . COLONOSCOPY    . CORONARY ARTERY BYPASS GRAFT N/A 06/14/2018   Procedure: CORONARY ARTERY BYPASS GRAFTING (CABG) x4  using left internal mammary artery and right greater saphenous vein harvested endoscopically.;  Surgeon: Melrose Nakayama, MD;  Location: [place];  Service: Open Heart Surgery;  Laterality: N/A;  . INCISION AND DRAINAGE HIP Left 09/04/2014   Procedure: IRRIGATION AND DEBRIDEMENT  LEFT HIP WOUND WITH CLOSURE ;  Surgeon: Rod Can, MD;  Location: [place];  Service: Orthopedics;  Laterality: Left;  . kidney stones    . KNEE ARTHROPLASTY Right 05/28/2019   Procedure: COMPUTER ASSISTED TOTAL KNEE ARTHROPLASTY;  Surgeon: Rod Can, MD;  Location: [facility];  Service: Orthopedics;  Laterality: Right;  . LEFT HEART CATH AND CORONARY ANGIOGRAPHY N/A 06/12/2018   Procedure: LEFT HEART CATH AND CORONARY ANGIOGRAPHY;  Surgeon: Troy Sine, MD;  Location: [place] [facility];  Service: Cardiovascular;  Laterality: N/A;  . LUMBAR LAMINECTOMY/DECOMPRESSION MICRODISCECTOMY  03/11/2015   Procedure: Lumbar Four-Five DECOMPRESSION  AND REMOVAL SYNOVAL CYST;  Surgeon: Melina Schools, MD;  Location: [place];  Service: Orthopedics;;  . TEE WITHOUT CARDIOVERSION N/A 06/14/2018   Procedure: TRANSESOPHAGEAL ECHOCARDIOGRAM (TEE);  Surgeon: Melrose Nakayama, MD;  Location: [place];  Service: Open Heart Surgery;  Laterality: N/A;  . TOTAL HIP ARTHROPLASTY Left 07/30/2014   Procedure: LEFT TOTAL HIP ARTHROPLASTY ANTERIOR APPROACH;  Surgeon: Elie Goody, MD;  Location: [facility];  Service: Orthopedics;  Laterality: Left;  . TUBAL LIGATION    . ULTRASOUND GUIDANCE FOR VASCULAR ACCESS  06/12/2018   Procedure: Ultrasound Guidance For Vascular Access;  Surgeon: Troy Sine, MD;  Location: [place] [facility];  Service: Cardiovascular;;    Current Medications: Current Meds  Medication Sig  . buPROPion (WELLBUTRIN) 100 MG tablet Take 100 mg by mouth daily as needed (anxiety).   . Evolocumab (REPATHA SURECLICK) 694 MG/ML SOAJ Inject 140 mg into the skin every 14 (fourteen) days.  Marland Kitchen ezetimibe (ZETIA) 10 MG tablet TAKE ONE TABLET BY MOUTH AT BEDTIME   . fluticasone (FLONASE) 50 MCG/ACT nasal spray Place 1 spray into both nostrils daily.  . folic  acid (FOLVITE) 1 MG tablet Take 1 mg by mouth every evening.   Marland Kitchen HYDROcodone-acetaminophen (NORCO/VICODIN) 5-325 MG tablet Take 1-2 tablets by mouth every 4 (four) hours as needed for moderate pain (pain score 4-6).  Marland Kitchen loratadine (CLARITIN) 10 MG tablet Take 1 tablet (10 mg total) by mouth daily.  . metoprolol tartrate (LOPRESSOR) 50 MG tablet TAKE ONE (1) AND ONE-HALF (1/2) TABLETS BY MOUTH TWICE DAILY   . omeprazole (PRILOSEC) 40 MG capsule Take 1 capsule (40 mg total) by mouth daily.  . sitaGLIPtin-metformin (JANUMET) 50-1000  MG per tablet Take 1 tablet by mouth 2 (two) times daily with a meal.      Allergies:   Statins and Nsaids   Social History   Socioeconomic History  . Marital status: Married    Spouse name: Not on file  . Number of children: Not on file  . Years of education: Not on file  . Highest education level: Not on file  Occupational History  . Not on file  Tobacco Use  . Smoking status: Former Smoker    Quit date: 02/26/2001    Years since quitting: 18.6  . Smokeless tobacco: Never Used  Substance and Sexual Activity  . Alcohol use: Yes    Comment: OCCASIONAL  . Drug use: No  . Sexual activity: Not on file  Other Topics Concern  . Not on file  Social History Narrative  . Not on file   Social Determinants of Health   Financial Resource Strain:   . Difficulty of Paying Living Expenses:   Food Insecurity:   . Worried About Programme researcher, broadcasting/film/video in the Last Year:   . Barista in the Last Year:   Transportation Needs:   . Freight forwarder (Medical):   Marland Kitchen Lack of Transportation (Non-Medical):   Physical Activity:   . Days of Exercise per Week:   . Minutes of Exercise per Session:   Stress:   . Feeling of Stress :   Social Connections:   . Frequency of Communication with Friends and Family:   . Frequency of Social Gatherings with Friends and Family:   . Attends Religious Services:   . Active Member of Clubs or Organizations:   . Attends Banker Meetings:   Marland Kitchen Marital Status:        EKGs/Labs/Other Studies Reviewed:    The following studies were reviewed today:  CABG X 4 06/14/2017 with   -[name] to MID LAD -SVG to DISTAL LAD -SEQ SVG to DIAGONAL 1 and OM 1  Echo 06/12/18 Study Conclusions  - Left ventricle: The cavity size was normal. Wall thickness was normal. Systolic function was normal. The estimated ejection fraction was in the range of 50% to 55%. Moderate hypokinesis of the basalinferior myocardium. Features are consistent with  a pseudonormal left ventricular filling pattern, with concomitant abnormal relaxation and increased filling pressure (grade 2 diastolic dysfunction).   Recent Labs: 05/21/2019: ALT 18 05/29/2019: BUN 30; Creatinine, Ser 1.36; Hemoglobin 8.8; Platelets 193; Potassium 5.1; Sodium 136  Recent Lipid Panel    Component Value Date/Time   CHOL 108 10/08/2018 1637   TRIG 186 (H) 10/08/2018 1637   HDL 36 (L) 10/08/2018 1637   CHOLHDL 3.0 10/08/2018 1637   CHOLHDL 13.0 06/12/2018 0443   VLDL UNABLE TO CALCULATE IF TRIGLYCERIDE OVER 400 mg/dL 30/86/5784 6962   LDLCALC 35 10/08/2018 1637   LDLDIRECT 198 (H) 06/12/2018 0443    Physical Exam:    VS:  BP 140/70   Pulse 80   Ht 5\' 1"  (1.549 m)   Wt 140 lb 6.4 oz (63.7 kg)   SpO2 96%   BMI 26.53 kg/m     Wt Readings from Last 3 Encounters:  10/30/19 140 lb 6.4 oz (63.7 kg)  05/28/19 144 lb 9.6 oz (65.6 kg)  05/21/19 144 lb 9.6 oz (65.6 kg)     GEN:  Well nourished, well developed in no acute distress HEENT: Normal NECK: No JVD; No carotid bruits LYMPHATICS: No lymphadenopathy CARDIAC: RRR, no murmurs, rubs, gallops RESPIRATORY:  Clear to auscultation without rales, wheezing or rhonchi  ABDOMEN: Soft, non-tender, non-distended MUSCULOSKELETAL:  No edema; No deformity  SKIN: Warm and dry NEUROLOGIC:  Alert and oriented x 3 PSYCHIATRIC:  Normal affect   ASSESSMENT:    1. CAD in native artery   2. S/P CABG (coronary artery bypass graft)   3. CKD stage 3 due to type 2 diabetes mellitus (HCC)   4. Diabetes mellitus with coincident hypertension (HCC)   5. Long-term use of high-risk medication   6. Dyslipidemia (high LDL; low HDL)    PLAN:    In order of problems listed above:  CAD post CABG -Previous abnormal stress test, cath with triple-vessel disease, CABG x4 as described above.  Aspirin beta-blocker Repatha, Zetia.  Diabetes control.  Diabetes with hypertension hyperlipidemia -Tolerating Repatha.  Could not take  statins secondary to side effects.  Appreciate pharmacy team.  LDL goal less than 70.  Last check in May 2020 in our office 35.  We will go ahead and check lab work today. -Blood pressure in the 140s systolic, we will add amlodipine 5 mg once a day.  She is already taking metoprolol.   Chronic kidney disease stage III -Creatinine last in December 2020 was 1.36, hemoglobin at that time was 8.8 down from 11.2.  This was surrounding her knee surgery.  Creatinine in May 2020 was 2.06.  Rechecking labs.   Medication Adjustments/Labs and Tests Ordered: Current medicines are reviewed at length with the patient today.  Concerns regarding medicines are outlined above.  Orders Placed This Encounter  Procedures  . CBC  . Comprehensive metabolic panel  . Lipid panel   Meds ordered this encounter  Medications  . amLODipine (NORVASC) 5 MG tablet    Sig: Take 1 tablet (5 mg total) by mouth daily.    Dispense:  90 tablet    Refill:  3    Patient Instructions  Medication Instructions:  Please start Amlodipine 5 mg once a day.  Continue all other medications as listed.  *If you need a refill on your cardiac medications before your next appointment, please call your pharmacy*  Lab Work: Please have blood work today (Lipid, CMP, CBC) If you have labs (blood work) drawn today and your tests are completely normal, you will receive your results only by: June 2020 MyChart Message (if you have MyChart) OR . A paper copy in the mail If you have any lab test that is abnormal or we need to change your treatment, we will call you to review the results.  Follow-Up: At [facility], you and your health needs are our priority.  As part of our continuing mission to provide you with exceptional heart care, we have created designated Provider Care Teams.  These Care Teams include your primary Cardiologist (physician) and Advanced Practice Providers (APPs -  Physician Assistants and Nurse Practitioners) who all work  together to provide you with the care  you need, when you need it.  We recommend signing up for the patient portal called "MyChart".  Sign up information is provided on this After Visit Summary.  MyChart is used to connect with patients for Virtual Visits (Telemedicine).  Patients are able to view lab/test results, encounter notes, upcoming appointments, etc.  Non-urgent messages can be sent to your provider as well.   To learn more about what you can do with MyChart, go to [email or web address].    Your next appointment:   12 month(s)  The format for your next appointment:   In Person  Provider:   Donato Schultz, MD   Thank you for choosing [facility]!!        Signed, Donato Schultz, MD  10/30/2019 10:58 AM    [facility]

## 2019-10-30 NOTE — Telephone Encounter (Signed)
Per Dr. Anne Fu, DOD, pt should come back in the morning for a redraw. She agrees and will be back in the morning.

## 2019-10-30 NOTE — Telephone Encounter (Signed)
Critical lab called by Clydie Braun with [employer].  Potassium level of 5.8.  Will report to DOD.

## 2019-10-31 ENCOUNTER — Other Ambulatory Visit: Payer: Managed Care, Other (non HMO) | Admitting: *Deleted

## 2019-10-31 DIAGNOSIS — E875 Hyperkalemia: Secondary | ICD-10-CM

## 2019-11-01 LAB — BASIC METABOLIC PANEL
BUN/Creatinine Ratio: 15 (ref 12–28)
BUN: 27 mg/dL (ref 8–27)
CO2: 17 mmol/L — ABNORMAL LOW (ref 20–29)
Calcium: 8.7 mg/dL (ref 8.7–10.3)
Chloride: 109 mmol/L — ABNORMAL HIGH (ref 96–106)
Creatinine, Ser: 1.76 mg/dL — ABNORMAL HIGH (ref 0.57–1.00)
GFR calc Af Amer: 35 mL/min/{1.73_m2} — ABNORMAL LOW (ref >59)
GFR calc non Af Amer: 30 mL/min/{1.73_m2} — ABNORMAL LOW (ref >59)
Glucose: 105 mg/dL — ABNORMAL HIGH (ref 65–99)
Potassium: 4.9 mmol/L (ref 3.5–5.2)
Sodium: 140 mmol/L (ref 134–144)

## 2019-12-12 ENCOUNTER — Encounter: Payer: Self-pay | Admitting: Pharmacist

## 2019-12-24 NOTE — Telephone Encounter (Signed)
Error

## 2020-02-11 ENCOUNTER — Telehealth: Payer: Self-pay | Admitting: Cardiology

## 2020-02-11 NOTE — Telephone Encounter (Signed)
Pt c/o medication issue:  1. Name of Medication: Evolocumab (REPATHA SURECLICK) 140 MG/ML SOAJ  2. How are you currently taking this medication (dosage and times per day)? Injection every 14 days   3. Are you having a reaction (difficulty breathing--STAT)? No   4. What is your medication issue? Cassidy Green is calling stating she has switched over her insurance to Medicare and they have some questions for Dr. Anne Fu in regards to the medication in order for it to be covered by them. Please advise.

## 2020-02-11 NOTE — Telephone Encounter (Signed)
Returned call to pt. She just recently changed to [employer].  Member ID: [id number] BIN: [id number] PCN: [id number] GRP: [id number]

## 2020-02-11 NOTE — Telephone Encounter (Signed)
PA SUBMITTED.

## 2020-02-12 NOTE — Telephone Encounter (Signed)
[facility] called in with the below info Repatha was approved  Approval code -[id number] Effective sept 15th to August 11 2020.

## 2020-02-12 NOTE — Telephone Encounter (Signed)
Called and lmomed the pt that the repatha was approved

## 2020-06-22 ENCOUNTER — Other Ambulatory Visit: Payer: Self-pay | Admitting: Cardiology

## 2020-07-26 ENCOUNTER — Other Ambulatory Visit: Payer: Self-pay | Admitting: Cardiology

## 2020-10-28 ENCOUNTER — Other Ambulatory Visit: Payer: Self-pay | Admitting: Cardiology

## 2020-11-03 ENCOUNTER — Other Ambulatory Visit: Payer: Self-pay | Admitting: Cardiology

## 2020-12-10 ENCOUNTER — Ambulatory Visit: Payer: Managed Care, Other (non HMO) | Admitting: Cardiology

## 2021-02-02 ENCOUNTER — Other Ambulatory Visit: Payer: Self-pay | Admitting: Cardiology

## 2021-03-17 NOTE — Progress Notes (Signed)
Cardiology Office Note:    Date:  03/18/2021   ID:  Cassidy Green, DOB [birth year], MRN [id number]  PCP:  Hadley Pen, MD   [facility] Providers Cardiologist:  Donato Schultz, MD    Referring MD: Hadley Pen, MD   Chief Complaint:  Follow-up for CAD     ASSESSMENT & PLAN:   CAD (coronary artery disease) S/p CABG in 2020 c/b post op AFib.  She is currently doing well without anginal symptoms.  She was intolerant to statins.  She remains on evolocumab 140 mg every 14 days and ezetimibe 10 mg daily along with metoprolol tartrate 75 mg twice daily.  Continue aspirin 81 mg daily.  Follow-up 1 year  CKD stage 3 due to type 2 diabetes mellitus (HCC) Followed by Nephrology at [facility].  Last Creatinine stable at 1.42.  Hyperlipidemia LDL goal <70 Most recent Lipid Panel with primary care was personally reviewed and demonstrates optimal LDL (50).  Triglycerides are somewhat elevated (219).  Total cholesterol was 121, HDL 43.  Continue to work on diet to reduce Triglycerides.  Continue evolocumab 140 mg every 14 days and ezetimibe 10 mg daily.  Essential hypertension Blood pressure is well controlled on a combination of amlodipine 5 mg daily and metoprolol tartrate 75 mg twice daily.      Patient Profile:   Cassidy Green is a 65 y.o. 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  The mid LAD is subtotal/totally occluded and there is collateralization from septal perforators supplying the apical portion of the LAD which wraps around the apex.  There is also collateralization to the distal RCA via the left coronary  injection ;  70% proximal eccentric circumflex stenosis; right coronary artery with diffuse mid 50% narrowing followed by diffuse 80% stenosis beyond the acute margin and 99% PDA stenosis with evidence for both antegrade and retrograde filling of the distal PDA. Mild LV dysfunction with EF at 50% with focal mild basal inferior hypocontractility.  LVEDP 17 mm. RECOMMENDATION: Surgical consultation for consideration of CABG revascularization.  In light of the patient's significant disease, she will be restarted on heparin 8 hours post sheath discontinuance.  If patient is intolerant to statins, consider alternative such as Zetia and possible PCSK9 inhibition.   ECHO COMPLETE WO IMAGING ENHANCING AGENT 06/12/2018 EF 50-55, inferior HK, GRII DD    Pre CABG Dopplers 06/13/2018 No sig ICA stenosis  History of Present Illness: Cassidy Green was last seen by Dr. Anne Fu in 6/21.  She returns for f/u.  She is here alone.  She has been doing well without chest pain, shortness of breath, syncope, orthopnea, leg edema.        Past Medical History:  Diagnosis Date   Anemia    Anxiety    Arthritis    CAD (coronary artery disease)    S/P CABG 05/2018   Chronic kidney disease    Diabetes mellitus without complication (HCC)    type II   Dysrhythmia    post CABG   Female bladder prolapse    GERD (gastroesophageal reflux disease)    Headache  History of bronchitis    History of pneumonia    Hyperlipidemia    Hypertension    Joint inflammation    Pneumonia    Current Medications: Current Meds  Medication Sig   amoxicillin (AMOXIL) 500 MG capsule amoxicillin 500 mg capsule  TAKE 4 CAPSULES BY MOUTH 1 HOUR PRIOR TO DENTAL APPOINTMENT   aspirin EC 81 MG tablet Take 1 tablet (81 mg total) by mouth daily. Swallow whole.   buPROPion (WELLBUTRIN) 100 MG tablet Take 100 mg by mouth daily as needed (anxiety).    ezetimibe (ZETIA) 10 MG tablet Take 1 tablet by mouth at bedtime   ferrous sulfate 325 (65 FE)  MG EC tablet Take 325 mg by mouth daily.   fluticasone (FLONASE) 50 MCG/ACT nasal spray Place 1 spray into both nostrils daily.   folic acid (FOLVITE) 1 MG tablet Take 1 mg by mouth every evening.    HYDROcodone-acetaminophen (NORCO/VICODIN) 5-325 MG tablet Take 1-2 tablets by mouth every 4 (four) hours as needed for moderate pain (pain score 4-6).   loratadine (CLARITIN) 10 MG tablet Take 1 tablet (10 mg total) by mouth daily.   Omega-3 350 MG CPDR Take 1 tablet by mouth daily at 6 (six) AM.   omeprazole (PRILOSEC) 40 MG capsule Take 1 capsule (40 mg total) by mouth daily.   REPATHA SURECLICK 140 MG/ML SOAJ INJECT 140MG  INTO THE SKIN EVERY 14 DAYS   sitaGLIPtin-metformin (JANUMET) 50-1000 MG per tablet Take 1 tablet by mouth 2 (two) times daily with a meal.    sodium bicarbonate 650 MG tablet Take by mouth.   traMADol (ULTRAM) 50 MG tablet Take 50 mg by mouth every 8 (eight) hours as needed for moderate pain or severe pain.   [DISCONTINUED] amLODipine (NORVASC) 5 MG tablet Take 1 tablet (5 mg total) by mouth daily. Please keep upcoming appt in July 2022 with Dr. August 2022 before anymore refills. Thank you   [DISCONTINUED] metoprolol tartrate (LOPRESSOR) 50 MG tablet TAKE ONE (1) AND ONE-HALF (1/2) TABLETS BY MOUTH TWICE DAILY     Allergies:   Statins and Nsaids   Social History   Tobacco Use   Smoking status: Former    Types: Cigarettes    Quit date: 02/26/2001    Years since quitting: 20.0   Smokeless tobacco: Never  Vaping Use   Vaping Use: Never used  Substance Use Topics   Alcohol use: Yes    Comment: OCCASIONAL   Drug use: No    Family Hx: The patient's family history is not on file.  Review of Systems  Cardiovascular:  Negative for claudication.    EKGs/Labs/Other Test Reviewed:    EKG:  EKG is  ordered today.  The ekg ordered today demonstrates NSR, HR 75, normal axis, nonspecific ST-T wave changes, poor wave progression, QTC 417  Recent Labs: No results found for  requested labs within last 8760 hours.   Recent Lipid Panel Lab Results  Component Value Date/Time   CHOL 126 10/30/2019 10:39 AM   TRIG 285 (H) 10/30/2019 10:39 AM   HDL 40 10/30/2019 10:39 AM   LDLCALC 43 10/30/2019 10:39 AM   LDLDIRECT 198 (H) 06/12/2018 04:43 AM     Risk Assessment/Calculations:          Physical Exam:    VS:  BP 102/62   Pulse 75   Ht 5\' 3"  (1.6 m)   Wt 141 lb 12.8 oz (64.3 kg)   SpO2 97%   BMI 25.12 kg/m  Wt Readings from Last 3 Encounters:  03/18/21 141 lb 12.8 oz (64.3 kg)  10/30/19 140 lb 6.4 oz (63.7 kg)  05/28/19 144 lb 9.6 oz (65.6 kg)    Constitutional:      Appearance: Healthy appearance. Not in distress.  Neck:     Vascular: JVD normal.  Pulmonary:     Effort: Pulmonary effort is normal.     Breath sounds: No wheezing. No rales.  Cardiovascular:     Normal rate. Regular rhythm. Normal S1. Normal S2.      Murmurs: There is no murmur.  Edema:    Peripheral edema absent.  Abdominal:     Palpations: Abdomen is soft.  Skin:    General: Skin is warm and dry.  Neurological:     Mental Status: Alert and oriented to person, place and time.     Cranial Nerves: Cranial nerves are intact.        Dispo:  Return in about 1 year (around 03/18/2022) for Routine Follow Up with Dr. Anne Fu.   Medication Adjustments/Labs and Tests Ordered: Current medicines are reviewed at length with the patient today.  Concerns regarding medicines are outlined above.  Tests Ordered: Orders Placed This Encounter  Procedures   EKG 12-Lead    Medication Changes: Meds ordered this encounter  Medications   metoprolol tartrate (LOPRESSOR) 50 MG tablet    Sig: TAKE ONE (1) AND ONE-HALF (1/2) TABLETS BY MOUTH TWICE DAILY    Dispense:  270 tablet    Refill:  2   amLODipine (NORVASC) 5 MG tablet    Sig: Take 1 tablet (5 mg total) by mouth daily.    Dispense:  90 tablet    Refill:  3   aspirin EC 81 MG tablet    Sig: Take 1 tablet (81 mg total) by  mouth daily. Swallow whole.    Dispense:  90 tablet    Refill:  3   Omega-3 350 MG CPDR    Sig: Take 1 tablet by mouth daily at 6 (six) AM.    Dispense:  90 capsule    Refill:  3    Signed, Tereso Newcomer, PA-C  03/18/2021 11:32 AM    [facility] [address] [place], [place], [place]  [address] Phone: [phone]; Fax: [phone]

## 2021-03-18 ENCOUNTER — Other Ambulatory Visit: Payer: Self-pay

## 2021-03-18 ENCOUNTER — Encounter: Payer: Self-pay | Admitting: Physician Assistant

## 2021-03-18 ENCOUNTER — Ambulatory Visit: Payer: Medicare Other | Admitting: Physician Assistant

## 2021-03-18 VITALS — BP 102/62 | HR 75 | Ht 63.0 in | Wt 141.8 lb

## 2021-03-18 DIAGNOSIS — I2510 Atherosclerotic heart disease of native coronary artery without angina pectoris: Secondary | ICD-10-CM | POA: Diagnosis not present

## 2021-03-18 DIAGNOSIS — I10 Essential (primary) hypertension: Secondary | ICD-10-CM

## 2021-03-18 DIAGNOSIS — N183 Chronic kidney disease, stage 3 unspecified: Secondary | ICD-10-CM

## 2021-03-18 DIAGNOSIS — E1122 Type 2 diabetes mellitus with diabetic chronic kidney disease: Secondary | ICD-10-CM

## 2021-03-18 DIAGNOSIS — E785 Hyperlipidemia, unspecified: Secondary | ICD-10-CM

## 2021-03-18 MED ORDER — ASPIRIN EC 81 MG PO TBEC: 81.0000 mg | DELAYED_RELEASE_TABLET | Freq: Every day | ORAL | 3 refills | Status: AC

## 2021-03-18 MED ORDER — AMLODIPINE BESYLATE 5 MG PO TABS: 5.0000 mg | ORAL_TABLET | Freq: Every day | ORAL | 3 refills | Status: DC

## 2021-03-18 MED ORDER — METOPROLOL TARTRATE 50 MG PO TABS: ORAL_TABLET | ORAL | 2 refills | Status: DC

## 2021-03-18 MED ORDER — OMEGA-3 350 MG PO CPDR: 1.0000 | DELAYED_RELEASE_CAPSULE | Freq: Every day | ORAL | 3 refills | Status: AC

## 2021-03-18 NOTE — Patient Instructions (Signed)
Medication Instructions:   Your physician recommends that you continue on your current medications as directed. Please refer to the Current Medication list given to you today.  *If you need a refill on your cardiac medications before your next appointment, please call your pharmacy*   Lab Work:  -NONE  If you have labs (blood work) drawn today and your tests are completely normal, you will receive your results only by: MyChart Message (if you have MyChart) OR A paper copy in the mail If you have any lab test that is abnormal or we need to change your treatment, we will call you to review the results.   Testing/Procedures:  -NONE   Follow-Up: At [facility], you and your health needs are our priority.  As part of our continuing mission to provide you with exceptional heart care, we have created designated Provider Care Teams.  These Care Teams include your primary Cardiologist (physician) and Advanced Practice Providers (APPs -  Physician Assistants and Nurse Practitioners) who all work together to provide you with the care you need, when you need it.  We recommend signing up for the patient portal called "MyChart".  Sign up information is provided on this After Visit Summary.  MyChart is used to connect with patients for Virtual Visits (Telemedicine).  Patients are able to view lab/test results, encounter notes, upcoming appointments, etc.  Non-urgent messages can be sent to your provider as well.   To learn more about what you can do with MyChart, go to [email or web address].    Your next appointment:   1 year(s)  The format for your next appointment:   In Person  Provider:   Mark Skains, MD     Other Instructions  Your physician wants you to follow-up in: 1 year with Dr. Skains.  You will receive a reminder letter in the mail two months in advance. If you don't receive a letter, please call our office to schedule the follow-up appointment.   

## 2021-03-18 NOTE — Assessment & Plan Note (Addendum)
Followed by Nephrology at [facility].  Last Creatinine stable at 1.42.

## 2021-03-18 NOTE — Assessment & Plan Note (Signed)
Blood pressure is well controlled on a combination of amlodipine 5 mg daily and metoprolol tartrate 75 mg twice daily.

## 2021-03-18 NOTE — Assessment & Plan Note (Addendum)
S/p CABG in 2020 c/b post op AFib.  She is currently doing well without anginal symptoms.  She was intolerant to statins.  She remains on evolocumab 140 mg every 14 days and ezetimibe 10 mg daily along with metoprolol tartrate 75 mg twice daily.  Continue aspirin 81 mg daily.  Follow-up 1 year

## 2021-03-18 NOTE — Assessment & Plan Note (Addendum)
Most recent Lipid Panel with primary care was personally reviewed and demonstrates optimal LDL (50).  Triglycerides are somewhat elevated (219).  Total cholesterol was 121, HDL 43.  Continue to work on diet to reduce Triglycerides.  Continue evolocumab 140 mg every 14 days and ezetimibe 10 mg daily.

## 2021-05-10 ENCOUNTER — Other Ambulatory Visit: Payer: Self-pay | Admitting: Cardiology

## 2021-07-08 ENCOUNTER — Other Ambulatory Visit: Payer: Self-pay | Admitting: Cardiology

## 2021-07-08 DIAGNOSIS — I2510 Atherosclerotic heart disease of native coronary artery without angina pectoris: Secondary | ICD-10-CM

## 2022-01-19 ENCOUNTER — Other Ambulatory Visit: Payer: Self-pay | Admitting: Cardiology

## 2022-01-19 DIAGNOSIS — E785 Hyperlipidemia, unspecified: Secondary | ICD-10-CM

## 2022-01-19 DIAGNOSIS — I2510 Atherosclerotic heart disease of native coronary artery without angina pectoris: Secondary | ICD-10-CM

## 2022-01-20 ENCOUNTER — Other Ambulatory Visit: Payer: Self-pay | Admitting: Pharmacist

## 2022-01-20 DIAGNOSIS — E785 Hyperlipidemia, unspecified: Secondary | ICD-10-CM

## 2022-01-20 DIAGNOSIS — I2510 Atherosclerotic heart disease of native coronary artery without angina pectoris: Secondary | ICD-10-CM

## 2022-01-20 MED ORDER — REPATHA SURECLICK 140 MG/ML ~~LOC~~ SOAJ: SUBCUTANEOUS | 3 refills | Status: DC

## 2022-02-15 ENCOUNTER — Other Ambulatory Visit: Payer: Self-pay | Admitting: *Deleted

## 2022-02-15 MED ORDER — METOPROLOL TARTRATE 50 MG PO TABS: ORAL_TABLET | ORAL | 0 refills | Status: DC

## 2022-03-17 ENCOUNTER — Other Ambulatory Visit: Payer: Self-pay | Admitting: *Deleted

## 2022-03-20 ENCOUNTER — Other Ambulatory Visit: Payer: Self-pay

## 2022-03-20 MED ORDER — METOPROLOL TARTRATE 50 MG PO TABS: ORAL_TABLET | ORAL | 0 refills | Status: DC

## 2022-04-05 ENCOUNTER — Ambulatory Visit (INDEPENDENT_AMBULATORY_CARE_PROVIDER_SITE_OTHER): Payer: Medicare Other

## 2022-04-05 ENCOUNTER — Ambulatory Visit: Payer: Medicare Other | Admitting: Podiatry

## 2022-04-05 ENCOUNTER — Encounter: Payer: Self-pay | Admitting: Podiatry

## 2022-04-05 DIAGNOSIS — M21962 Unspecified acquired deformity of left lower leg: Secondary | ICD-10-CM

## 2022-04-05 DIAGNOSIS — M21961 Unspecified acquired deformity of right lower leg: Secondary | ICD-10-CM

## 2022-04-05 DIAGNOSIS — M79672 Pain in left foot: Secondary | ICD-10-CM | POA: Diagnosis not present

## 2022-04-05 NOTE — Progress Notes (Signed)
Subjective:  Patient ID: Cassidy Green, female    DOB: [birth year],  MRN: [id number]  Chief Complaint  Patient presents with   Foot Pain    66 y.o. female presents with the above complaint.  Patient presents with bilateral lateral foot pain at the fifth metatarsal base.  She states it hurts with ambulation worse with pressure.  She states she does gotten worse over the last year.  She denies any trauma to the area.  She has noticed her foot structure changing to more cavovarus foot type.  She denies any other acute complaints pain is mild to moderate on both sides.  No history of ankle sprain   Review of Systems: Negative except as noted in the HPI. Denies N/V/F/Ch.  Past Medical History:  Diagnosis Date   Anemia    Anxiety    Arthritis    CAD (coronary artery disease)    S/P CABG 05/2018   Chronic kidney disease    Diabetes mellitus without complication (HCC)    type II   Dysrhythmia    post CABG   Female bladder prolapse    GERD (gastroesophageal reflux disease)    Headache    History of bronchitis    History of pneumonia    Hyperlipidemia    Hypertension    Joint inflammation    Pneumonia     Current Outpatient Medications:    amLODipine (NORVASC) 5 MG tablet, Take 1 tablet (5 mg total) by mouth daily., Disp: 90 tablet, Rfl: 3   amoxicillin (AMOXIL) 500 MG capsule, amoxicillin 500 mg capsule  TAKE 4 CAPSULES BY MOUTH 1 HOUR PRIOR TO DENTAL APPOINTMENT, Disp: , Rfl:    aspirin EC 81 MG tablet, Take 1 tablet (81 mg total) by mouth daily. Swallow whole., Disp: 90 tablet, Rfl: 3   buPROPion (WELLBUTRIN) 100 MG tablet, Take 100 mg by mouth daily as needed (anxiety). , Disp: , Rfl:    Evolocumab (REPATHA SURECLICK) 140 MG/ML SOAJ, INJECT 140 MG INTO THE SKIN EVERY 14 DAYS, Disp: 6 mL, Rfl: 3   ezetimibe (ZETIA) 10 MG tablet, Take 1 tablet by mouth at bedtime, Disp: 90 tablet, Rfl: 3   ferrous sulfate 325 (65 FE) MG EC tablet, Take 325 mg by mouth daily., Disp: , Rfl:     fluticasone (FLONASE) 50 MCG/ACT nasal spray, Place 1 spray into both nostrils daily., Disp: , Rfl:    folic acid (FOLVITE) 1 MG tablet, Take 1 mg by mouth every evening. , Disp: , Rfl:    HYDROcodone-acetaminophen (NORCO/VICODIN) 5-325 MG tablet, Take 1-2 tablets by mouth every 4 (four) hours as needed for moderate pain (pain score 4-6)., Disp: 30 tablet, Rfl: 0   loratadine (CLARITIN) 10 MG tablet, Take 1 tablet (10 mg total) by mouth daily., Disp: , Rfl:    metoprolol tartrate (LOPRESSOR) 50 MG tablet, TAKE ONE (1) AND ONE-HALF (1/2) TABLETS BY MOUTH TWICE DAILY, Disp: 90 tablet, Rfl: 0   Omega-3 350 MG CPDR, Take 1 tablet by mouth daily at 6 (six) AM., Disp: 90 capsule, Rfl: 3   omeprazole (PRILOSEC) 40 MG capsule, Take 1 capsule (40 mg total) by mouth daily., Disp: 30 capsule, Rfl: 11   sitaGLIPtin-metformin (JANUMET) 50-1000 MG per tablet, Take 1 tablet by mouth 2 (two) times daily with a meal. , Disp: , Rfl:    sodium bicarbonate 650 MG tablet, Take by mouth., Disp: , Rfl:    traMADol (ULTRAM) 50 MG tablet, Take 50 mg by mouth every 8 (eight)  hours as needed for moderate pain or severe pain., Disp: , Rfl:  No current facility-administered medications for this visit.  Facility-Administered Medications Ordered in Other Visits:    dexamethasone (DECADRON) injection 4 mg, 4 mg, Intravenous, Once, Swinteck, Arlys John, MD   ondansetron (ZOFRAN) 4 mg in sodium chloride 0.9 % 50 mL IVPB, 4 mg, Intravenous, Once, Swinteck, Arlys John, MD  Social History   Tobacco Use  Smoking Status Former   Types: Cigarettes   Quit date: 02/26/2001   Years since quitting: 21.1  Smokeless Tobacco Never    Allergies  Allergen Reactions   Statins Nausea And Vomiting and Other (See Comments)     " I cant recall the name of the statin, but I had joint pain with one of the statins "   Nsaids Other (See Comments)    CHRONIC KIDNEY DISEASE   Objective:  There were no vitals filed for this visit. There is no height or  weight on file to calculate BMI. Constitutional Well developed. Well nourished.  Vascular Dorsalis pedis pulses palpable bilaterally. Posterior tibial pulses palpable bilaterally. Capillary refill normal to all digits.  No cyanosis or clubbing noted. Pedal hair growth normal.  Neurologic Normal speech. Oriented to person, place, and time. Epicritic sensation to light touch grossly present bilaterally.  Dermatologic Nails well groomed and normal in appearance. No open wounds. No skin lesions.  Orthopedic: Gait examination shows cavovarus foot structure that is flexible in nature with left foot foot type upon weightbearing.  Pain on palpation to fifth metatarsal base bilaterally.  No hyperkeratotic lesion noted.  No pain along the course of the peroneal tendon noted.  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  Some arthritis noted at the subtalar joint. Assessment:   1. Foot deformity, right   2. Acquired foot deformity, left    Plan:  Patient was evaluated and treated and all questions answered.  Bilateral fifth metatarsal base bruise mild with underlying cavovarus foot type -All questions and concerns were discussed with the patient in extensive detail.  Given the foot structure that she presents with especially nonweightbearing status I believe patient will benefit from Tri-Lock ankle brace to give her stability and to hold the ankle joint in more neutral position.  I discussed with patient she states understanding if there is no improvement we will discuss steroid injection during next clinical visit.  She states understanding. -Bilateral Tri-Lock ankle brace were dispensed -I discussed shoe gear modification and power steps orthotics as well  No follow-ups on file.    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  Bilateral trial ankle brace  I discussed shoe gear modification and power steps

## 2022-04-14 ENCOUNTER — Encounter: Payer: Self-pay | Admitting: Cardiology

## 2022-04-14 ENCOUNTER — Ambulatory Visit: Payer: Medicare Other | Attending: Cardiology | Admitting: Cardiology

## 2022-04-14 VITALS — BP 138/80 | HR 82 | Ht 62.0 in | Wt 143.2 lb

## 2022-04-14 DIAGNOSIS — E1122 Type 2 diabetes mellitus with diabetic chronic kidney disease: Secondary | ICD-10-CM | POA: Diagnosis not present

## 2022-04-14 DIAGNOSIS — I2510 Atherosclerotic heart disease of native coronary artery without angina pectoris: Secondary | ICD-10-CM

## 2022-04-14 DIAGNOSIS — I10 Essential (primary) hypertension: Secondary | ICD-10-CM | POA: Diagnosis not present

## 2022-04-14 DIAGNOSIS — N183 Chronic kidney disease, stage 3 unspecified: Secondary | ICD-10-CM

## 2022-04-14 MED ORDER — AMLODIPINE BESYLATE 5 MG PO TABS: 5.0000 mg | ORAL_TABLET | Freq: Every day | ORAL | 3 refills | Status: DC

## 2022-04-14 MED ORDER — METOPROLOL TARTRATE 50 MG PO TABS: ORAL_TABLET | ORAL | 0 refills | Status: DC

## 2022-04-14 NOTE — Patient Instructions (Signed)
Medication Instructions:  Your physician recommends that you continue on your current medications as directed. Please refer to the Current Medication list given to you today.  *If you need a refill on your cardiac medications before your next appointment, please call your pharmacy*  Follow-Up: At  HeartCare, you and your health needs are our priority.  As part of our continuing mission to provide you with exceptional heart care, we have created designated Provider Care Teams.  These Care Teams include your primary Cardiologist (physician) and Advanced Practice Providers (APPs -  Physician Assistants and Nurse Practitioners) who all work together to provide you with the care you need, when you need it.  We recommend signing up for the patient portal called "MyChart".  Sign up information is provided on this After Visit Summary.  MyChart is used to connect with patients for Virtual Visits (Telemedicine).  Patients are able to view lab/test results, encounter notes, upcoming appointments, etc.  Non-urgent messages can be sent to your provider as well.   To learn more about what you can do with MyChart, go to [email or web address].    Your next appointment:   1 year(s)  The format for your next appointment:   In Person  Provider:   Mark Skains, MD     Important Information About Sugar       

## 2022-04-14 NOTE — Progress Notes (Unsigned)
Cardiology Office Note:    Date:  04/15/2022   ID:  Cassidy Green, DOB 02/07/1956, MRN [id number]  PCP:  Hadley Pen, MD   [facility] Providers Cardiologist:  Donato Schultz, MD     Referring MD: Hadley Pen, MD     History of Present Illness:    Cassidy Green is a 66 y.o. female with a hx of HDL, CKD, CAD, and HTN who presents today for a follow-up post CABG 2020.  She was seen on 03/18/2021 with Tereso Newcomer, PA-C for a follow-up on CAD. She reported doing well without anginal symptoms and was intolerant to statins. She remained on Evolocumab 140mg  every 14 days and Ezetimibe 10mg  daily along with Metoprolol Tartrate 75 mg twice daily upon last visit. She was advised to continue Aspirin 81 mg daily and continue medical regimens.   Today, she has been doing well.   Her last LDL was 43, which has been evaluated with , MD from [facility].  She is still on her Repatha and has been doing well on it as well as Metoprolol.  She denies any palpitations, chest pain, shortness of breath, or peripheral edema. No lightheadedness, headaches, syncope, orthopnea, or PND.   Past Medical History:  Diagnosis Date   Anemia    Anxiety    Arthritis    CAD (coronary artery disease)    S/P CABG 05/2018   Chronic kidney disease    Diabetes mellitus without complication (HCC)    type II   Dysrhythmia    post CABG   Female bladder prolapse    GERD (gastroesophageal reflux disease)    Headache    History of bronchitis    History of pneumonia    Hyperlipidemia    Hypertension    Joint inflammation    Pneumonia     Past Surgical History:  Procedure Laterality Date   ABDOMINAL HYSTERECTOMY  1992   COLONOSCOPY     CORONARY ARTERY BYPASS GRAFT N/A 06/14/2018   Procedure: CORONARY ARTERY BYPASS GRAFTING (CABG) x4  using left internal mammary artery and right greater saphenous vein harvested endoscopically.;  Surgeon: 06/2018, MD;  Location:  [facility] OR;  Service: Open Heart Surgery;  Laterality: N/A;   INCISION AND DRAINAGE HIP Left 09/04/2014   Procedure: IRRIGATION AND DEBRIDEMENT  LEFT HIP WOUND WITH CLOSURE ;  Surgeon: [name], MD;  Location: [facility] OR;  Service: Orthopedics;  Laterality: Left;   kidney stones     KNEE ARTHROPLASTY Right 05/28/2019   Procedure: COMPUTER ASSISTED TOTAL KNEE ARTHROPLASTY;  Surgeon: Samson Frederic, MD;  Location: [facility] ORS;  Service: Orthopedics;  Laterality: Right;   LEFT HEART CATH AND CORONARY ANGIOGRAPHY N/A 06/12/2018   Procedure: LEFT HEART CATH AND CORONARY ANGIOGRAPHY;  Surgeon: Samson Frederic, MD;  Location: [facility] INVASIVE CV LAB;  Service: Cardiovascular;  Laterality: N/A;   LUMBAR LAMINECTOMY/DECOMPRESSION MICRODISCECTOMY  03/11/2015   Procedure: Lumbar Four-Five DECOMPRESSION  AND REMOVAL SYNOVAL CYST;  Surgeon: Lennette Bihari, MD;  Location: [facility] OR;  Service: Orthopedics;;   TEE WITHOUT CARDIOVERSION N/A 06/14/2018   Procedure: TRANSESOPHAGEAL ECHOCARDIOGRAM (TEE);  Surgeon: Venita Lick, MD;  Location: [facility] OR;  Service: Open Heart Surgery;  Laterality: N/A;   TOTAL HIP ARTHROPLASTY Left 07/30/2014   Procedure: LEFT TOTAL HIP ARTHROPLASTY ANTERIOR APPROACH;  Surgeon: [facility], MD;  Location: [facility] ORS;  Service: Orthopedics;  Laterality: Left;   TUBAL LIGATION     ULTRASOUND GUIDANCE FOR VASCULAR ACCESS  06/12/2018   Procedure: Ultrasound Guidance For Vascular Access;  Surgeon: Lennette Bihari, MD;  Location: [facility] INVASIVE CV LAB;  Service: Cardiovascular;;    Current Medications: Current Meds  Medication Sig   amoxicillin (AMOXIL) 500 MG capsule amoxicillin 500 mg capsule  TAKE 4 CAPSULES BY MOUTH 1 HOUR PRIOR TO DENTAL APPOINTMENT   aspirin EC 81 MG tablet Take 1 tablet (81 mg total) by mouth daily. Swallow whole.   buPROPion (WELLBUTRIN) 100 MG tablet Take 100 mg by mouth daily as needed (anxiety).    Evolocumab (REPATHA SURECLICK) 140 MG/ML SOAJ INJECT 140 MG INTO THE SKIN  EVERY 14 DAYS   ezetimibe (ZETIA) 10 MG tablet Take 1 tablet by mouth at bedtime   ferrous sulfate 325 (65 FE) MG EC tablet Take 325 mg by mouth daily.   fluticasone (FLONASE) 50 MCG/ACT nasal spray Place 1 spray into both nostrils daily.   loratadine (CLARITIN) 10 MG tablet Take 1 tablet (10 mg total) by mouth daily.   Omega-3 350 MG CPDR Take 1 tablet by mouth daily at 6 (six) AM.   omeprazole (PRILOSEC) 40 MG capsule Take 1 capsule (40 mg total) by mouth daily.   sitaGLIPtin-metformin (JANUMET) 50-1000 MG per tablet Take 1 tablet by mouth 2 (two) times daily with a meal.    sodium bicarbonate 650 MG tablet Take by mouth.   [DISCONTINUED] amLODipine (NORVASC) 5 MG tablet Take 1 tablet (5 mg total) by mouth daily.   [DISCONTINUED] metoprolol tartrate (LOPRESSOR) 50 MG tablet TAKE ONE (1) AND ONE-HALF (1/2) TABLETS BY MOUTH TWICE DAILY     Allergies:   Statins and Nsaids   Social History   Socioeconomic History   Marital status: Married    Spouse name: Not on file   Number of children: Not on file   Years of education: Not on file   Highest education level: Not on file  Occupational History   Not on file  Tobacco Use   Smoking status: Former    Types: Cigarettes    Quit date: 02/26/2001    Years since quitting: 21.1   Smokeless tobacco: Never  Vaping Use   Vaping Use: Never used  Substance and Sexual Activity   Alcohol use: Yes    Comment: OCCASIONAL   Drug use: No   Sexual activity: Not on file  Other Topics Concern   Not on file  Social History Narrative   Not on file   Social Determinants of Health   Financial Resource Strain: Not on file  Food Insecurity: Not on file  Transportation Needs: Not on file  Physical Activity: Not on file  Stress: Not on file  Social Connections: Not on file     Family History: The patient's family history is not on file.  ROS:   Please see the history of present illness.     All other systems reviewed and are  negative.  EKGs/Labs/Other Studies Reviewed:    The following studies were reviewed today:  CT Chest 07/23/2018:  FINDINGS: Mediastinum and hilar structures normal. Mild left base subsegmental atelectasis. Prior CABG. Heart size normal. No acute bony abnormality.   IMPRESSION: 1.  Mild left base subsegmental atelectasis.  Echo 06/14/2018:   Septum: No Patent Foramen Ovale present.   Left atrium: Patent foramen ovale not present.   Aortic valve: No AV vegetation.   Mitral valve: No leaflet thickening and calcification present. Trace  regurgitation.   Right ventricle: Normal cavity size, wall thickness and ejection  fraction.  No thrombus present. No mass present.   Pulmonic valve: Trace regurgitation.     VAS US Doppler  Summary:  Right Carotid: The extracranial vessels were near-normal with only minimal  wall                thickening or plaque.   Left Carotid: The extracranial vessels were near-normal with only minimal  wall               thickening or plaque.  Vertebrals:  Bilateral vertebral arteries demonstrate antegrade flow.  Subclavians: Normal flow hemodynamics were seen in bilateral subclavian               arteries.   Right ABI: Resting right ankle-brachial index is within normal range. No  evidence of significant right lower extremity arterial disease.  Left ABI: Resting left ankle-brachial index is within normal range. No  evidence of significant left lower extremity arterial disease.  Right Upper Extremity: Doppler waveforms remain within normal limits with  right radial compression. Doppler waveform obliterate with right ulnar  compression.  Left Upper Extremity: Doppler waveforms remain within normal limits with  left radial compression. Doppler waveforms reverse with left ulnar  compression.   Left Heart Cath and Coronary Angiography 06/12/2018: Severe multivessel CAD with 90% near ostial LAD stenosis followed by 80% stenosis after the first  diagonal vessel which had 40% stenoses in the diagonal vessel.  The mid LAD is subtotal/totally occluded and there is collateralization from septal perforators supplying the apical portion of the LAD which wraps around the apex.  There is also collateralization to the distal RCA via the left coronary injection ;  70% proximal eccentric circumflex stenosis; right coronary artery with diffuse mid 50% narrowing followed by diffuse 80% stenosis beyond the acute margin and 99% PDA stenosis with evidence for both antegrade and retrograde filling of the distal PDA.   Mild LV dysfunction with EF at 50% with focal mild basal inferior hypocontractility.  LVEDP 17 mm.   EKG:  EKG is personally reviewed. 04/14/2022: NSR. Rate 81 bpm. 03/18/2022: NSR, Rate 75 bpm.    Recent Labs: No results found for requested labs within last 365 days.  Recent Lipid Panel    Component Value Date/Time   CHOL 126 10/30/2019 1039   TRIG 285 (H) 10/30/2019 1039   HDL 40 10/30/2019 1039   CHOLHDL 3.2 10/30/2019 1039   CHOLHDL 13.0 06/12/2018 0443   VLDL UNABLE TO CALCULATE IF TRIGLYCERIDE OVER 400 mg/dL 06/12/2018 0443   LDLCALC 43 10/30/2019 1039   LDLDIRECT 198 (H) 06/12/2018 0443     Risk Assessment/Calculations:               Physical Exam:    VS:  BP 138/80 (BP Location: Left Arm, Patient Position: Sitting, Cuff Size: Normal)   Pulse 82   Ht 5\' 2"  (1.575 m)   Wt 143 lb 4 oz (65 kg)   SpO2 98%   BMI 26.20 kg/m     Wt Readings from Last 3 Encounters:  04/14/22 143 lb 4 oz (65 kg)  03/18/21 141 lb 12.8 oz (64.3 kg)  10/30/19 140 lb 6.4 oz (63.7 kg)     GEN:  Well nourished, well developed in no acute distress HEENT: Normal NECK: No JVD; No carotid bruits LYMPHATICS: No lymphadenopathy CARDIAC: RRR, no murmurs, rubs, gallops RESPIRATORY:  Clear to auscultation without rales, wheezing or rhonchi  ABDOMEN: Soft, non-tender, non-distended MUSCULOSKELETAL:  No edema; No deformity  SKIN: Warm and  dry NEUROLOGIC:  Alert and oriented x 3 PSYCHIATRIC:  Normal affect   ASSESSMENT:    1. Essential hypertension   2. Coronary artery disease involving native coronary artery of native heart without angina pectoris   3. CKD stage 3 due to type 2 diabetes mellitus (HCC)    PLAN:    In order of problems listed above:  Coronary artery disease status post CABG 2020 - CABG x4 on aspirin beta-blocker Repatha Zetia.  Doing well.  Continue with good diabetic control.  Good blood pressure control.  Exercises.  No changes made.  Diabetes with hypertension and hyperlipidemia - Tolerating the Repatha.  LDL goal less than 70, should be optimally less than 55.  Previously was 14.  Her primary physician is checking this.  No changes made. -At prior visit we added amlodipine 5 mg once a day to help her with her blood pressure.  Seems to be working fairly well.  Chronic kidney disease stage IIIa - Creatinine has ranged from 1.3-1.7.  She sees nephrology at [facility].  Continue to monitor.  Avoid NSAIDs.          Follow-up: 1-year  Medication Adjustments/Labs and Tests Ordered: Current medicines are reviewed at length with the patient today.  Concerns regarding medicines are outlined above.  Orders Placed This Encounter  Procedures   EKG 12-Lead   Meds ordered this encounter  Medications   amLODipine (NORVASC) 5 MG tablet    Sig: Take 1 tablet (5 mg total) by mouth daily.    Dispense:  90 tablet    Refill:  3   metoprolol tartrate (LOPRESSOR) 50 MG tablet    Sig: TAKE ONE (1) AND ONE-HALF (1/2) TABLETS BY MOUTH TWICE DAILY    Dispense:  90 tablet    Refill:  0    Pt must keep upcoming appt in October 2023 with Cardiologist before anymore refills. Thank you Final Attempt    Patient Instructions  Medication Instructions:  Your physician recommends that you continue on your current medications as directed. Please refer to the Current Medication list given to you today.  *If you need a  refill on your cardiac medications before your next appointment, please call your pharmacy*   Follow-Up: At [facility], you and your health needs are our priority.  As part of our continuing mission to provide you with exceptional heart care, we have created designated Provider Care Teams.  These Care Teams include your primary Cardiologist (physician) and Advanced Practice Providers (APPs -  Physician Assistants and Nurse Practitioners) who all work together to provide you with the care you need, when you need it.  We recommend signing up for the patient portal called "MyChart".  Sign up information is provided on this After Visit Summary.  MyChart is used to connect with patients for Virtual Visits (Telemedicine).  Patients are able to view lab/test results, encounter notes, upcoming appointments, etc.  Non-urgent messages can be sent to your provider as well.   To learn more about what you can do with MyChart, go to [email or web address].    Your next appointment:   1 year(s)  The format for your next appointment:   In Person  Provider:   Candee Furbish, MD      Important Information About Sugar          Ericka Pontiff as a scribe for Candee Furbish, MD.,have documented all relevant documentation on the behalf of Candee Furbish, MD,as directed by  Candee Furbish, MD while in the  presence of Candee Furbish, MD.  I, Candee Furbish, MD, have reviewed all documentation for this visit. The documentation on 04/15/22 for the exam, diagnosis, procedures, and orders are all accurate and complete.   Signed, Candee Furbish, MD  04/15/2022 12:01 PM    [place]

## 2022-05-01 ENCOUNTER — Telehealth: Payer: Self-pay | Admitting: Cardiology

## 2022-05-01 NOTE — Telephone Encounter (Signed)
Patient enrolled in the [facility]  CARD NO. [id number]   CARD STATUS Active   BIN [id number]   PCN [id number]   PC GROUP [id number] Patient informed. Information provided to the pharmacy and advised to keep in the file until exp (04/01/2023).

## 2022-05-01 NOTE — Telephone Encounter (Signed)
Pt c/o medication issue:  1. Name of Medication:   Evolocumab (REPATHA SURECLICK) 140 MG/ML SOAJ    2. How are you currently taking this medication (dosage and times per day)?   3. Are you having a reaction (difficulty breathing--STAT)?   4. What is your medication issue? Pt is requesting call back to discuss this medication and pt assistance for payment. She says that the price has gone up quite a bit and would like a call back to discuss her options.

## 2022-05-24 ENCOUNTER — Other Ambulatory Visit: Payer: Self-pay | Admitting: Cardiology

## 2022-06-05 ENCOUNTER — Other Ambulatory Visit (HOSPITAL_COMMUNITY): Payer: Self-pay

## 2023-04-15 NOTE — Progress Notes (Deleted)
Cardiology Office Note:  .   Date:  04/15/2023  ID:  FERRYN KIESOW, DOB 08-03-1955, MRN [id number] PCP: Hadley Pen, MD  [facility] Providers Cardiologist:  Donato Schultz, MD {  History of Present Illness: .   Cassidy Green is a 67 y.o. female with a past medical history of HLD, CKD, CAD, and HTN who presents for follow-up post CABG in 2020.  She was seen on 03/18/2021 by Tereso Newcomer, PA-C for follow-up on CAD.  She reported doing well without any anginal symptoms and was intolerant to statins.  Remained on Evo q. Mab 140 mg every 14 days and ezetimibe 10 mg daily along with metoprolol tartrate 75 mg twice a day upon her last visit.  She was advised to continue aspirin 81 mg daily and continue medical regimen.  She was last seen 04/14/2022 and was doing well at that time.  Last LDL was 43 and she had been evaluated with Keturah Barre, MD from [facility].  Still on her Repatha and was doing well on it as well as metoprolol.  Denied any palpitations, chest pain, shortness of breath, peripheral edema, lightheadedness, headaches, syncope, orthopnea, or PND.  Today, she***  ROS: Pertinent ROS in HPI  Studies Reviewed: .        CT Chest 07/23/2018:  FINDINGS: Mediastinum and hilar structures normal. Mild left base subsegmental atelectasis. Prior CABG. Heart size normal. No acute bony abnormality.   IMPRESSION: 1.  Mild left base subsegmental atelectasis.   Echo 06/14/2018:   Septum: No Patent Foramen Ovale present.   Left atrium: Patent foramen ovale not present.   Aortic valve: No AV vegetation.   Mitral valve: No leaflet thickening and calcification present. Trace  regurgitation.   Right ventricle: Normal cavity size, wall thickness and ejection  fraction. No thrombus present. No mass present.   Pulmonic valve: Trace regurgitation.     VAS US Doppler  Summary:  Right Carotid: The extracranial vessels were near-normal with only minimal  wall                 thickening or plaque.   Left Carotid: The extracranial vessels were near-normal with only minimal  wall               thickening or plaque.  Vertebrals:  Bilateral vertebral arteries demonstrate antegrade flow.  Subclavians: Normal flow hemodynamics were seen in bilateral subclavian               arteries.   Right ABI: Resting right ankle-brachial index is within normal range. No  evidence of significant right lower extremity arterial disease.  Left ABI: Resting left ankle-brachial index is within normal range. No  evidence of significant left lower extremity arterial disease.  Right Upper Extremity: Doppler waveforms remain within normal limits with  right radial compression. Doppler waveform obliterate with right ulnar  compression.  Left Upper Extremity: Doppler waveforms remain within normal limits with  left radial compression. Doppler waveforms reverse with left ulnar  compression.    Left Heart Cath and Coronary Angiography 06/12/2018: Severe multivessel CAD with 90% near ostial LAD stenosis followed by 80% stenosis after the first diagonal vessel which had 40% stenoses in the diagonal vessel.  The mid LAD is subtotal/totally occluded and there is collateralization from septal perforators supplying the apical portion of the LAD which wraps around the apex.  There is also collateralization to the distal RCA via the left coronary injection ;  70% proximal eccentric  circumflex stenosis; right coronary artery with diffuse mid 50% narrowing followed by diffuse 80% stenosis beyond the acute margin and 99% PDA stenosis with evidence for both antegrade and retrograde filling of the distal PDA.   Mild LV dysfunction with EF at 50% with focal mild basal inferior hypocontractility.  LVEDP 17 mm.   Risk Assessment/Calculations:   {Does this patient have ATRIAL FIBRILLATION?:[id number]} No BP recorded.  {Refresh Note OR Click here to enter BP  :1}***       Physical Exam:   VS:  There were  no vitals taken for this visit.   Wt Readings from Last 3 Encounters:  04/14/22 143 lb 4 oz (65 kg)  03/18/21 141 lb 12.8 oz (64.3 kg)  10/30/19 140 lb 6.4 oz (63.7 kg)    GEN: Well nourished, well developed in no acute distress NECK: No JVD; No carotid bruits CARDIAC: ***RRR, no murmurs, rubs, gallops RESPIRATORY:  Clear to auscultation without rales, wheezing or rhonchi  ABDOMEN: Soft, non-tender, non-distended EXTREMITIES:  No edema; No deformity   ASSESSMENT AND PLAN: .   Hypertension CAD Diabetes with hypertension and hyperlipidemia Chronic kidney disease stage III AA    {Are you ordering a CV Procedure (e.g. stress test, cath, DCCV, TEE, etc)?   Press F2        :[id number]}  Dispo: ***  Signed, Sharlene Dory, PA-C

## 2023-04-16 ENCOUNTER — Ambulatory Visit: Payer: Medicare Other | Attending: Physician Assistant | Admitting: Physician Assistant

## 2023-04-16 DIAGNOSIS — I10 Essential (primary) hypertension: Secondary | ICD-10-CM

## 2023-04-16 DIAGNOSIS — E785 Hyperlipidemia, unspecified: Secondary | ICD-10-CM

## 2023-04-16 DIAGNOSIS — I2510 Atherosclerotic heart disease of native coronary artery without angina pectoris: Secondary | ICD-10-CM

## 2023-04-16 DIAGNOSIS — N183 Chronic kidney disease, stage 3 unspecified: Secondary | ICD-10-CM

## 2023-04-17 ENCOUNTER — Other Ambulatory Visit: Payer: Self-pay | Admitting: Cardiology

## 2023-04-17 ENCOUNTER — Telehealth: Payer: Self-pay | Admitting: Cardiology

## 2023-04-17 ENCOUNTER — Encounter: Payer: Self-pay | Admitting: Physician Assistant

## 2023-04-17 DIAGNOSIS — I2510 Atherosclerotic heart disease of native coronary artery without angina pectoris: Secondary | ICD-10-CM

## 2023-04-17 DIAGNOSIS — E785 Hyperlipidemia, unspecified: Secondary | ICD-10-CM

## 2023-04-17 MED ORDER — AMLODIPINE BESYLATE 5 MG PO TABS: 5.0000 mg | ORAL_TABLET | Freq: Every day | ORAL | 3 refills | Status: DC

## 2023-04-17 MED ORDER — METOPROLOL TARTRATE 50 MG PO TABS: ORAL_TABLET | ORAL | 3 refills | Status: DC

## 2023-04-17 NOTE — Telephone Encounter (Signed)
Pt missed appt with pa, rescheduled appt. But wants to make sure all prescriptions will be available for her to refill. Repatha, metoprolol and amlodipine are her main concerns. Please feel free to call pt and advise.

## 2023-05-31 ENCOUNTER — Ambulatory Visit: Payer: Medicare Other | Attending: Physician Assistant | Admitting: Physician Assistant

## 2023-05-31 VITALS — BP 144/64 | HR 71 | Ht 61.0 in | Wt 140.0 lb

## 2023-05-31 DIAGNOSIS — I10 Essential (primary) hypertension: Secondary | ICD-10-CM

## 2023-05-31 DIAGNOSIS — E1122 Type 2 diabetes mellitus with diabetic chronic kidney disease: Secondary | ICD-10-CM | POA: Diagnosis not present

## 2023-05-31 DIAGNOSIS — E785 Hyperlipidemia, unspecified: Secondary | ICD-10-CM | POA: Diagnosis not present

## 2023-05-31 DIAGNOSIS — I2510 Atherosclerotic heart disease of native coronary artery without angina pectoris: Secondary | ICD-10-CM

## 2023-05-31 DIAGNOSIS — N183 Chronic kidney disease, stage 3 unspecified: Secondary | ICD-10-CM

## 2023-05-31 NOTE — Patient Instructions (Signed)
 Medication Instructions:  Your physician recommends that you continue on your current medications as directed. Please refer to the Current Medication list given to you today.  *If you need a refill on your cardiac medications before your next appointment, please call your pharmacy*   Lab Work: None ordered  If you have labs (blood work) drawn today and your tests are completely normal, you will receive your results only by: MyChart Message (if you have MyChart) OR A paper copy in the mail If you have any lab test that is abnormal or we need to change your treatment, we will call you to review the results.   Testing/Procedures: None ordered   Follow-Up: At [facility], you and your health needs are our priority.  As part of our continuing mission to provide you with exceptional heart care, we have created designated Provider Care Teams.  These Care Teams include your primary Cardiologist (physician) and Advanced Practice Providers (APPs -  Physician Assistants and Nurse Practitioners) who all work together to provide you with the care you need, when you need it.  We recommend signing up for the patient portal called "MyChart".  Sign up information is provided on this After Visit Summary.  MyChart is used to connect with patients for Virtual Visits (Telemedicine).  Patients are able to view lab/test results, encounter notes, upcoming appointments, etc.  Non-urgent messages can be sent to your provider as well.   To learn more about what you can do with MyChart, go to [email or web address].    Your next appointment:   12 month(s)  Provider:   Donato Schultz, MD     Other Instructions

## 2023-05-31 NOTE — Progress Notes (Signed)
 Cardiology Office Note:  .   Date:  05/31/2023  ID:  Cassidy Green, DOB [birth year], MRN [id number] PCP: Cassidy Lamar LABOR, MD  [facility] Providers Cardiologist:  Oneil Parchment, MD {  History of Present Illness: .   Cassidy Green is a 68 y.o. female with a history of HLD, CKD, CAD and HTN who presents for annual follow-up.  History includes CABG in 2020.  Seen last year by Dr. Parchment for follow-up.  Remained on Repatha  for cholesterol control as well as Zetia  10 mg.  Metoprolol  tartrate 75 mg twice a day.  Advised to take an aspirin  a day as well.  At that visit she was doing well last LDL was 43.  Denied any chest pain or shortness of breath.  No lower extremity edema.  No lightheadedness, headaches, syncope, orthopnea, or PND.  Tolerating medications without any side effects.  Today, she presents with a history of high cholesterol, kidney issues, and diabetes, for a routine follow-up. She reports no issues with shortness of breath or chest pain over the past year. She has been compliant with her medications, including Repatha , Zetia , and metoprolol . The patient's triglycerides were slightly elevated at her last check, despite taking over-the-counter fish oil. She has been managing her diabetes well with glipizide and metformin  after switching from Janumet  due to cost issues. Her kidney function has improved, as evidenced by a reduction in her sodium bicarbonate  dosage. The patient has been experiencing a persistent sinus problem for the past month, causing discomfort and pain in her teeth and gums. Despite taking Claritin , and Flonase , and using a humidifier, the sinus issue persists.  Reports no shortness of breath nor dyspnea on exertion. Reports no chest pain, pressure, or tightness. No edema, orthopnea, PND. Reports no palpitations.   Discussed the use of AI scribe software for clinical note transcription with the patient, who gave verbal consent to proceed.  ROS: Pertinent ROS in  HPI  Studies Reviewed: .        CT Chest 07/23/2018:  FINDINGS: Mediastinum and hilar structures normal. Mild left base subsegmental atelectasis. Prior CABG. Heart size normal. No acute bony abnormality.   IMPRESSION: 1.  Mild left base subsegmental atelectasis.   Echo 06/14/2018:   Septum: No Patent Foramen Ovale present.   Left atrium: Patent foramen ovale not present.   Aortic valve: No AV vegetation.   Mitral valve: No leaflet thickening and calcification present. Trace  regurgitation.   Right ventricle: Normal cavity size, wall thickness and ejection  fraction. No thrombus present. No mass present.   Pulmonic valve: Trace regurgitation.     VAS US  Doppler  Summary:  Right Carotid: The extracranial vessels were near-normal with only minimal  wall                thickening or plaque.   Left Carotid: The extracranial vessels were near-normal with only minimal  wall               thickening or plaque.  Vertebrals:  Bilateral vertebral arteries demonstrate antegrade flow.  Subclavians: Normal flow hemodynamics were seen in bilateral subclavian               arteries.   Right ABI: Resting right ankle-brachial index is within normal range. No  evidence of significant right lower extremity arterial disease.  Left ABI: Resting left ankle-brachial index is within normal range. No  evidence of significant left lower extremity arterial disease.  Right Upper Extremity: Doppler waveforms  remain within normal limits with  right radial compression. Doppler waveform obliterate with right ulnar  compression.  Left Upper Extremity: Doppler waveforms remain within normal limits with  left radial compression. Doppler waveforms reverse with left ulnar  compression.    Left Heart Cath and Coronary Angiography 06/12/2018: Severe multivessel CAD with 90% near ostial LAD stenosis followed by 80% stenosis after the first diagonal vessel which had 40% stenoses in the diagonal vessel.   The mid LAD is subtotal/totally occluded and there is collateralization from septal perforators supplying the apical portion of the LAD which wraps around the apex.  There is also collateralization to the distal RCA via the left coronary injection ;  70% proximal eccentric circumflex stenosis; right coronary artery with diffuse mid 50% narrowing followed by diffuse 80% stenosis beyond the acute margin and 99% PDA stenosis with evidence for both antegrade and retrograde filling of the distal PDA.   Mild LV dysfunction with EF at 50% with focal mild basal inferior hypocontractility.  LVEDP 17 mm.       Physical Exam:   VS:  BP (!) 144/64   Pulse 71   Ht 5' 1 (1.549 m)   Wt 140 lb (63.5 kg)   SpO2 97%   BMI 26.45 kg/m    Wt Readings from Last 3 Encounters:  05/31/23 140 lb (63.5 kg)  04/14/22 143 lb 4 oz (65 kg)  03/18/21 141 lb 12.8 oz (64.3 kg)    GEN: Well nourished, well developed in no acute distress NECK: No JVD; No carotid bruits CARDIAC: RRR, no murmurs, rubs, gallops RESPIRATORY:  Clear to auscultation without rales, wheezing or rhonchi  ABDOMEN: Soft, non-tender, non-distended EXTREMITIES:  No edema; No deformity   ASSESSMENT AND PLAN: .    Hyperlipidemia Stable on Repatha  and Zetia . Recent labs showed elevated triglycerides (240) despite taking over-the-counter fish oil (Omega-3 fatty acids) 1000mg  daily since June. -Continue current medications. -Consider increasing fish oil dose or frequency. -Recheck triglycerides at next primary care visit.  Hypertension Stable on Metoprolol  75mg  twice daily. No recent symptoms of shortness of breath or chest pain. -Continue current medication.  Chronic Sinusitis Persistent sinus congestion and pain despite use of  Claritin , Flonase , and saline flush. Symptoms have been present since Thanksgiving and have been causing dental pain. -Consider increasing dose of Mucinex to 1200mg  twice daily with a large glass of water . -Consider  referral to an Ear, Nose, and Throat (ENT) specialist if symptoms persist.  Chronic Kidney Disease Stable with recent good report from nephrologist. Currently taking Sodium Bicarbonate . -Continue current medication.  Diabetes Mellitus Well-controlled on Glipizide and Metformin . -Continue current medications.  General Health Maintenance / Followup Plans -Continue current medications. -Recheck triglycerides at next primary care visit. -Consider increasing dose of Mucinex to 1200mg  twice daily with a large glass of water . -Consider referral to an Ear, Nose, and Throat (ENT) specialist if symptoms persist.      Dispo: She can follow-up in a year  Signed, Orren LOISE Fabry, PA-C

## 2023-08-03 ENCOUNTER — Other Ambulatory Visit: Payer: Self-pay | Admitting: Cardiology

## 2023-08-03 DIAGNOSIS — I2510 Atherosclerotic heart disease of native coronary artery without angina pectoris: Secondary | ICD-10-CM

## 2023-08-03 DIAGNOSIS — E785 Hyperlipidemia, unspecified: Secondary | ICD-10-CM

## 2023-10-04 ENCOUNTER — Encounter: Payer: Self-pay | Admitting: Neurology

## 2023-11-02 ENCOUNTER — Other Ambulatory Visit: Payer: Self-pay | Admitting: Cardiology

## 2023-11-02 DIAGNOSIS — I2510 Atherosclerotic heart disease of native coronary artery without angina pectoris: Secondary | ICD-10-CM

## 2023-11-02 DIAGNOSIS — E785 Hyperlipidemia, unspecified: Secondary | ICD-10-CM

## 2023-12-03 ENCOUNTER — Ambulatory Visit: Admitting: Neurology

## 2023-12-03 VITALS — BP 138/73 | HR 120 | Ht 62.0 in | Wt 132.0 lb

## 2023-12-03 DIAGNOSIS — R519 Headache, unspecified: Secondary | ICD-10-CM

## 2023-12-03 NOTE — Progress Notes (Signed)
 [facility] Clinic Note - Initial Visit   Date: 12/03/2023   Cassidy Green MRN: [id number] DOB: 1955/10/17   Dear Dr. Silver:  Thank you for your kind referral of PAISLY FINGERHUT for consultation of facial pain. Although her history is well known to you, please allow us  to reiterate it for the purpose of our medical record. The patient was accompanied to the clinic by self.   Cassidy Green is a 68 y.o.  female with hypertension, hyperlipidemia, diabetes mellitus, CAD s/p CABG, and CKD presenting for evaluation of facial pain.   IMPRESSION/PLAN: Facial pain, described as soreness involving the forehead and Corbello.  Her history is not suggestive of trigeminal neuralgia, trigeminal autonomic cephalgia, or migraine.  With her symptoms always responding to antibiotic treatment and decongestant, it suggests possibly inflammatory/infectious etiology.  From a neurological standpoint, I do not see that she has a primary neurological condition.  She is understandably frustrated by the lack of diagnosis and treatment.  She may want to seek a second opinion with ENT.  ------------------------------------------------------------- History of present illness: In November 2025, she developed URI and was treated with antibiotics.  Since this time, she has ongoing throbbing pain over forehead and cheeks which also involves up towards the right ear.  She saw ENT and dentist who did not find any reason for his pain.  She has relief with treatment with antibiotics.  She gets relief with tylenol  and decongestant (Norel) which she takes daily.  She denies sharp, stabbing, radiating, or shooting pain involving the face.  No headache, vision changes, or facial numbness/tingling.   Out-side paper records, electronic medical record, and images have been reviewed where available and summarized as:  Lab Results  Component Value Date   HGBA1C 7.6 (H) 06/12/2018    Past Medical History:   Diagnosis Date   Anemia    Anxiety    Arthritis    CAD (coronary artery disease)    S/P CABG 05/2018   Chronic kidney disease    Diabetes mellitus without complication (HCC)    type II   Dysrhythmia    post CABG   Female bladder prolapse    GERD (gastroesophageal reflux disease)    Headache    History of bronchitis    History of pneumonia    Hyperlipidemia    Hypertension    Joint inflammation    Pneumonia     Past Surgical History:  Procedure Laterality Date   ABDOMINAL HYSTERECTOMY  1992   COLONOSCOPY     CORONARY ARTERY BYPASS GRAFT N/A 06/14/2018   Procedure: CORONARY ARTERY BYPASS GRAFTING (CABG) x4  using left internal mammary artery and right greater saphenous vein harvested endoscopically.;  Surgeon: Kerrin Elspeth BROCKS, MD;  Location: [facility] OR;  Service: Open Heart Surgery;  Laterality: N/A;   INCISION AND DRAINAGE HIP Left 09/04/2014   Procedure: IRRIGATION AND DEBRIDEMENT  LEFT HIP WOUND WITH CLOSURE ;  Surgeon: Redell Shoals, MD;  Location: [facility] OR;  Service: Orthopedics;  Laterality: Left;   kidney stones     KNEE ARTHROPLASTY Right 05/28/2019   Procedure: COMPUTER ASSISTED TOTAL KNEE ARTHROPLASTY;  Surgeon: Shoals Redell, MD;  Location: [facility]S;  Service: Orthopedics;  Laterality: Right;   LEFT HEART CATH AND CORONARY ANGIOGRAPHY N/A 06/12/2018   Procedure: LEFT HEART CATH AND CORONARY ANGIOGRAPHY;  Surgeon: Burnard Debby LABOR, MD;  Location: [facility] INVASIVE CV LAB;  Service: Cardiovascular;  Laterality: N/A;   LUMBAR LAMINECTOMY/DECOMPRESSION MICRODISCECTOMY  03/11/2015   Procedure:  Lumbar Four-Five DECOMPRESSION  AND REMOVAL SYNOVAL CYST;  Surgeon: Donaciano Sprang, MD;  Location: [facility] OR;  Service: Orthopedics;;   TEE WITHOUT CARDIOVERSION N/A 06/14/2018   Procedure: TRANSESOPHAGEAL ECHOCARDIOGRAM (TEE);  Surgeon: Kerrin Elspeth BROCKS, MD;  Location: [facility] OR;  Service: Open Heart Surgery;  Laterality: N/A;   TOTAL HIP ARTHROPLASTY Left 07/30/2014   Procedure: LEFT TOTAL HIP  ARTHROPLASTY ANTERIOR APPROACH;  Surgeon: Redell Lynwood Shoals, MD;  Location: [facility];  Service: Orthopedics;  Laterality: Left;   TUBAL LIGATION     ULTRASOUND GUIDANCE FOR VASCULAR ACCESS  06/12/2018   Procedure: Ultrasound Guidance For Vascular Access;  Surgeon: Burnard Debby LABOR, MD;  Location: [facility]VAS[facility] CV LAB;  Service: Cardiovascular;;     Medications:  Outpatient Encounter Medications as of 12/03/2023  Medication Sig   amLODipine  (NORVASC ) 5 MG tablet Take 1 tablet (5 mg total) by mouth daily. Please keep upcoming appointment with Dr.Skains on2/21/25 to receive future refills.   amoxicillin  (AMOXIL ) 500 MG tablet SMARTSIG:4 Tablet(s) By Mouth   aspirin  EC 81 MG tablet Take 1 tablet (81 mg total) by mouth daily. Swallow whole.   buPROPion  (WELLBUTRIN ) 100 MG tablet Take 100 mg by mouth daily as needed (anxiety).    Chlorphen-PE-Acetaminophen  4-10-325 MG TABS Take 1 tablet by mouth 4 (four) times daily as needed.   Evolocumab  (REPATHA  SURECLICK) 140 MG/ML SOAJ INJECT 140MG  INTO THE SKIN EVERY 14 DAYS   ezetimibe  (ZETIA ) 10 MG tablet Take 1 tablet by mouth at bedtime   ferrous sulfate 325 (65 FE) MG EC tablet Take 325 mg by mouth daily.   fluticasone  (FLONASE ) 50 MCG/ACT nasal spray Place 1 spray into both nostrils daily.   glipiZIDE (GLUCOTROL XL) 5 MG 24 hr tablet Take 5 mg by mouth daily.   loratadine  (CLARITIN ) 10 MG tablet Take 1 tablet (10 mg total) by mouth daily.   metFORMIN  (GLUCOPHAGE ) 1000 MG tablet Take 1,000 mg by mouth 2 (two) times daily.   metoprolol  tartrate (LOPRESSOR ) 50 MG tablet TAKE ONE AND ONE-HALF TABLETS BY MOUTH TWICE A DAY   Omega-3 350 MG CPDR Take 1 tablet by mouth daily at 6 (six) AM.   omeprazole  (PRILOSEC) 40 MG capsule Take 1 capsule (40 mg total) by mouth daily.   sodium bicarbonate  650 MG tablet Take by mouth.   Facility-Administered Encounter Medications as of 12/03/2023  Medication   dexamethasone  (DECADRON ) injection 4 mg   ondansetron  (ZOFRAN ) 4 mg  in sodium chloride  0.9 % 50 mL IVPB    Allergies:  Allergies  Allergen Reactions   Statins Nausea And Vomiting and Other (See Comments)      I cant recall the name of the statin, but I had joint pain with one of the statins    Nsaids Other (See Comments)    CHRONIC KIDNEY DISEASE    Family History: No family history on file.  Social History: Social History   Tobacco Use   Smoking status: Former    Current packs/day: 0.00    Types: Cigarettes    Quit date: 02/26/2001    Years since quitting: 22.7   Smokeless tobacco: Never  Vaping Use   Vaping status: Never Used  Substance Use Topics   Alcohol  use: Yes    Comment: OCCASIONAL   Drug use: No   Social History   Social History Narrative   Not on file    Vital Signs:  BP 138/73   Pulse (!) 120   Ht 5' 2 (1.575 m)  Wt 132 lb (59.9 kg)   SpO2 95%   BMI 24.14 kg/m   Neurological Exam: MENTAL STATUS including orientation to time, place, person, recent and remote memory, attention span and concentration, language, and fund of knowledge is normal.  Speech is not dysarthric.  CRANIAL NERVES: II:  No visual field defects.     III-IV-VI: Pupils equal round and reactive to light.  Normal conjugate, extra-ocular eye movements in all directions of gaze.  No nystagmus.  No ptosis.   V:  Normal facial sensation.    VII:  Normal facial symmetry and movements.  She has tenderness to palpation over the supraorbital ridge, maxilla, and jaw  VIII:  Normal hearing and vestibular function.   IX-X:  Normal palatal movement.   XI:  Normal shoulder shrug and head rotation.   XII:  Normal tongue strength and range of motion, no deviation or fasciculation.  MOTOR:  Motor strength is 5/5 throughout. No atrophy, fasciculations or abnormal movements.  No pronator drift.   MSRs:                                           Right        Left brachioradialis 2+  2+  biceps 2+  2+  triceps 2+  2+  patellar 2+  2+  ankle jerk 2+  2+    SENSORY:  Normal and symmetric perception of light touch, vibration, and temperature.  COORDINATION/GAIT: Normal finger-to- nose-finger.  Intact rapid alternating movements bilaterally.  Gait narrow based and stable.    Thank you for allowing me to participate in patient's care.  If I can answer any additional questions, I would be pleased to do so.    Sincerely,    Koua Deeg K. Tobie, DO

## 2024-04-30 ENCOUNTER — Other Ambulatory Visit: Payer: Self-pay | Admitting: Cardiology

## 2024-06-17 ENCOUNTER — Telehealth: Payer: Self-pay | Admitting: Physician Assistant

## 2024-06-17 NOTE — Telephone Encounter (Signed)
 Pt states needs new grant for Repatha , please advise

## 2024-06-24 ENCOUNTER — Telehealth: Payer: Self-pay | Admitting: Pharmacy Technician

## 2024-06-24 ENCOUNTER — Other Ambulatory Visit (HOSPITAL_COMMUNITY): Payer: Self-pay

## 2024-06-24 NOTE — Telephone Encounter (Signed)
 Patient Advocate Encounter   The patient was approved for a [employer] grant that will help cover the cost of Repatha  Total amount awarded, 2500.00.  Effective: 05/25/24 - 05/24/25   APW:[id number] ERW:[id number] Hmnle:[id number] PI:[id number]  [employer] ID: [id number]   Pharmacy provided with approval and processing information. Patient informed via call

## 2024-07-02 ENCOUNTER — Other Ambulatory Visit: Payer: Self-pay | Admitting: Cardiology

## 2024-07-08 ENCOUNTER — Ambulatory Visit: Admitting: Cardiology

## 2024-07-09 ENCOUNTER — Ambulatory Visit: Admitting: Podiatry
